# Patient Record
Sex: Male | Born: 1935 | Race: Black or African American | Hispanic: No | Smoking: Former smoker
Health system: Southern US, Community
[De-identification: ages and names within clinical notes are randomized; demographics above are authoritative.]

## PROBLEM LIST (undated history)

## (undated) DIAGNOSIS — E119 Type 2 diabetes mellitus without complications: Secondary | ICD-10-CM

## (undated) DIAGNOSIS — C169 Malignant neoplasm of stomach, unspecified: Secondary | ICD-10-CM

## (undated) DIAGNOSIS — I6529 Occlusion and stenosis of unspecified carotid artery: Secondary | ICD-10-CM

## (undated) DIAGNOSIS — J449 Chronic obstructive pulmonary disease, unspecified: Secondary | ICD-10-CM

## (undated) DIAGNOSIS — I10 Essential (primary) hypertension: Secondary | ICD-10-CM

## (undated) DIAGNOSIS — R0902 Hypoxemia: Secondary | ICD-10-CM

## (undated) DIAGNOSIS — D649 Anemia, unspecified: Secondary | ICD-10-CM

## (undated) DIAGNOSIS — N183 Chronic kidney disease, stage 3 unspecified: Secondary | ICD-10-CM

---

## 2018-05-20 ENCOUNTER — Inpatient Hospital Stay
Admission: EM | Admit: 2018-05-20 | Discharge: 2018-06-29 | Disposition: A | Discharge status: Skilled Nursing Facility | Payer: Medicare Other | Source: Other Acute Inpatient Hospital | Attending: Internal Medicine | Admitting: Internal Medicine

## 2018-05-20 DIAGNOSIS — R0603 Acute respiratory distress: Secondary | ICD-10-CM

## 2018-05-20 DIAGNOSIS — R0689 Other abnormalities of breathing: Secondary | ICD-10-CM

## 2018-05-20 DIAGNOSIS — R079 Chest pain, unspecified: Secondary | ICD-10-CM

## 2018-05-20 DIAGNOSIS — J189 Pneumonia, unspecified organism: Secondary | ICD-10-CM

## 2018-05-20 DIAGNOSIS — R0989 Other specified symptoms and signs involving the circulatory and respiratory systems: Secondary | ICD-10-CM

## 2018-05-20 DIAGNOSIS — K567 Ileus, unspecified: Secondary | ICD-10-CM

## 2018-05-20 DIAGNOSIS — R112 Nausea with vomiting, unspecified: Secondary | ICD-10-CM

## 2018-05-20 DIAGNOSIS — T17908A Unspecified foreign body in respiratory tract, part unspecified causing other injury, initial encounter: Secondary | ICD-10-CM

## 2018-05-20 DIAGNOSIS — R140 Abdominal distension (gaseous): Secondary | ICD-10-CM

## 2018-05-21 LAB — CBC WITH DIFFERENTIAL/PLATELET
Abs Immature Granulocytes: 0.03 10*3/uL (ref 0.00–0.07)
Basophils Absolute: 0 10*3/uL (ref 0.0–0.1)
Basophils Relative: 0 %
Eosinophils Absolute: 0.1 10*3/uL (ref 0.0–0.5)
Eosinophils Relative: 2 %
HCT: 31.5 % — ABNORMAL LOW (ref 39.0–52.0)
Hemoglobin: 9.8 g/dL — ABNORMAL LOW (ref 13.0–17.0)
IMMATURE GRANULOCYTES: 1 %
Lymphocytes Relative: 13 %
Lymphs Abs: 0.9 10*3/uL (ref 0.7–4.0)
MCH: 29.3 pg (ref 26.0–34.0)
MCHC: 31.1 g/dL (ref 30.0–36.0)
MCV: 94.3 fL (ref 80.0–100.0)
Monocytes Absolute: 0.8 10*3/uL (ref 0.1–1.0)
Monocytes Relative: 13 %
NEUTROS PCT: 71 %
Neutro Abs: 4.7 10*3/uL (ref 1.7–7.7)
Platelets: 424 10*3/uL — ABNORMAL HIGH (ref 150–400)
RBC: 3.34 MIL/uL — AB (ref 4.22–5.81)
RDW: 15.3 % (ref 11.5–15.5)
WBC: 6.5 10*3/uL (ref 4.0–10.5)
nRBC: 0 % (ref 0.0–0.2)

## 2018-05-21 LAB — BASIC METABOLIC PANEL
ANION GAP: 7 (ref 5–15)
BUN: 32 mg/dL — ABNORMAL HIGH (ref 8–23)
CO2: 23 mmol/L (ref 22–32)
Calcium: 8.2 mg/dL — ABNORMAL LOW (ref 8.9–10.3)
Chloride: 113 mmol/L — ABNORMAL HIGH (ref 98–111)
Creatinine, Ser: 1.3 mg/dL — ABNORMAL HIGH (ref 0.61–1.24)
GFR calc Af Amer: 59 mL/min — ABNORMAL LOW (ref >60)
GFR calc non Af Amer: 51 mL/min — ABNORMAL LOW (ref >60)
Glucose, Bld: 239 mg/dL — ABNORMAL HIGH (ref 70–99)
Potassium: 4.2 mmol/L (ref 3.5–5.1)
SODIUM: 143 mmol/L (ref 135–145)

## 2018-05-22 LAB — BASIC METABOLIC PANEL
Anion gap: 8 (ref 5–15)
BUN: 29 mg/dL — ABNORMAL HIGH (ref 8–23)
CO2: 22 mmol/L (ref 22–32)
CREATININE: 1.2 mg/dL (ref 0.61–1.24)
Calcium: 8.1 mg/dL — ABNORMAL LOW (ref 8.9–10.3)
Chloride: 111 mmol/L (ref 98–111)
GFR calc Af Amer: >60 mL/min (ref >60)
GFR calc non Af Amer: 56 mL/min — ABNORMAL LOW (ref >60)
Glucose, Bld: 215 mg/dL — ABNORMAL HIGH (ref 70–99)
Potassium: 4.3 mmol/L (ref 3.5–5.1)
Sodium: 141 mmol/L (ref 135–145)

## 2018-05-22 LAB — TRIGLYCERIDES: Triglycerides: 53 mg/dL (ref <150)

## 2018-05-22 LAB — PHOSPHORUS: Phosphorus: 2.7 mg/dL (ref 2.5–4.6)

## 2018-05-22 LAB — MAGNESIUM: Magnesium: 2.2 mg/dL (ref 1.7–2.4)

## 2018-05-23 ENCOUNTER — Other Ambulatory Visit (HOSPITAL_COMMUNITY): Payer: Medicare Other

## 2018-05-25 LAB — BASIC METABOLIC PANEL
Anion gap: 7 (ref 5–15)
BUN: 24 mg/dL — ABNORMAL HIGH (ref 8–23)
CO2: 22 mmol/L (ref 22–32)
Calcium: 8.1 mg/dL — ABNORMAL LOW (ref 8.9–10.3)
Chloride: 109 mmol/L (ref 98–111)
Creatinine, Ser: 0.99 mg/dL (ref 0.61–1.24)
Glucose, Bld: 210 mg/dL — ABNORMAL HIGH (ref 70–99)
Potassium: 4.5 mmol/L (ref 3.5–5.1)
Sodium: 138 mmol/L (ref 135–145)

## 2018-05-25 LAB — CBC
HCT: 31.1 % — ABNORMAL LOW (ref 39.0–52.0)
Hemoglobin: 9.4 g/dL — ABNORMAL LOW (ref 13.0–17.0)
MCH: 28.1 pg (ref 26.0–34.0)
MCHC: 30.2 g/dL (ref 30.0–36.0)
MCV: 93.1 fL (ref 80.0–100.0)
PLATELETS: 272 10*3/uL (ref 150–400)
RBC: 3.34 MIL/uL — ABNORMAL LOW (ref 4.22–5.81)
RDW: 14.3 % (ref 11.5–15.5)
WBC: 4.4 10*3/uL (ref 4.0–10.5)
nRBC: 0 % (ref 0.0–0.2)

## 2018-05-25 LAB — TRIGLYCERIDES: Triglycerides: 72 mg/dL (ref <150)

## 2018-05-25 LAB — PHOSPHORUS: Phosphorus: 2.7 mg/dL (ref 2.5–4.6)

## 2018-05-25 LAB — MAGNESIUM: Magnesium: 1.9 mg/dL (ref 1.7–2.4)

## 2018-05-27 LAB — RENAL FUNCTION PANEL
ANION GAP: 5 (ref 5–15)
Albumin: 1.9 g/dL — ABNORMAL LOW (ref 3.5–5.0)
BUN: 27 mg/dL — ABNORMAL HIGH (ref 8–23)
CO2: 26 mmol/L (ref 22–32)
Calcium: 8.4 mg/dL — ABNORMAL LOW (ref 8.9–10.3)
Chloride: 105 mmol/L (ref 98–111)
Creatinine, Ser: 1.24 mg/dL (ref 0.61–1.24)
GFR calc Af Amer: >60 mL/min (ref >60)
GFR calc non Af Amer: 54 mL/min — ABNORMAL LOW (ref >60)
Glucose, Bld: 182 mg/dL — ABNORMAL HIGH (ref 70–99)
Phosphorus: 3.3 mg/dL (ref 2.5–4.6)
Potassium: 4.9 mmol/L (ref 3.5–5.1)
Sodium: 136 mmol/L (ref 135–145)

## 2018-05-27 LAB — CBC
HCT: 32.2 % — ABNORMAL LOW (ref 39.0–52.0)
Hemoglobin: 10 g/dL — ABNORMAL LOW (ref 13.0–17.0)
MCH: 29.1 pg (ref 26.0–34.0)
MCHC: 31.1 g/dL (ref 30.0–36.0)
MCV: 93.6 fL (ref 80.0–100.0)
Platelets: 219 10*3/uL (ref 150–400)
RBC: 3.44 MIL/uL — ABNORMAL LOW (ref 4.22–5.81)
RDW: 14.3 % (ref 11.5–15.5)
WBC: 5.1 10*3/uL (ref 4.0–10.5)
nRBC: 0 % (ref 0.0–0.2)

## 2018-05-27 LAB — MAGNESIUM: Magnesium: 1.9 mg/dL (ref 1.7–2.4)

## 2018-05-28 LAB — BASIC METABOLIC PANEL
Anion gap: 4 — ABNORMAL LOW (ref 5–15)
BUN: 25 mg/dL — ABNORMAL HIGH (ref 8–23)
CO2: 26 mmol/L (ref 22–32)
Calcium: 8.2 mg/dL — ABNORMAL LOW (ref 8.9–10.3)
Chloride: 105 mmol/L (ref 98–111)
Creatinine, Ser: 1.09 mg/dL (ref 0.61–1.24)
GFR calc Af Amer: >60 mL/min (ref >60)
GFR calc non Af Amer: >60 mL/min (ref >60)
Glucose, Bld: 125 mg/dL — ABNORMAL HIGH (ref 70–99)
Potassium: 4.2 mmol/L (ref 3.5–5.1)
Sodium: 135 mmol/L (ref 135–145)

## 2018-05-28 LAB — TRIGLYCERIDES: Triglycerides: 49 mg/dL (ref <150)

## 2018-05-28 LAB — PHOSPHORUS: Phosphorus: 3 mg/dL (ref 2.5–4.6)

## 2018-05-28 LAB — MAGNESIUM: Magnesium: 1.8 mg/dL (ref 1.7–2.4)

## 2018-05-30 ENCOUNTER — Other Ambulatory Visit (HOSPITAL_COMMUNITY): Payer: Medicare Other

## 2018-05-31 LAB — TRIGLYCERIDES: Triglycerides: 52 mg/dL (ref <150)

## 2018-05-31 LAB — BASIC METABOLIC PANEL
Anion gap: 6 (ref 5–15)
BUN: 24 mg/dL — ABNORMAL HIGH (ref 8–23)
CO2: 26 mmol/L (ref 22–32)
Calcium: 8.3 mg/dL — ABNORMAL LOW (ref 8.9–10.3)
Chloride: 103 mmol/L (ref 98–111)
Creatinine, Ser: 1.14 mg/dL (ref 0.61–1.24)
GFR calc Af Amer: >60 mL/min (ref >60)
GFR calc non Af Amer: 60 mL/min — ABNORMAL LOW (ref >60)
Glucose, Bld: 128 mg/dL — ABNORMAL HIGH (ref 70–99)
Potassium: 4.4 mmol/L (ref 3.5–5.1)
Sodium: 135 mmol/L (ref 135–145)

## 2018-05-31 LAB — MAGNESIUM: Magnesium: 1.8 mg/dL (ref 1.7–2.4)

## 2018-05-31 LAB — PHOSPHORUS: Phosphorus: 3.3 mg/dL (ref 2.5–4.6)

## 2018-06-01 ENCOUNTER — Other Ambulatory Visit (HOSPITAL_COMMUNITY): Payer: Medicare Other

## 2018-06-01 LAB — URINALYSIS, ROUTINE W REFLEX MICROSCOPIC
Bilirubin Urine: NEGATIVE
Glucose, UA: NEGATIVE mg/dL
Hgb urine dipstick: NEGATIVE
Ketones, ur: NEGATIVE mg/dL
Leukocytes,Ua: NEGATIVE
Nitrite: NEGATIVE
Protein, ur: NEGATIVE mg/dL
Specific Gravity, Urine: 1.012 (ref 1.005–1.030)
pH: 5 (ref 5.0–8.0)

## 2018-06-02 LAB — COMPREHENSIVE METABOLIC PANEL
ALT: 26 U/L (ref 0–44)
AST: 24 U/L (ref 15–41)
Albumin: 1.8 g/dL — ABNORMAL LOW (ref 3.5–5.0)
Alkaline Phosphatase: 103 U/L (ref 38–126)
Anion gap: 8 (ref 5–15)
BUN: 23 mg/dL (ref 8–23)
CO2: 25 mmol/L (ref 22–32)
Calcium: 8.2 mg/dL — ABNORMAL LOW (ref 8.9–10.3)
Chloride: 103 mmol/L (ref 98–111)
Creatinine, Ser: 1.23 mg/dL (ref 0.61–1.24)
GFR calc Af Amer: >60 mL/min (ref >60)
GFR calc non Af Amer: 55 mL/min — ABNORMAL LOW (ref >60)
Glucose, Bld: 202 mg/dL — ABNORMAL HIGH (ref 70–99)
Potassium: 4.5 mmol/L (ref 3.5–5.1)
Sodium: 136 mmol/L (ref 135–145)
Total Bilirubin: 0.1 mg/dL — ABNORMAL LOW (ref 0.3–1.2)
Total Protein: 6 g/dL — ABNORMAL LOW (ref 6.5–8.1)

## 2018-06-02 LAB — CBC
HCT: 31.9 % — ABNORMAL LOW (ref 39.0–52.0)
Hemoglobin: 9.7 g/dL — ABNORMAL LOW (ref 13.0–17.0)
MCH: 28 pg (ref 26.0–34.0)
MCHC: 30.4 g/dL (ref 30.0–36.0)
MCV: 92.2 fL (ref 80.0–100.0)
Platelets: 196 10*3/uL (ref 150–400)
RBC: 3.46 MIL/uL — ABNORMAL LOW (ref 4.22–5.81)
RDW: 13.9 % (ref 11.5–15.5)
WBC: 5.5 10*3/uL (ref 4.0–10.5)
nRBC: 0 % (ref 0.0–0.2)

## 2018-06-03 LAB — BASIC METABOLIC PANEL
Anion gap: 7 (ref 5–15)
BUN: 26 mg/dL — ABNORMAL HIGH (ref 8–23)
CO2: 25 mmol/L (ref 22–32)
Calcium: 8.2 mg/dL — ABNORMAL LOW (ref 8.9–10.3)
Chloride: 103 mmol/L (ref 98–111)
Creatinine, Ser: 1.12 mg/dL (ref 0.61–1.24)
GFR calc Af Amer: >60 mL/min (ref >60)
GFR calc non Af Amer: >60 mL/min (ref >60)
Glucose, Bld: 153 mg/dL — ABNORMAL HIGH (ref 70–99)
Potassium: 4.1 mmol/L (ref 3.5–5.1)
Sodium: 135 mmol/L (ref 135–145)

## 2018-06-03 LAB — URINE CULTURE: Culture: >=100000 — AB

## 2018-06-03 LAB — MAGNESIUM: Magnesium: 1.8 mg/dL (ref 1.7–2.4)

## 2018-06-03 LAB — PHOSPHORUS: Phosphorus: 2.6 mg/dL (ref 2.5–4.6)

## 2018-06-03 LAB — TRIGLYCERIDES: Triglycerides: 65 mg/dL (ref <150)

## 2018-06-05 LAB — CBC
HCT: 32.9 % — ABNORMAL LOW (ref 39.0–52.0)
Hemoglobin: 10 g/dL — ABNORMAL LOW (ref 13.0–17.0)
MCH: 28.1 pg (ref 26.0–34.0)
MCHC: 30.4 g/dL (ref 30.0–36.0)
MCV: 92.4 fL (ref 80.0–100.0)
Platelets: 305 10*3/uL (ref 150–400)
RBC: 3.56 MIL/uL — ABNORMAL LOW (ref 4.22–5.81)
RDW: 13.5 % (ref 11.5–15.5)
WBC: 8.2 10*3/uL (ref 4.0–10.5)
nRBC: 0 % (ref 0.0–0.2)

## 2018-06-06 ENCOUNTER — Other Ambulatory Visit (HOSPITAL_COMMUNITY): Payer: Medicare Other

## 2018-06-06 LAB — MAGNESIUM: Magnesium: 2 mg/dL (ref 1.7–2.4)

## 2018-06-06 LAB — BASIC METABOLIC PANEL
Anion gap: 8 (ref 5–15)
BUN: 22 mg/dL (ref 8–23)
CO2: 26 mmol/L (ref 22–32)
Calcium: 8.5 mg/dL — ABNORMAL LOW (ref 8.9–10.3)
Chloride: 101 mmol/L (ref 98–111)
Creatinine, Ser: 1.09 mg/dL (ref 0.61–1.24)
GFR calc Af Amer: >60 mL/min (ref >60)
GFR calc non Af Amer: >60 mL/min (ref >60)
Glucose, Bld: 282 mg/dL — ABNORMAL HIGH (ref 70–99)
Potassium: 4.6 mmol/L (ref 3.5–5.1)
Sodium: 135 mmol/L (ref 135–145)

## 2018-06-06 LAB — TRIGLYCERIDES: Triglycerides: 62 mg/dL (ref <150)

## 2018-06-06 LAB — PHOSPHORUS: Phosphorus: 2.6 mg/dL (ref 2.5–4.6)

## 2018-06-07 LAB — CBC
HCT: 30 % — ABNORMAL LOW (ref 39.0–52.0)
Hemoglobin: 9.1 g/dL — ABNORMAL LOW (ref 13.0–17.0)
MCH: 27.7 pg (ref 26.0–34.0)
MCHC: 30.3 g/dL (ref 30.0–36.0)
MCV: 91.2 fL (ref 80.0–100.0)
Platelets: 346 10*3/uL (ref 150–400)
RBC: 3.29 MIL/uL — ABNORMAL LOW (ref 4.22–5.81)
RDW: 13.9 % (ref 11.5–15.5)
WBC: 11.5 10*3/uL — ABNORMAL HIGH (ref 4.0–10.5)
nRBC: 0 % (ref 0.0–0.2)

## 2018-06-07 LAB — COMPREHENSIVE METABOLIC PANEL
ALT: 26 U/L (ref 0–44)
AST: 48 U/L — ABNORMAL HIGH (ref 15–41)
Albumin: 1.8 g/dL — ABNORMAL LOW (ref 3.5–5.0)
Alkaline Phosphatase: 108 U/L (ref 38–126)
Anion gap: 8 (ref 5–15)
BUN: 28 mg/dL — ABNORMAL HIGH (ref 8–23)
CO2: 25 mmol/L (ref 22–32)
Calcium: 8.3 mg/dL — ABNORMAL LOW (ref 8.9–10.3)
Chloride: 104 mmol/L (ref 98–111)
Creatinine, Ser: 1.14 mg/dL (ref 0.61–1.24)
GFR calc Af Amer: >60 mL/min (ref >60)
GFR calc non Af Amer: 60 mL/min — ABNORMAL LOW (ref >60)
Glucose, Bld: 206 mg/dL — ABNORMAL HIGH (ref 70–99)
Potassium: 4.8 mmol/L (ref 3.5–5.1)
Sodium: 137 mmol/L (ref 135–145)
Total Bilirubin: 0.9 mg/dL (ref 0.3–1.2)
Total Protein: 6.1 g/dL — ABNORMAL LOW (ref 6.5–8.1)

## 2018-06-07 LAB — MAGNESIUM: Magnesium: 1.9 mg/dL (ref 1.7–2.4)

## 2018-06-08 ENCOUNTER — Other Ambulatory Visit (HOSPITAL_COMMUNITY): Payer: Medicare Other

## 2018-06-08 LAB — CULTURE, RESPIRATORY W GRAM STAIN

## 2018-06-09 LAB — TRIGLYCERIDES: Triglycerides: 140 mg/dL (ref <150)

## 2018-06-10 LAB — BASIC METABOLIC PANEL
Anion gap: 6 (ref 5–15)
BUN: 33 mg/dL — ABNORMAL HIGH (ref 8–23)
CO2: 25 mmol/L (ref 22–32)
Calcium: 8.2 mg/dL — ABNORMAL LOW (ref 8.9–10.3)
Chloride: 110 mmol/L (ref 98–111)
Creatinine, Ser: 1.06 mg/dL (ref 0.61–1.24)
GFR calc Af Amer: >60 mL/min (ref >60)
GFR calc non Af Amer: >60 mL/min (ref >60)
Glucose, Bld: 236 mg/dL — ABNORMAL HIGH (ref 70–99)
Potassium: 4.4 mmol/L (ref 3.5–5.1)
Sodium: 141 mmol/L (ref 135–145)

## 2018-06-10 LAB — CBC
HCT: 23.7 % — ABNORMAL LOW (ref 39.0–52.0)
Hemoglobin: 7.3 g/dL — ABNORMAL LOW (ref 13.0–17.0)
MCH: 27.9 pg (ref 26.0–34.0)
MCHC: 30.8 g/dL (ref 30.0–36.0)
MCV: 90.5 fL (ref 80.0–100.0)
Platelets: 360 10*3/uL (ref 150–400)
RBC: 2.62 MIL/uL — ABNORMAL LOW (ref 4.22–5.81)
RDW: 14.5 % (ref 11.5–15.5)
WBC: 11.5 10*3/uL — ABNORMAL HIGH (ref 4.0–10.5)
nRBC: 0 % (ref 0.0–0.2)

## 2018-06-10 LAB — MAGNESIUM: Magnesium: 2 mg/dL (ref 1.7–2.4)

## 2018-06-10 LAB — PHOSPHORUS: Phosphorus: 2.5 mg/dL (ref 2.5–4.6)

## 2018-06-11 LAB — CBC
HCT: 23.2 % — ABNORMAL LOW (ref 39.0–52.0)
Hemoglobin: 7.4 g/dL — ABNORMAL LOW (ref 13.0–17.0)
MCH: 28.9 pg (ref 26.0–34.0)
MCHC: 31.9 g/dL (ref 30.0–36.0)
MCV: 90.6 fL (ref 80.0–100.0)
Platelets: 362 10*3/uL (ref 150–400)
RBC: 2.56 MIL/uL — ABNORMAL LOW (ref 4.22–5.81)
RDW: 14.8 % (ref 11.5–15.5)
WBC: 11.2 10*3/uL — ABNORMAL HIGH (ref 4.0–10.5)
nRBC: 0.4 % — ABNORMAL HIGH (ref 0.0–0.2)

## 2018-06-12 LAB — COMPREHENSIVE METABOLIC PANEL
ALT: 85 U/L — ABNORMAL HIGH (ref 0–44)
AST: 49 U/L — ABNORMAL HIGH (ref 15–41)
Albumin: 1.5 g/dL — ABNORMAL LOW (ref 3.5–5.0)
Alkaline Phosphatase: 233 U/L — ABNORMAL HIGH (ref 38–126)
Anion gap: 10 (ref 5–15)
BUN: 33 mg/dL — ABNORMAL HIGH (ref 8–23)
CO2: 22 mmol/L (ref 22–32)
Calcium: 8.6 mg/dL — ABNORMAL LOW (ref 8.9–10.3)
Chloride: 106 mmol/L (ref 98–111)
Creatinine, Ser: 1.19 mg/dL (ref 0.61–1.24)
GFR calc Af Amer: >60 mL/min (ref >60)
GFR calc non Af Amer: 57 mL/min — ABNORMAL LOW (ref >60)
Glucose, Bld: 354 mg/dL — ABNORMAL HIGH (ref 70–99)
Potassium: 4 mmol/L (ref 3.5–5.1)
Sodium: 138 mmol/L (ref 135–145)
Total Bilirubin: 0.4 mg/dL (ref 0.3–1.2)
Total Protein: 6.3 g/dL — ABNORMAL LOW (ref 6.5–8.1)

## 2018-06-12 LAB — CBC
HCT: 24.2 % — ABNORMAL LOW (ref 39.0–52.0)
Hemoglobin: 7.4 g/dL — ABNORMAL LOW (ref 13.0–17.0)
MCH: 27.5 pg (ref 26.0–34.0)
MCHC: 30.6 g/dL (ref 30.0–36.0)
MCV: 90 fL (ref 80.0–100.0)
Platelets: 392 10*3/uL (ref 150–400)
RBC: 2.69 MIL/uL — ABNORMAL LOW (ref 4.22–5.81)
RDW: 15 % (ref 11.5–15.5)
WBC: 10.6 10*3/uL — ABNORMAL HIGH (ref 4.0–10.5)
nRBC: 0.4 % — ABNORMAL HIGH (ref 0.0–0.2)

## 2018-06-12 LAB — MAGNESIUM: Magnesium: 2 mg/dL (ref 1.7–2.4)

## 2018-06-12 LAB — PHOSPHORUS: Phosphorus: 2.6 mg/dL (ref 2.5–4.6)

## 2018-06-14 ENCOUNTER — Other Ambulatory Visit (HOSPITAL_COMMUNITY): Payer: Medicare Other

## 2018-06-14 LAB — BLOOD GAS, ARTERIAL
Acid-Base Excess: 1.7 mmol/L (ref 0.0–2.0)
Bicarbonate: 26.1 mmol/L (ref 20.0–28.0)
O2 Content: 6 L/min
O2 Saturation: 96.3 %
Patient temperature: 96.1
pCO2 arterial: 40.6 mmHg (ref 32.0–48.0)
pH, Arterial: 7.417 (ref 7.350–7.450)
pO2, Arterial: 79.2 mmHg — ABNORMAL LOW (ref 83.0–108.0)

## 2018-06-16 LAB — BASIC METABOLIC PANEL
Anion gap: 9 (ref 5–15)
BUN: 39 mg/dL — ABNORMAL HIGH (ref 8–23)
CO2: 27 mmol/L (ref 22–32)
Calcium: 8.9 mg/dL (ref 8.9–10.3)
Chloride: 105 mmol/L (ref 98–111)
Creatinine, Ser: 1.19 mg/dL (ref 0.61–1.24)
GFR calc Af Amer: >60 mL/min (ref >60)
GFR calc non Af Amer: 57 mL/min — ABNORMAL LOW (ref >60)
Glucose, Bld: 244 mg/dL — ABNORMAL HIGH (ref 70–99)
Potassium: 4.6 mmol/L (ref 3.5–5.1)
Sodium: 141 mmol/L (ref 135–145)

## 2018-06-16 LAB — CBC
HCT: 24 % — ABNORMAL LOW (ref 39.0–52.0)
Hemoglobin: 7.2 g/dL — ABNORMAL LOW (ref 13.0–17.0)
MCH: 27.4 pg (ref 26.0–34.0)
MCHC: 30 g/dL (ref 30.0–36.0)
MCV: 91.3 fL (ref 80.0–100.0)
Platelets: 330 10*3/uL (ref 150–400)
RBC: 2.63 MIL/uL — ABNORMAL LOW (ref 4.22–5.81)
RDW: 15.8 % — ABNORMAL HIGH (ref 11.5–15.5)
WBC: 8.1 10*3/uL (ref 4.0–10.5)
nRBC: 1 % — ABNORMAL HIGH (ref 0.0–0.2)

## 2018-06-16 LAB — MAGNESIUM: Magnesium: 1.8 mg/dL (ref 1.7–2.4)

## 2018-06-18 ENCOUNTER — Other Ambulatory Visit (HOSPITAL_COMMUNITY): Payer: Medicare Other

## 2018-06-18 LAB — BASIC METABOLIC PANEL
Anion gap: 9 (ref 5–15)
BUN: 45 mg/dL — ABNORMAL HIGH (ref 8–23)
CO2: 29 mmol/L (ref 22–32)
Calcium: 8.9 mg/dL (ref 8.9–10.3)
Chloride: 102 mmol/L (ref 98–111)
Creatinine, Ser: 1.29 mg/dL — ABNORMAL HIGH (ref 0.61–1.24)
GFR calc Af Amer: 60 mL/min — ABNORMAL LOW (ref >60)
GFR calc non Af Amer: 52 mL/min — ABNORMAL LOW (ref >60)
Glucose, Bld: 387 mg/dL — ABNORMAL HIGH (ref 70–99)
Potassium: 4.8 mmol/L (ref 3.5–5.1)
Sodium: 140 mmol/L (ref 135–145)

## 2018-06-18 LAB — CBC
HCT: 22.4 % — ABNORMAL LOW (ref 39.0–52.0)
Hemoglobin: 6.9 g/dL — CL (ref 13.0–17.0)
MCH: 28 pg (ref 26.0–34.0)
MCHC: 30.8 g/dL (ref 30.0–36.0)
MCV: 91.1 fL (ref 80.0–100.0)
Platelets: 303 10*3/uL (ref 150–400)
RBC: 2.46 MIL/uL — ABNORMAL LOW (ref 4.22–5.81)
RDW: 16.1 % — ABNORMAL HIGH (ref 11.5–15.5)
WBC: 8.1 10*3/uL (ref 4.0–10.5)
nRBC: 0.9 % — ABNORMAL HIGH (ref 0.0–0.2)

## 2018-06-18 LAB — PREPARE RBC (CROSSMATCH)

## 2018-06-18 LAB — ABO/RH: ABO/RH(D): O POS

## 2018-06-19 LAB — CBC
HCT: 27.5 % — ABNORMAL LOW (ref 39.0–52.0)
Hemoglobin: 8.6 g/dL — ABNORMAL LOW (ref 13.0–17.0)
MCH: 28 pg (ref 26.0–34.0)
MCHC: 31.3 g/dL (ref 30.0–36.0)
MCV: 89.6 fL (ref 80.0–100.0)
Platelets: 297 10*3/uL (ref 150–400)
RBC: 3.07 MIL/uL — ABNORMAL LOW (ref 4.22–5.81)
RDW: 15.7 % — ABNORMAL HIGH (ref 11.5–15.5)
WBC: 9.3 10*3/uL (ref 4.0–10.5)
nRBC: 0.6 % — ABNORMAL HIGH (ref 0.0–0.2)

## 2018-06-19 LAB — BASIC METABOLIC PANEL
Anion gap: 7 (ref 5–15)
BUN: 43 mg/dL — ABNORMAL HIGH (ref 8–23)
CO2: 31 mmol/L (ref 22–32)
Calcium: 9.1 mg/dL (ref 8.9–10.3)
Chloride: 107 mmol/L (ref 98–111)
Creatinine, Ser: 1.21 mg/dL (ref 0.61–1.24)
GFR calc Af Amer: >60 mL/min (ref >60)
GFR calc non Af Amer: 56 mL/min — ABNORMAL LOW (ref >60)
Glucose, Bld: 116 mg/dL — ABNORMAL HIGH (ref 70–99)
Potassium: 4.7 mmol/L (ref 3.5–5.1)
Sodium: 145 mmol/L (ref 135–145)

## 2018-06-19 LAB — TYPE AND SCREEN
ABO/RH(D): O POS
Antibody Screen: NEGATIVE
Unit division: 0

## 2018-06-19 LAB — BPAM RBC
Blood Product Expiration Date: 202004302359
ISSUE DATE / TIME: 202004231100
Unit Type and Rh: 5100

## 2018-06-20 LAB — OCCULT BLOOD X 1 CARD TO LAB, STOOL: Fecal Occult Bld: NEGATIVE

## 2018-06-22 LAB — BASIC METABOLIC PANEL
Anion gap: 7 (ref 5–15)
BUN: 32 mg/dL — ABNORMAL HIGH (ref 8–23)
CO2: 30 mmol/L (ref 22–32)
Calcium: 8.6 mg/dL — ABNORMAL LOW (ref 8.9–10.3)
Chloride: 102 mmol/L (ref 98–111)
Creatinine, Ser: 1.1 mg/dL (ref 0.61–1.24)
GFR calc Af Amer: >60 mL/min (ref >60)
GFR calc non Af Amer: >60 mL/min (ref >60)
Glucose, Bld: 149 mg/dL — ABNORMAL HIGH (ref 70–99)
Potassium: 4.7 mmol/L (ref 3.5–5.1)
Sodium: 139 mmol/L (ref 135–145)

## 2018-06-22 LAB — CBC
HCT: 28.2 % — ABNORMAL LOW (ref 39.0–52.0)
Hemoglobin: 8.6 g/dL — ABNORMAL LOW (ref 13.0–17.0)
MCH: 27.6 pg (ref 26.0–34.0)
MCHC: 30.5 g/dL (ref 30.0–36.0)
MCV: 90.4 fL (ref 80.0–100.0)
Platelets: 273 10*3/uL (ref 150–400)
RBC: 3.12 MIL/uL — ABNORMAL LOW (ref 4.22–5.81)
RDW: 14.6 % (ref 11.5–15.5)
WBC: 8.6 10*3/uL (ref 4.0–10.5)
nRBC: 0 % (ref 0.0–0.2)

## 2018-06-22 LAB — MAGNESIUM: Magnesium: 1.8 mg/dL (ref 1.7–2.4)

## 2018-06-23 LAB — COMPREHENSIVE METABOLIC PANEL
ALT: 63 U/L — ABNORMAL HIGH (ref 0–44)
AST: 34 U/L (ref 15–41)
Albumin: 1.8 g/dL — ABNORMAL LOW (ref 3.5–5.0)
Alkaline Phosphatase: 217 U/L — ABNORMAL HIGH (ref 38–126)
Anion gap: 8 (ref 5–15)
BUN: 27 mg/dL — ABNORMAL HIGH (ref 8–23)
CO2: 30 mmol/L (ref 22–32)
Calcium: 9 mg/dL (ref 8.9–10.3)
Chloride: 99 mmol/L (ref 98–111)
Creatinine, Ser: 1.01 mg/dL (ref 0.61–1.24)
GFR calc Af Amer: >60 mL/min (ref >60)
GFR calc non Af Amer: >60 mL/min (ref >60)
Glucose, Bld: 169 mg/dL — ABNORMAL HIGH (ref 70–99)
Potassium: 4.8 mmol/L (ref 3.5–5.1)
Sodium: 137 mmol/L (ref 135–145)
Total Bilirubin: 0.5 mg/dL (ref 0.3–1.2)
Total Protein: 7.3 g/dL (ref 6.5–8.1)

## 2018-06-24 LAB — CBC
HCT: 30.4 % — ABNORMAL LOW (ref 39.0–52.0)
Hemoglobin: 9.4 g/dL — ABNORMAL LOW (ref 13.0–17.0)
MCH: 27.9 pg (ref 26.0–34.0)
MCHC: 30.9 g/dL (ref 30.0–36.0)
MCV: 90.2 fL (ref 80.0–100.0)
Platelets: 318 10*3/uL (ref 150–400)
RBC: 3.37 MIL/uL — ABNORMAL LOW (ref 4.22–5.81)
RDW: 14.4 % (ref 11.5–15.5)
WBC: 9.3 10*3/uL (ref 4.0–10.5)
nRBC: 0 % (ref 0.0–0.2)

## 2018-06-24 LAB — LIPID PANEL
Cholesterol: 160 mg/dL (ref 0–200)
HDL: 19 mg/dL — ABNORMAL LOW (ref >40)
LDL Cholesterol: 125 mg/dL — ABNORMAL HIGH (ref 0–99)
Total CHOL/HDL Ratio: 8.4 RATIO
Triglycerides: 80 mg/dL (ref <150)
VLDL: 16 mg/dL (ref 0–40)

## 2018-06-24 LAB — MAGNESIUM: Magnesium: 1.8 mg/dL (ref 1.7–2.4)

## 2018-06-24 LAB — PHOSPHORUS: Phosphorus: 3.6 mg/dL (ref 2.5–4.6)

## 2018-06-26 ENCOUNTER — Other Ambulatory Visit (HOSPITAL_COMMUNITY): Payer: Medicare Other

## 2018-06-26 LAB — TROPONIN I: Troponin I: <0.03 ng/mL (ref <0.03)

## 2018-06-26 LAB — BASIC METABOLIC PANEL
Anion gap: 8 (ref 5–15)
BUN: 31 mg/dL — ABNORMAL HIGH (ref 8–23)
CO2: 28 mmol/L (ref 22–32)
Calcium: 9 mg/dL (ref 8.9–10.3)
Chloride: 100 mmol/L (ref 98–111)
Creatinine, Ser: 0.96 mg/dL (ref 0.61–1.24)
GFR calc Af Amer: >60 mL/min (ref >60)
GFR calc non Af Amer: >60 mL/min (ref >60)
Glucose, Bld: 276 mg/dL — ABNORMAL HIGH (ref 70–99)
Potassium: 5 mmol/L (ref 3.5–5.1)
Sodium: 136 mmol/L (ref 135–145)

## 2018-06-26 LAB — CK TOTAL AND CKMB (NOT AT ARMC)
CK, MB: 2.3 ng/mL (ref 0.5–5.0)
Relative Index: INVALID (ref 0.0–2.5)
Total CK: 31 U/L — ABNORMAL LOW (ref 49–397)

## 2018-06-28 LAB — MAGNESIUM: Magnesium: 1.6 mg/dL — ABNORMAL LOW (ref 1.7–2.4)

## 2018-06-29 LAB — CBC
HCT: 27.7 % — ABNORMAL LOW (ref 39.0–52.0)
Hemoglobin: 8.5 g/dL — ABNORMAL LOW (ref 13.0–17.0)
MCH: 27.3 pg (ref 26.0–34.0)
MCHC: 30.7 g/dL (ref 30.0–36.0)
MCV: 89.1 fL (ref 80.0–100.0)
Platelets: 396 10*3/uL (ref 150–400)
RBC: 3.11 MIL/uL — ABNORMAL LOW (ref 4.22–5.81)
RDW: 14.3 % (ref 11.5–15.5)
WBC: 6.8 10*3/uL (ref 4.0–10.5)
nRBC: 0 % (ref 0.0–0.2)

## 2018-06-29 LAB — MAGNESIUM: Magnesium: 2.1 mg/dL (ref 1.7–2.4)

## 2018-06-29 LAB — BASIC METABOLIC PANEL
Anion gap: 6 (ref 5–15)
BUN: 30 mg/dL — ABNORMAL HIGH (ref 8–23)
CO2: 29 mmol/L (ref 22–32)
Calcium: 8.7 mg/dL — ABNORMAL LOW (ref 8.9–10.3)
Chloride: 101 mmol/L (ref 98–111)
Creatinine, Ser: 0.96 mg/dL (ref 0.61–1.24)
GFR calc Af Amer: >60 mL/min (ref >60)
GFR calc non Af Amer: >60 mL/min (ref >60)
Glucose, Bld: 118 mg/dL — ABNORMAL HIGH (ref 70–99)
Potassium: 4.4 mmol/L (ref 3.5–5.1)
Sodium: 136 mmol/L (ref 135–145)

## 2018-06-29 LAB — PHOSPHORUS: Phosphorus: 3.8 mg/dL (ref 2.5–4.6)

## 2018-07-16 ENCOUNTER — Emergency Department (HOSPITAL_COMMUNITY): Payer: Medicare Other

## 2018-07-16 ENCOUNTER — Inpatient Hospital Stay (HOSPITAL_COMMUNITY)
Admission: EM | Admit: 2018-07-16 | Discharge: 2018-07-21 | DRG: 871 | Disposition: A | Discharge status: Skilled Nursing Facility | Payer: Medicare Other | Attending: Internal Medicine | Admitting: Internal Medicine

## 2018-07-16 ENCOUNTER — Encounter (HOSPITAL_COMMUNITY): Payer: Self-pay | Admitting: Emergency Medicine

## 2018-07-16 ENCOUNTER — Other Ambulatory Visit: Payer: Self-pay

## 2018-07-16 DIAGNOSIS — R739 Hyperglycemia, unspecified: Secondary | ICD-10-CM

## 2018-07-16 DIAGNOSIS — I129 Hypertensive chronic kidney disease with stage 1 through stage 4 chronic kidney disease, or unspecified chronic kidney disease: Secondary | ICD-10-CM | POA: Diagnosis present

## 2018-07-16 DIAGNOSIS — J440 Chronic obstructive pulmonary disease with acute lower respiratory infection: Secondary | ICD-10-CM | POA: Diagnosis present

## 2018-07-16 DIAGNOSIS — I10 Essential (primary) hypertension: Secondary | ICD-10-CM | POA: Diagnosis present

## 2018-07-16 DIAGNOSIS — G9341 Metabolic encephalopathy: Secondary | ICD-10-CM | POA: Diagnosis present

## 2018-07-16 DIAGNOSIS — Z7189 Other specified counseling: Secondary | ICD-10-CM | POA: Diagnosis not present

## 2018-07-16 DIAGNOSIS — I472 Ventricular tachycardia: Secondary | ICD-10-CM | POA: Diagnosis present

## 2018-07-16 DIAGNOSIS — Z85028 Personal history of other malignant neoplasm of stomach: Secondary | ICD-10-CM | POA: Diagnosis not present

## 2018-07-16 DIAGNOSIS — R4182 Altered mental status, unspecified: Secondary | ICD-10-CM

## 2018-07-16 DIAGNOSIS — Z20828 Contact with and (suspected) exposure to other viral communicable diseases: Secondary | ICD-10-CM | POA: Diagnosis present

## 2018-07-16 DIAGNOSIS — Z888 Allergy status to other drugs, medicaments and biological substances status: Secondary | ICD-10-CM | POA: Diagnosis not present

## 2018-07-16 DIAGNOSIS — J9621 Acute and chronic respiratory failure with hypoxia: Secondary | ICD-10-CM | POA: Diagnosis present

## 2018-07-16 DIAGNOSIS — E1122 Type 2 diabetes mellitus with diabetic chronic kidney disease: Secondary | ICD-10-CM | POA: Diagnosis present

## 2018-07-16 DIAGNOSIS — Z903 Acquired absence of stomach [part of]: Secondary | ICD-10-CM | POA: Diagnosis not present

## 2018-07-16 DIAGNOSIS — R0600 Dyspnea, unspecified: Secondary | ICD-10-CM

## 2018-07-16 DIAGNOSIS — J441 Chronic obstructive pulmonary disease with (acute) exacerbation: Secondary | ICD-10-CM | POA: Diagnosis present

## 2018-07-16 DIAGNOSIS — N183 Chronic kidney disease, stage 3 unspecified: Secondary | ICD-10-CM | POA: Diagnosis present

## 2018-07-16 DIAGNOSIS — E1165 Type 2 diabetes mellitus with hyperglycemia: Secondary | ICD-10-CM | POA: Diagnosis present

## 2018-07-16 DIAGNOSIS — J189 Pneumonia, unspecified organism: Secondary | ICD-10-CM | POA: Diagnosis present

## 2018-07-16 DIAGNOSIS — A419 Sepsis, unspecified organism: Principal | ICD-10-CM

## 2018-07-16 DIAGNOSIS — I4729 Other ventricular tachycardia: Secondary | ICD-10-CM

## 2018-07-16 DIAGNOSIS — R0602 Shortness of breath: Secondary | ICD-10-CM | POA: Diagnosis present

## 2018-07-16 DIAGNOSIS — R7881 Bacteremia: Secondary | ICD-10-CM | POA: Diagnosis not present

## 2018-07-16 DIAGNOSIS — Z794 Long term (current) use of insulin: Secondary | ICD-10-CM

## 2018-07-16 DIAGNOSIS — Z91041 Radiographic dye allergy status: Secondary | ICD-10-CM

## 2018-07-16 DIAGNOSIS — J439 Emphysema, unspecified: Secondary | ICD-10-CM | POA: Insufficient documentation

## 2018-07-16 DIAGNOSIS — Z931 Gastrostomy status: Secondary | ICD-10-CM | POA: Diagnosis not present

## 2018-07-16 DIAGNOSIS — Z9981 Dependence on supplemental oxygen: Secondary | ICD-10-CM

## 2018-07-16 DIAGNOSIS — R0603 Acute respiratory distress: Secondary | ICD-10-CM

## 2018-07-16 DIAGNOSIS — Z515 Encounter for palliative care: Secondary | ICD-10-CM | POA: Diagnosis not present

## 2018-07-16 HISTORY — DX: Hypoxemia: R09.02

## 2018-07-16 HISTORY — DX: Type 2 diabetes mellitus without complications: E11.9

## 2018-07-16 HISTORY — DX: Anemia, unspecified: D64.9

## 2018-07-16 HISTORY — DX: Occlusion and stenosis of unspecified carotid artery: I65.29

## 2018-07-16 HISTORY — DX: Essential (primary) hypertension: I10

## 2018-07-16 HISTORY — DX: Malignant neoplasm of stomach, unspecified: C16.9

## 2018-07-16 HISTORY — DX: Chronic obstructive pulmonary disease, unspecified: J44.9

## 2018-07-16 HISTORY — DX: Chronic kidney disease, stage 3 unspecified: N18.30

## 2018-07-16 LAB — POCT I-STAT 7, (LYTES, BLD GAS, ICA,H+H)
Acid-Base Excess: 1 mmol/L (ref 0.0–2.0)
Bicarbonate: 27.5 mmol/L (ref 20.0–28.0)
Calcium, Ion: 1.29 mmol/L (ref 1.15–1.40)
HCT: 29 % — ABNORMAL LOW (ref 39.0–52.0)
Hemoglobin: 9.9 g/dL — ABNORMAL LOW (ref 13.0–17.0)
O2 Saturation: 96 %
Patient temperature: 100.1
Potassium: 4.7 mmol/L (ref 3.5–5.1)
Sodium: 137 mmol/L (ref 135–145)
TCO2: 29 mmol/L (ref 22–32)
pCO2 arterial: 56.3 mmHg — ABNORMAL HIGH (ref 32.0–48.0)
pH, Arterial: 7.301 — ABNORMAL LOW (ref 7.350–7.450)
pO2, Arterial: 99 mmHg (ref 83.0–108.0)

## 2018-07-16 LAB — CBC WITH DIFFERENTIAL/PLATELET
Abs Immature Granulocytes: 0.1 10*3/uL — ABNORMAL HIGH (ref 0.00–0.07)
Basophils Absolute: 0 10*3/uL (ref 0.0–0.1)
Basophils Relative: 0 %
Eosinophils Absolute: 0.1 10*3/uL (ref 0.0–0.5)
Eosinophils Relative: 1 %
HCT: 32.3 % — ABNORMAL LOW (ref 39.0–52.0)
Hemoglobin: 9.5 g/dL — ABNORMAL LOW (ref 13.0–17.0)
Immature Granulocytes: 1 %
Lymphocytes Relative: 8 %
Lymphs Abs: 1 10*3/uL (ref 0.7–4.0)
MCH: 26.3 pg (ref 26.0–34.0)
MCHC: 29.4 g/dL — ABNORMAL LOW (ref 30.0–36.0)
MCV: 89.5 fL (ref 80.0–100.0)
Monocytes Absolute: 1.3 10*3/uL — ABNORMAL HIGH (ref 0.1–1.0)
Monocytes Relative: 10 %
Neutro Abs: 10.5 10*3/uL — ABNORMAL HIGH (ref 1.7–7.7)
Neutrophils Relative %: 80 %
Platelets: 372 10*3/uL (ref 150–400)
RBC: 3.61 MIL/uL — ABNORMAL LOW (ref 4.22–5.81)
RDW: 14.6 % (ref 11.5–15.5)
WBC: 13.1 10*3/uL — ABNORMAL HIGH (ref 4.0–10.5)
nRBC: 0 % (ref 0.0–0.2)

## 2018-07-16 LAB — PROTIME-INR
INR: 1.2 (ref 0.8–1.2)
Prothrombin Time: 14.7 seconds (ref 11.4–15.2)

## 2018-07-16 LAB — URINALYSIS, ROUTINE W REFLEX MICROSCOPIC
Bacteria, UA: NONE SEEN
Bilirubin Urine: NEGATIVE
Glucose, UA: 150 mg/dL — AB
Hgb urine dipstick: NEGATIVE
Ketones, ur: NEGATIVE mg/dL
Leukocytes,Ua: NEGATIVE
Nitrite: NEGATIVE
Protein, ur: 30 mg/dL — AB
Specific Gravity, Urine: 1.018 (ref 1.005–1.030)
pH: 5 (ref 5.0–8.0)

## 2018-07-16 LAB — BRAIN NATRIURETIC PEPTIDE: B Natriuretic Peptide: 142.9 pg/mL — ABNORMAL HIGH (ref 0.0–100.0)

## 2018-07-16 LAB — COMPREHENSIVE METABOLIC PANEL
ALT: 41 U/L (ref 0–44)
AST: 20 U/L (ref 15–41)
Albumin: 2 g/dL — ABNORMAL LOW (ref 3.5–5.0)
Alkaline Phosphatase: 148 U/L — ABNORMAL HIGH (ref 38–126)
Anion gap: 8 (ref 5–15)
BUN: 25 mg/dL — ABNORMAL HIGH (ref 8–23)
CO2: 27 mmol/L (ref 22–32)
Calcium: 9 mg/dL (ref 8.9–10.3)
Chloride: 103 mmol/L (ref 98–111)
Creatinine, Ser: 1.12 mg/dL (ref 0.61–1.24)
GFR calc Af Amer: >60 mL/min (ref >60)
GFR calc non Af Amer: >60 mL/min (ref >60)
Glucose, Bld: 323 mg/dL — ABNORMAL HIGH (ref 70–99)
Potassium: 5.6 mmol/L — ABNORMAL HIGH (ref 3.5–5.1)
Sodium: 138 mmol/L (ref 135–145)
Total Bilirubin: 0.1 mg/dL — ABNORMAL LOW (ref 0.3–1.2)
Total Protein: 7.9 g/dL (ref 6.5–8.1)

## 2018-07-16 LAB — SARS CORONAVIRUS 2 BY RT PCR (HOSPITAL ORDER, PERFORMED IN ~~LOC~~ HOSPITAL LAB): SARS Coronavirus 2: NEGATIVE

## 2018-07-16 LAB — LIPASE, BLOOD: Lipase: 51 U/L (ref 11–51)

## 2018-07-16 LAB — TROPONIN I: Troponin I: <0.03 ng/mL (ref <0.03)

## 2018-07-16 LAB — GLUCOSE, CAPILLARY: Glucose-Capillary: 201 mg/dL — ABNORMAL HIGH (ref 70–99)

## 2018-07-16 LAB — LACTIC ACID, PLASMA: Lactic Acid, Venous: 1.4 mmol/L (ref 0.5–1.9)

## 2018-07-16 MED ORDER — PROPOFOL 1000 MG/100ML IV EMUL: 5.0000 ug/kg/min | INTRAVENOUS | Status: DC

## 2018-07-16 MED ORDER — ROCURONIUM BROMIDE 50 MG/5ML IV SOLN
80.0000 mg | Freq: Once | INTRAVENOUS | Status: DC
  Filled 2018-07-16: qty 8

## 2018-07-16 MED ORDER — SODIUM CHLORIDE 0.9% FLUSH
3.0000 mL | Freq: Once | INTRAVENOUS | Status: AC
  Administered 2018-07-16: 3 mL via INTRAVENOUS

## 2018-07-16 MED ORDER — METHYLPREDNISOLONE SODIUM SUCC 125 MG IJ SOLR
125.0000 mg | Freq: Once | INTRAMUSCULAR | Status: AC
  Administered 2018-07-16: 125 mg via INTRAVENOUS
  Filled 2018-07-16: qty 2

## 2018-07-16 MED ORDER — ENOXAPARIN SODIUM 80 MG/0.8ML ~~LOC~~ SOLN
80.0000 mg | Freq: Two times a day (BID) | SUBCUTANEOUS | Status: DC
  Administered 2018-07-16 – 2018-07-17 (×2): 80 mg via SUBCUTANEOUS
  Filled 2018-07-16 (×2): qty 0.8

## 2018-07-16 MED ORDER — UMECLIDINIUM-VILANTEROL 62.5-25 MCG/INH IN AEPB
1.0000 | INHALATION_SPRAY | Freq: Every day | RESPIRATORY_TRACT | Status: DC
  Administered 2018-07-17 – 2018-07-21 (×5): 1 via RESPIRATORY_TRACT
  Filled 2018-07-16: qty 14

## 2018-07-16 MED ORDER — ALBUTEROL SULFATE 1.25 MG/3ML IN NEBU: 1.0000 | INHALATION_SOLUTION | Freq: Four times a day (QID) | RESPIRATORY_TRACT | Status: DC | PRN

## 2018-07-16 MED ORDER — ETOMIDATE 2 MG/ML IV SOLN: 20.0000 mg | Freq: Once | INTRAVENOUS | Status: DC

## 2018-07-16 MED ORDER — METOCLOPRAMIDE HCL 10 MG PO TABS
5.0000 mg | ORAL_TABLET | Freq: Four times a day (QID) | ORAL | Status: DC
  Administered 2018-07-17 – 2018-07-21 (×18): 5 mg via ORAL
  Filled 2018-07-16 (×18): qty 1

## 2018-07-16 MED ORDER — GUAIFENESIN 100 MG/5ML PO SOLN
5.0000 mL | Freq: Four times a day (QID) | ORAL | Status: DC
  Administered 2018-07-17 – 2018-07-21 (×18): 100 mg via ORAL
  Filled 2018-07-16 (×18): qty 5

## 2018-07-16 MED ORDER — ALBUTEROL SULFATE (2.5 MG/3ML) 0.083% IN NEBU: 2.5000 mg | INHALATION_SOLUTION | Freq: Four times a day (QID) | RESPIRATORY_TRACT | Status: DC | PRN

## 2018-07-16 MED ORDER — MAGIC MOUTHWASH
5.0000 mL | Freq: Four times a day (QID) | ORAL | Status: DC
  Administered 2018-07-17 – 2018-07-21 (×19): 5 mL via ORAL
  Filled 2018-07-16 (×22): qty 5

## 2018-07-16 MED ORDER — PANTOPRAZOLE SODIUM 20 MG PO TBEC
20.0000 mg | DELAYED_RELEASE_TABLET | Freq: Two times a day (BID) | ORAL | Status: DC
  Administered 2018-07-17 – 2018-07-21 (×9): 20 mg via ORAL
  Filled 2018-07-16 (×9): qty 1

## 2018-07-16 MED ORDER — ETOMIDATE 2 MG/ML IV SOLN: 0.3000 mg/kg | Freq: Once | INTRAVENOUS | Status: DC

## 2018-07-16 MED ORDER — STERILE WATER FOR INJECTION IV SOLN: 70.8000 mL | INTRAVENOUS | Status: DC

## 2018-07-16 MED ORDER — SODIUM CHLORIDE 0.9 % IV SOLN
INTRAVENOUS | Status: DC
  Administered 2018-07-16 – 2018-07-18 (×4): via INTRAVENOUS

## 2018-07-16 MED ORDER — PIPERACILLIN-TAZOBACTAM 3.375 G IVPB 30 MIN
3.3750 g | Freq: Once | INTRAVENOUS | Status: AC
  Administered 2018-07-16: 3.375 g via INTRAVENOUS
  Filled 2018-07-16: qty 50

## 2018-07-16 MED ORDER — ONDANSETRON HCL 4 MG PO TABS: 4.0000 mg | ORAL_TABLET | Freq: Four times a day (QID) | ORAL | Status: DC | PRN

## 2018-07-16 MED ORDER — METOPROLOL TARTRATE 12.5 MG HALF TABLET
12.5000 mg | ORAL_TABLET | Freq: Two times a day (BID) | ORAL | Status: DC
  Administered 2018-07-17 – 2018-07-21 (×9): 12.5 mg via ORAL
  Filled 2018-07-16 (×9): qty 1

## 2018-07-16 MED ORDER — METHYLPREDNISOLONE SODIUM SUCC 125 MG IJ SOLR
125.0000 mg | Freq: Four times a day (QID) | INTRAMUSCULAR | Status: DC
  Administered 2018-07-16 – 2018-07-17 (×3): 125 mg via INTRAVENOUS
  Filled 2018-07-16 (×3): qty 2

## 2018-07-16 MED ORDER — VANCOMYCIN HCL 10 G IV SOLR
1500.0000 mg | Freq: Once | INTRAVENOUS | Status: AC
  Administered 2018-07-16: 1500 mg via INTRAVENOUS
  Filled 2018-07-16: qty 1500

## 2018-07-16 MED ORDER — SODIUM CHLORIDE 0.9 % IV SOLN: 1.0000 g | INTRAVENOUS | Status: DC

## 2018-07-16 MED ORDER — INSULIN ASPART 100 UNIT/ML ~~LOC~~ SOLN
0.0000 [IU] | Freq: Every day | SUBCUTANEOUS | Status: DC
  Administered 2018-07-16: 2 [IU] via SUBCUTANEOUS
  Administered 2018-07-20: 4 [IU] via SUBCUTANEOUS

## 2018-07-16 MED ORDER — LATANOPROST 0.005 % OP SOLN
1.0000 [drp] | Freq: Every day | OPHTHALMIC | Status: DC
  Administered 2018-07-16 – 2018-07-20 (×5): 1 [drp] via OPHTHALMIC
  Filled 2018-07-16: qty 2.5

## 2018-07-16 MED ORDER — IOHEXOL 350 MG/ML SOLN
100.0000 mL | Freq: Once | INTRAVENOUS | Status: AC | PRN
  Administered 2018-07-16: 100 mL via INTRAVENOUS

## 2018-07-16 MED ORDER — ALBUTEROL SULFATE HFA 108 (90 BASE) MCG/ACT IN AERS
4.0000 | INHALATION_SPRAY | RESPIRATORY_TRACT | Status: AC
  Administered 2018-07-16 (×2): 4 via RESPIRATORY_TRACT
  Filled 2018-07-16: qty 6.7

## 2018-07-16 MED ORDER — INDACATEROL-GLYCOPYRROLATE 27.5-15.6 MCG IN CAPS: 1.0000 | ORAL_CAPSULE | Freq: Two times a day (BID) | RESPIRATORY_TRACT | Status: DC

## 2018-07-16 MED ORDER — LIDOCAINE 5 % EX PTCH
1.0000 | MEDICATED_PATCH | Freq: Every day | CUTANEOUS | Status: DC
  Administered 2018-07-18 – 2018-07-21 (×4): 1 via TRANSDERMAL
  Filled 2018-07-16 (×5): qty 1

## 2018-07-16 MED ORDER — IPRATROPIUM-ALBUTEROL 0.5-2.5 (3) MG/3ML IN SOLN
3.0000 mL | Freq: Three times a day (TID) | RESPIRATORY_TRACT | Status: DC
  Administered 2018-07-16 – 2018-07-17 (×3): 3 mL via RESPIRATORY_TRACT
  Filled 2018-07-16 (×3): qty 3

## 2018-07-16 MED ORDER — ACETAMINOPHEN 325 MG PO TABS: 650.0000 mg | ORAL_TABLET | Freq: Four times a day (QID) | ORAL | Status: DC | PRN

## 2018-07-16 MED ORDER — INSULIN ASPART 100 UNIT/ML ~~LOC~~ SOLN
0.0000 [IU] | Freq: Three times a day (TID) | SUBCUTANEOUS | Status: DC
  Administered 2018-07-17 (×2): 3 [IU] via SUBCUTANEOUS
  Administered 2018-07-17: 2 [IU] via SUBCUTANEOUS
  Administered 2018-07-18: 3 [IU] via SUBCUTANEOUS
  Administered 2018-07-18: 5 [IU] via SUBCUTANEOUS
  Administered 2018-07-20: 3 [IU] via SUBCUTANEOUS
  Administered 2018-07-20: 2 [IU] via SUBCUTANEOUS
  Administered 2018-07-21: 3 [IU] via SUBCUTANEOUS
  Administered 2018-07-21: 11 [IU] via SUBCUTANEOUS
  Administered 2018-07-21: 15 [IU] via SUBCUTANEOUS

## 2018-07-16 MED ORDER — SODIUM CHLORIDE 0.9 % IV SOLN
500.0000 mg | INTRAVENOUS | Status: DC
  Administered 2018-07-16: 500 mg via INTRAVENOUS
  Filled 2018-07-16 (×2): qty 500

## 2018-07-16 MED ORDER — PIPERACILLIN-TAZOBACTAM 3.375 G IVPB
3.3750 g | Freq: Three times a day (TID) | INTRAVENOUS | Status: DC
  Administered 2018-07-17 – 2018-07-18 (×5): 3.375 g via INTRAVENOUS
  Filled 2018-07-16 (×3): qty 50

## 2018-07-16 MED ORDER — INSULIN GLARGINE 100 UNIT/ML ~~LOC~~ SOLN
33.0000 [IU] | Freq: Two times a day (BID) | SUBCUTANEOUS | Status: DC
  Administered 2018-07-16 – 2018-07-17 (×2): 33 [IU] via SUBCUTANEOUS
  Filled 2018-07-16 (×3): qty 0.33

## 2018-07-16 MED ORDER — ONDANSETRON HCL 4 MG/2ML IJ SOLN: 4.0000 mg | Freq: Four times a day (QID) | INTRAMUSCULAR | Status: DC | PRN

## 2018-07-16 MED ORDER — HYDRALAZINE HCL 10 MG PO TABS
10.0000 mg | ORAL_TABLET | Freq: Four times a day (QID) | ORAL | Status: DC
  Administered 2018-07-17 – 2018-07-21 (×18): 10 mg via ORAL
  Filled 2018-07-16 (×18): qty 1

## 2018-07-16 MED ORDER — IPRATROPIUM-ALBUTEROL 0.5-2.5 (3) MG/3ML IN SOLN: 3.0000 mL | Freq: Four times a day (QID) | RESPIRATORY_TRACT | Status: DC

## 2018-07-16 MED ORDER — TIMOLOL MALEATE 0.5 % OP SOLN
1.0000 [drp] | Freq: Every day | OPHTHALMIC | Status: DC
  Administered 2018-07-17 – 2018-07-21 (×5): 1 [drp] via OPHTHALMIC
  Filled 2018-07-16: qty 5

## 2018-07-16 MED ORDER — HYDROCODONE-ACETAMINOPHEN 5-325 MG PO TABS
1.0000 | ORAL_TABLET | Freq: Four times a day (QID) | ORAL | Status: DC | PRN
  Administered 2018-07-20: 1 via ORAL
  Filled 2018-07-16: qty 1

## 2018-07-16 MED ORDER — DOCUSATE SODIUM 100 MG PO CAPS
100.0000 mg | ORAL_CAPSULE | Freq: Two times a day (BID) | ORAL | Status: DC
  Administered 2018-07-17 – 2018-07-21 (×7): 100 mg via ORAL
  Filled 2018-07-16 (×8): qty 1

## 2018-07-16 MED ORDER — VANCOMYCIN HCL 10 G IV SOLR
1250.0000 mg | INTRAVENOUS | Status: DC
  Administered 2018-07-17 – 2018-07-19 (×3): 1250 mg via INTRAVENOUS
  Filled 2018-07-16 (×5): qty 1250

## 2018-07-16 MED ORDER — LACTATED RINGERS IV BOLUS
1000.0000 mL | Freq: Once | INTRAVENOUS | Status: AC
  Administered 2018-07-16: 1000 mL via INTRAVENOUS

## 2018-07-16 NOTE — ED Provider Notes (Signed)
  Physical Exam  BP (!) 136/94   Pulse (!) 123   Temp (!) 100.7 F (38.2 C) (Rectal)   Resp (!) 46   Ht 5\' 7"  (1.702 m)   Wt 76.6 kg   SpO2 100%   BMI 26.45 kg/m   Physical Exam  ED Course/Procedures     Procedures  MDM  Patient is 84yo male on 2L [place] baseline for his COPD. Febrile, tachycardic, tachypneic.  CODE sepsis initiated. Patient currently on 3L [place]. Initially was satting 84% on 2L when EMS arrived. Chronic upper extremities DVT on anticoagulation.   Labs: Lactic normal. WBC 13.1. Hgb baseline.   Pending PE study.   PE study negative. Patient remains tachycardic. Tachypnea has improved since arrival.  Patient admitted to the hospitalist.        Doneta Public, MD 07/16/18 1926    Tegeler, Gwenyth Allegra, MD 07/17/18 [id number]

## 2018-07-16 NOTE — Progress Notes (Signed)
Pharmacy Antibiotic Note  Timothy Garcia is a 84 y.o. male admitted on 07/16/2018 with pneumonia.  Pharmacy has been consulted for vancomycin/Zosyn dosing. Tmax 100.7. WBC 13.1, LA 1.4. Scr 1.12.  Vancomycin 1250 mg IV Q 24 hrs. Goal AUC 400-550. Expected AUC: 509 SCr used: 1.12  Plan: Vancomycin 1500 mg IV x1, then 1250 mg IV q24 hr  Zosyn 3.375g IV q8h (4 hour infusion).  Monitor clinical status, renal function, cultures, and length of therapy Monitor vancomycin levels as needed  Height: 5\' 7"  (170.2 cm) Weight: 168 lb 14 oz (76.6 kg) IBW/kg (Calculated) : 66.1  Temp (24hrs), Avg:100.7 F (38.2 C), Min:100.7 F (38.2 C), Max:100.7 F (38.2 C)  Recent Labs  Lab 07/16/18 1416  WBC 13.1*  CREATININE 1.12  LATICACIDVEN 1.4    Estimated Creatinine Clearance: 48.4 mL/min (by C-G formula based on SCr of 1.12 mg/dL).    Allergies  Allergen Reactions  . Ace Inhibitors Other (See Comments)    Unable to recall Unable to recall   . Iothalamate Other (See Comments)    Patient does not know if allergic to this; able to receive current IV contrast (Omnipaque 350) without adverse effects     Antimicrobials this admission: Vancomycin 5/21 >> Zosyn 5/21 >>  Dose adjustments this admission:   Microbiology results: 5/21 Bcx:  Thank you for allowing pharmacy to be a part of this patient's care.  Claiborne Billings, PharmD PGY2 Cardiology Pharmacy Resident Please check AMION for all Pharmacist numbers by unit 07/16/2018 3:13 PM

## 2018-07-16 NOTE — H&P (Signed)
History and Physical   [name] HGD:[id number] DOB: March 06, 1934 DOA: 07/16/2018  Referring MD/NP/PA: Dr. Sherry Ruffing  PCP: Rogers Blocker, MD   Outpatient Specialists: None  Patient coming from: Home  Chief Complaint: Fever and shortness of breath  HPI: Timothy Garcia is a 84 y.o. male with medical history significant of COPD, chronic DVTs, diabetes, chronic kidney disease, hypertension, history of stomach cancer with distal gastrectomy, chronic kidney disease stage III, who presents to the ER, who is on 2 L at a skilled nursing facility presenting to the ER with shortness of breath and altered mental status.  Patient was found to be hypoxic requiring up to 4 L.  He is currently not communicating adequately.  He is lying down in bed on nonrebreather back.  Patient initially suspected of COVID-19 which is negative.  Further work-up done including chest radiograph showed no obvious PE or pneumonia.  He appears to have exacerbation of his COPD and is being admitted to the hospital for treatment.  Patient has tube feedings from previous surgery..  ED Course: Temperature 100.7, blood pressure 184/111, pulse 127, respiratory 2 1, oxygen sat 86% on 2 L, white count 13.1, hemoglobin 9.9 and platelets 372.  Potassium is 5.6 and glucose 323. PT/INR 14.7 and 1.2.  Chest x-ray showed left retrocardiac infiltrate which appears to have improved.  CT chest showed small bilateral pleural effusions with adjacent atelectasis but no PE and no obvious infiltrates.  Patient initiated on antibiotics steroids and nebulizer and being treated.  Review of Systems: As per HPI otherwise 10 point review of systems negative.    Past Medical History:  Diagnosis Date  . Anemia   . CKD (chronic kidney disease) stage 3, GFR 30-59 ml/min (HCC)   . COPD (chronic obstructive pulmonary disease) ([place])   . Diabetes mellitus without complication ([place])    type 2  . Hypertension   . Hypoxia   . Malignant neoplasm of stomach ([place])   .  Stenosis of carotid artery     History reviewed. No pertinent surgical history.   reports previous alcohol use. He reports that he does not use drugs. No history on file for tobacco.  Allergies  Allergen Reactions  . Ace Inhibitors Other (See Comments)    Unable to recall Unable to recall   . Iothalamate Other (See Comments)    Patient does not know if allergic to this; able to receive current IV contrast (Omnipaque 350) without adverse effects     History reviewed. No pertinent family history.   Prior to Admission medications   Medication Sig Start Date End Date Taking? Authorizing Provider  acetaminophen (TYLENOL) 325 MG tablet Take 650 mg by mouth every 6 (six) hours as needed for fever.   Yes [provider]  albuterol (ACCUNEB) 1.25 MG/3ML nebulizer solution Take 1 ampule by nebulization every 6 (six) hours as needed for wheezing.   Yes [provider]  amoxicillin-clavulanate (AUGMENTIN) 875-125 MG tablet Take 1 tablet by mouth 2 (two) times daily.   Yes [provider]  cefTRIAXone (ROCEPHIN) IVPB Inject 1 g into the vein once.   Yes [provider]  docusate sodium (COLACE) 100 MG capsule Take 100 mg by mouth 2 (two) times daily.   Yes [provider]  enoxaparin (LOVENOX) 80 MG/0.8ML injection Inject 80 mg into the skin 2 (two) times a day.   Yes [provider]  fluconazole (DIFLUCAN) 100 MG tablet Take 100 mg by mouth daily. 04/01/18  Yes [provider]  Glucagon HCl (GLUCAGON EMERGENCY) 1 MG SOLR Inject 1 mg into the muscle every 2 (two) hours as needed (Hyproglycenmia).   Yes [provider]  guaiFENesin (ROBITUSSIN) 100 MG/5ML SOLN Take 5 mLs by mouth every 6 (six) hours.   Yes [provider]  hydrALAZINE (APRESOLINE) 10 MG tablet Take 10 mg by mouth every 6 (six) hours.   Yes [provider]  HYDROcodone-acetaminophen (NORCO/VICODIN) 5-325 MG tablet Take 1 tablet by mouth every 6  (six) hours as needed for moderate pain.   Yes [provider]  Indacaterol-Glycopyrrolate (UTIBRON NEOHALER) 27.5-15.6 MCG CAPS Place 1 puff into inhaler and inhale 2 (two) times a day.   Yes [provider]  insulin aspart (NOVOLOG) 100 UNIT/ML injection Inject 1-9 Units into the skin 3 (three) times daily before meals. 101-150=1U 151-200=2U 201-250=3U 251-300=5U 301-350=7U >350 =9U CALL MD for BS>400 OR<60   Yes [provider]  insulin glargine (LANTUS) 100 UNIT/ML injection Inject 33 Units into the skin 2 (two) times daily.   Yes [provider]  ipratropium-albuterol (DUONEB) 0.5-2.5 (3) MG/3ML SOLN Take 3 mLs by nebulization 3 (three) times daily.   Yes [provider]  latanoprost (XALATAN) 0.005 % ophthalmic solution Place 1 drop into both eyes at bedtime.   Yes [provider]  lidocaine (LIDODERM) 5 % Place 1 patch onto the skin daily. Remove & Discard patch within 12 hours or as directed by MD   Yes [provider]  magic mouthwash SOLN Take 5 mLs by mouth every 6 (six) hours. Switch and Swallow   Yes [provider]  metoCLOPramide (REGLAN) 5 MG tablet Take 5 mg by mouth every 6 (six) hours.   Yes [provider]  metoprolol tartrate (LOPRESSOR) 25 MG tablet Take 12.5 mg by mouth 2 (two) times daily.   Yes [provider]  pantoprazole (PROTONIX) 20 MG tablet Take 20 mg by mouth 2 (two) times daily.   Yes [provider]  timolol (TIMOPTIC) 0.5 % ophthalmic solution Place 1 drop into both eyes daily. 12/12/13  Yes [provider]  TPN ADULT Inject 70.8 mLs into the vein every Monday, Wednesday, and Friday. 3-IN-1 With lipids  Over 24 hours in the evening   Yes [provider]  TPN ADULT Inject 70.8 mLs into the vein See admin instructions. Tue , thurs, sat and Sunday (Without  Lipids) over 24 hours  2-IN-1   Yes [provider]    Physical Exam: Vitals:    07/16/18 1830 07/16/18 1845 07/16/18 1900 07/16/18 1915  BP: (!) 150/71 (!) 184/111 (!) 147/76 (!) 175/78  Pulse: (!) 109 (!) 110 (!) 103 (!) 109  Resp: (!) 34 (!) 41 (!) 31 (!) 25  Temp:      TempSrc:      SpO2: 99% 97% 99% 99%  Weight:      Height:          Constitutional: Chronically ill looking, no acute distress Vitals:   07/16/18 1830 07/16/18 1845 07/16/18 1900 07/16/18 1915  BP: (!) 150/71 (!) 184/111 (!) 147/76 (!) 175/78  Pulse: (!) 109 (!) 110 (!) 103 (!) 109  Resp: (!) 34 (!) 41 (!) 31 (!) 25  Temp:      TempSrc:      SpO2: 99% 97% 99% 99%  Weight:      Height:       Eyes: PERRL, lids and conjunctivae normal ENMT: Mucous membranes are moist. Posterior pharynx clear of  any exudate or lesions.Normal dentition.  Neck: normal, supple, no masses, no thyromegaly Respiratory: Decreased air entry bilaterally with mild expiratory wheezing, no crackles. Normal respiratory effort. No accessory muscle use.  Cardiovascular: Sinus tachycardia, no murmurs / rubs / gallops. No extremity edema. 2+ pedal pulses. No carotid bruits.  Abdomen: no tenderness, no masses palpated. No hepatosplenomegaly. Bowel sounds positive.  G-tube in place Musculoskeletal: no clubbing / cyanosis. No joint deformity upper and lower extremities. Good ROM, no contractures. Normal muscle tone.  Skin: no rashes, lesions, ulcers. No induration Neurologic: CN 2-12 grossly intact. Sensation intact, DTR normal. Strength 5/5 in all 4.  Psychiatric: Confused.  Normal mood.     Labs on Admission: I have personally reviewed following labs and imaging studies  CBC: Recent Labs  Lab 07/16/18 1416 07/16/18 1435  WBC 13.1*  --   NEUTROABS 10.5*  --   HGB 9.5* 9.9*  HCT 32.3* 29.0*  MCV 89.5  --   PLT 372  --    Basic Metabolic Panel: Recent Labs  Lab 07/16/18 1416 07/16/18 1435  NA 138 137  K 5.6* 4.7  CL 103  --   CO2 27  --   GLUCOSE 323*  --   BUN 25*  --   CREATININE 1.12  --   CALCIUM 9.0   --    GFR: Estimated Creatinine Clearance: 48.4 mL/min (by C-G formula based on SCr of 1.12 mg/dL). Liver Function Tests: Recent Labs  Lab 07/16/18 1416  AST 20  ALT 41  ALKPHOS 148*  BILITOT 0.1*  PROT 7.9  ALBUMIN 2.0*   Recent Labs  Lab 07/16/18 1416  LIPASE 51   No results for input(s): AMMONIA in the last 168 hours. Coagulation Profile: Recent Labs  Lab 07/16/18 1416  INR 1.2   Cardiac Enzymes: Recent Labs  Lab 07/16/18 1416  TROPONINI <0.03   BNP (last 3 results) No results for input(s): PROBNP in the last 8760 hours. HbA1C: No results for input(s): HGBA1C in the last 72 hours. CBG: No results for input(s): GLUCAP in the last 168 hours. Lipid Profile: No results for input(s): CHOL, HDL, LDLCALC, TRIG, CHOLHDL, LDLDIRECT in the last 72 hours. Thyroid Function Tests: No results for input(s): TSH, T4TOTAL, FREET4, T3FREE, THYROIDAB in the last 72 hours. Anemia Panel: No results for input(s): VITAMINB12, FOLATE, FERRITIN, TIBC, IRON, RETICCTPCT in the last 72 hours. Urine analysis:    Component Value Date/Time   COLORURINE YELLOW 07/16/2018 1416   APPEARANCEUR HAZY (A) 07/16/2018 1416   LABSPEC 1.018 07/16/2018 1416   PHURINE 5.0 07/16/2018 1416   GLUCOSEU 150 (A) 07/16/2018 1416   HGBUR NEGATIVE 07/16/2018 1416   [place] 07/16/2018 1416   [place] 07/16/2018 1416   PROTEINUR 30 (A) 07/16/2018 1416   NITRITE NEGATIVE 07/16/2018 1416   LEUKOCYTESUR NEGATIVE 07/16/2018 1416   Sepsis Labs: @LABRCNTIP (procalcitonin:4,lacticidven:4) ) Recent Results (from the past 240 hour(s))  SARS Coronavirus 2 (CEPHEID- Performed in [place] hospital lab), Hosp Order     Status: None   Collection Time: 07/16/18  2:16 PM  Result Value Ref Range Status   SARS Coronavirus 2 NEGATIVE NEGATIVE Final    Comment: (NOTE) If result is NEGATIVE SARS-CoV-2 target nucleic acids are NOT DETECTED. The SARS-CoV-2 RNA is generally detectable in upper  and lower  respiratory specimens during the acute phase of infection. The lowest  concentration of SARS-CoV-2 viral copies this assay can detect is 250  copies / mL. A negative result does not preclude  SARS-CoV-2 infection  and should not be used as the sole basis for treatment or other  patient management decisions.  A negative result may occur with  improper specimen collection / handling, submission of specimen other  than nasopharyngeal swab, presence of viral mutation(s) within the  areas targeted by this assay, and inadequate number of viral copies  (<250 copies / mL). A negative result must be combined with clinical  observations, patient history, and epidemiological information. If result is POSITIVE SARS-CoV-2 target nucleic acids are DETECTED. The SARS-CoV-2 RNA is generally detectable in upper and lower  respiratory specimens dur ing the acute phase of infection.  Positive  results are indicative of active infection with SARS-CoV-2.  Clinical  correlation with patient history and other diagnostic information is  necessary to determine patient infection status.  Positive results do  not rule out bacterial infection or co-infection with other viruses. If result is PRESUMPTIVE POSTIVE SARS-CoV-2 nucleic acids MAY BE PRESENT.   A presumptive positive result was obtained on the submitted specimen  and confirmed on repeat testing.  While 2019 novel coronavirus  (SARS-CoV-2) nucleic acids may be present in the submitted sample  additional confirmatory testing may be necessary for epidemiological  and / or clinical management purposes  to differentiate between  SARS-CoV-2 and other Sarbecovirus currently known to infect humans.  If clinically indicated additional testing with an alternate test  methodology [phone]) is advised. The SARS-CoV-2 RNA is generally  detectable in upper and lower respiratory sp ecimens during the acute  phase of infection. The expected result is  Negative. Fact Sheet for Patients:  StrictlyIdeas.no Fact Sheet for Healthcare Providers: BankingDealers.co.za This test is not yet approved or cleared by the [place] FDA and has been authorized for detection and/or diagnosis of SARS-CoV-2 by FDA under an Emergency Use Authorization (EUA).  This EUA will remain in effect (meaning this test can be used) for the duration of the COVID-19 declaration under Section 564(b)(1) of the Act, 21 U.S.C. section 360bbb-3(b)(1), unless the authorization is terminated or revoked sooner. Performed at [facility], [place] [address], [place], [place] [address]      Radiological Exams on Admission: Ct Angio Chest Pe W And/or Wo Contrast  Result Date: 07/16/2018 CLINICAL DATA:  Pneumonia.  Shortness of breath. EXAM: CT ANGIOGRAPHY CHEST WITH CONTRAST TECHNIQUE: Multidetector CT imaging of the chest was performed using the standard protocol during bolus administration of intravenous contrast. Multiplanar CT image reconstructions and MIPs were obtained to evaluate the vascular anatomy. CONTRAST:  160mL OMNIPAQUE IOHEXOL 350 MG/ML SOLN COMPARISON:  CT chest dated 02/09/2015. FINDINGS: Cardiovascular: Evaluation is somewhat limited by motion artifact and streak artifact from the patient's arms. Given this limitation, no definite PE identified on today's exam. Coronary artery calcifications are noted. Atherosclerotic changes are noted of the thoracic aorta. The heart size is not significantly enlarged. Mediastinum/Nodes: No enlarged mediastinal, hilar, or axillary lymph nodes. Thyroid gland, trachea, and esophagus demonstrate no significant findings. Lungs/Pleura: Evaluation is limited by motion artifact. Again identified are extensive emphysematous changes bilaterally. There is chronic scarring at the right lung apex. There is atelectasis involving the bilateral lower lobes with near complete collapse of the left  lower lobe. The trachea is unremarkable. There are small bilateral pleural effusions. Upper Abdomen: The patient appears to be status post prior gastric bypass. The remaining portions of the partially visualized upper abdomen are grossly unremarkable. Musculoskeletal: No chest wall abnormality. No acute or significant osseous findings. Bilateral gynecomastia is noted.  Review of the MIP images confirms the above findings. IMPRESSION: 1. Examination is limited by motion artifact and streak artifact from the patient's arms. 2. Given the limitations described above, no PE identified. Detection of pulmonary emboli at the segmental and subsegmental levels is severely limited. 3. Severe emphysematous changes. Stable scarring in the right upper lobe. 4. Small bilateral pleural effusions with adjacent atelectasis. Aortic Atherosclerosis (ICD10-I70.0) and Emphysema (ICD10-J43.9). Electronically Signed   By: Constance Holster M.D.   On: 07/16/2018 17:15   Dg Chest Port 1 View  Result Date: 07/16/2018 CLINICAL DATA:  Pt here from [facility], facility reports pneumonia x 1 week, having SOB today EXAM: PORTABLE CHEST - 1 VIEW COMPARISON:  06/26/2018 FINDINGS: Right arm PICC line to the SVC. Left retrocardiac consolidation/atelectasis perhaps slightly improved. Probable small left pleural effusion as before. Central pulmonary vascular congestion. Heart size upper limits normal for technique. Aortic Atherosclerosis (ICD10-170.0). No pneumothorax. Visualized bones unremarkable. IMPRESSION: 1. Left retrocardiac consolidation/atelectasis and small effusion, slightly improved. 2. Pulmonary vascular congestion Electronically Signed   By: Lucrezia Europe M.D.   On: 07/16/2018 15:20    EKG: Independently reviewed.  It shows sinus tachycardia rate is 131, diffuse ST elevation in the lateral leads but no old EKG to compare  Assessment/Plan Principal Problem:   Sepsis ([place]) Active Problems:   Hyperglycemia   Benign essential HTN    AMS (altered mental status)   COPD with acute exacerbation (HCC)   CKD (chronic kidney disease) stage 3, GFR 30-59 ml/min (HCC)     #1 sepsis: Suspected pneumonia although CT did not show any.  Patient is going to be admitted and treated as such.  He does have elevated lactic acid with SIRS.  Follow culture results and treat underlying suspected infections.  #2 COPD with acute exacerbation: Most likely cause of his hypoxemia.  Patient will be admitted and treated with COPD gold protocol including antibiotics, Solu-Medrol and nebulizer.  #3 diabetes: Sliding scale insulin with home regimen.  #4 hypertension: Blood pressure control.  Continue home regimen.  #5 chronic kidney disease stage III: Monitor renal function closely.  #6 generalized debility: PT and OT prior to discharge.  #7 history of gastric cancer with G-tube: Tube feedings to resume.   DVT prophylaxis: Lovenox  Code Status: Full code Family Communication: No family at bedside Disposition Plan: Back to skilled facility Consults called: None Admission status: Inpatient  Severity of Illness: The appropriate patient status for this patient is INPATIENT. Inpatient status is judged to be reasonable and necessary in order to provide the required intensity of service to ensure the patient's safety. The patient's presenting symptoms, physical exam findings, and initial radiographic and laboratory data in the context of their chronic comorbidities is felt to place them at high risk for further clinical deterioration. Furthermore, it is not anticipated that the patient will be medically stable for discharge from the hospital within 2 midnights of admission. The following factors support the patient status of inpatient.   " The patient's presenting symptoms include altered mental status and shortness of breath. " The worrisome physical exam findings include confusion and mild expiratory wheezing. " The initial radiographic and  laboratory data are worrisome because of x-ray showing infiltrates. " The chronic co-morbidities include COPD.   * I certify that at the point of admission it is my clinical judgment that the patient will require inpatient hospital care spanning beyond 2 midnights from the point of admission due to high intensity of service, high risk for  further deterioration and high frequency of surveillance required.Barbette Merino MD [facility] Pager [phone]  If 7PM-7AM, please contact night-coverage [email or web address] Password [facility]  07/16/2018, 7:24 PM

## 2018-07-16 NOTE — ED Triage Notes (Signed)
Pt here from [facility], facility reports pneumonia x 1 week and has been getting abx for it, today sats were 84% on 2L (baseline o2 requirement) with wheezing. EMS placed on non-rebreather 15 L for sats of  88%. Has a PICC.

## 2018-07-16 NOTE — ED Notes (Signed)
ED TO INPATIENT HANDOFF REPORT  ED Nurse Name and Phone #: Eugene Garnet [phone]  S Name/Age/Gender Timothy Garcia 84 y.o. male Room/Bed: 023C/023C  Code Status   Code Status: Prior  Home/SNF/Other Skilled nursing facility Patient oriented to: self and situation Is this baseline? Yes   Triage Complete: Triage complete  Chief Complaint ro Covid-19  Triage Note Pt here from [facility], facility reports pneumonia x 1 week and has been getting abx for it, today sats were 84% on 2L (baseline o2 requirement) with wheezing. EMS placed on non-rebreather 15 L for sats of  88%. Has a PICC.   Allergies Allergies  Allergen Reactions  . Ace Inhibitors Other (See Comments)    Unable to recall Unable to recall   . Iothalamate Other (See Comments)    Patient does not know if allergic to this; able to receive current IV contrast (Omnipaque 350) without adverse effects     Level of Care/Admitting Diagnosis ED Disposition    ED Disposition Condition [place]: [facility] [[id number]]  Level of Care: Progressive [102]  Covid Evaluation: N/A  Diagnosis: Sepsis [facility]) [[id number]]  Admitting Physician: Elwyn Reach [[id number]]  Attending Physician: Elwyn Reach [[id number]]  Estimated length of stay: past midnight tomorrow  Certification:: I certify this patient will need inpatient services for at least 2 midnights  PT Class (Do Not Modify): Inpatient [101]  PT Acc Code (Do Not Modify): Private [1]       B Medical/Surgery History Past Medical History:  Diagnosis Date  . Anemia   . CKD (chronic kidney disease) stage 3, GFR 30-59 ml/min (HCC)   . COPD (chronic obstructive pulmonary disease) ([place])   . Diabetes mellitus without complication ([place])    type 2  . Hypertension   . Hypoxia   . Malignant neoplasm of stomach ([place])   . Stenosis of carotid artery    History reviewed. No pertinent surgical history.   A IV Location/Drains/Wounds Patient  Lines/Drains/Airways Status   Active Line/Drains/Airways    Name:   Placement date:   Placement time:   Site:   Days:   Peripheral IV 07/16/18 Left Antecubital   07/16/18    1415    Antecubital   less than 1   Peripheral IV 07/16/18 Right Antecubital   07/16/18    1436    Antecubital   less than 1          Intake/Output Last 24 hours  Intake/Output Summary (Last 24 hours) at 07/16/2018 2022 Last data filed at 07/16/2018 1813 Gross per 24 hour  Intake 500 ml  Output -  Net 500 ml    Labs/Imaging Results for orders placed or performed during the hospital encounter of 07/16/18 (from the past 48 hour(s))  Comprehensive metabolic panel     Status: Abnormal   Collection Time: 07/16/18  2:16 PM  Result Value Ref Range   Sodium 138 135 - 145 mmol/L   Potassium 5.6 (H) 3.5 - 5.1 mmol/L   Chloride 103 98 - 111 mmol/L   CO2 27 22 - 32 mmol/L   Glucose, Bld 323 (H) 70 - 99 mg/dL   BUN 25 (H) 8 - 23 mg/dL   Creatinine, Ser 1.12 0.61 - 1.24 mg/dL   Calcium 9.0 8.9 - 10.3 mg/dL   Total Protein 7.9 6.5 - 8.1 g/dL   Albumin 2.0 (L) 3.5 - 5.0 g/dL   AST 20 15 - 41 U/L   ALT 41 0 -  44 U/L   Alkaline Phosphatase 148 (H) 38 - 126 U/L   Total Bilirubin 0.1 (L) 0.3 - 1.2 mg/dL   GFR calc non Af Amer >60 >60 mL/min   GFR calc Af Amer >60 >60 mL/min   Anion gap 8 5 - 15    Comment: Performed at [facility] [address], [place], [place] [address]  Lactic acid, plasma     Status: None   Collection Time: 07/16/18  2:16 PM  Result Value Ref Range   Lactic Acid, Venous 1.4 0.5 - 1.9 mmol/L    Comment: Performed at [facility] [address] [place], [place] [address]  CBC with Differential     Status: Abnormal   Collection Time: 07/16/18  2:16 PM  Result Value Ref Range   WBC 13.1 (H) 4.0 - 10.5 K/uL   RBC 3.61 (L) 4.22 - 5.81 MIL/uL   Hemoglobin 9.5 (L) 13.0 - 17.0 g/dL   HCT 32.3 (L) 39.0 - 52.0 %   MCV 89.5 80.0 - 100.0 fL   MCH 26.3 26.0 - 34.0 pg   MCHC 29.4 (L) 30.0  - 36.0 g/dL   RDW 14.6 11.5 - 15.5 %   Platelets 372 150 - 400 K/uL   nRBC 0.0 0.0 - 0.2 %   Neutrophils Relative % 80 %   Neutro Abs 10.5 (H) 1.7 - 7.7 K/uL   Lymphocytes Relative 8 %   Lymphs Abs 1.0 0.7 - 4.0 K/uL   Monocytes Relative 10 %   Monocytes Absolute 1.3 (H) 0.1 - 1.0 K/uL   Eosinophils Relative 1 %   Eosinophils Absolute 0.1 0.0 - 0.5 K/uL   Basophils Relative 0 %   Basophils Absolute 0.0 0.0 - 0.1 K/uL   Immature Granulocytes 1 %   Abs Immature Granulocytes 0.10 (H) 0.00 - 0.07 K/uL    Comment: Performed at [facility], [address], [place], [place] [address]  Protime-INR     Status: None   Collection Time: 07/16/18  2:16 PM  Result Value Ref Range   Prothrombin Time 14.7 11.4 - 15.2 seconds   INR 1.2 0.8 - 1.2    Comment: (NOTE) INR goal varies based on device and disease states. Performed at [facility], [place] [address] [place], [place] [address]   Urinalysis, Routine w reflex microscopic     Status: Abnormal   Collection Time: 07/16/18  2:16 PM  Result Value Ref Range   Color, Urine YELLOW YELLOW   APPearance HAZY (A) CLEAR   Specific Gravity, Urine 1.018 1.005 - 1.030   pH 5.0 5.0 - 8.0   Glucose, UA 150 (A) NEGATIVE mg/dL   Hgb urine dipstick NEGATIVE NEGATIVE   Bilirubin Urine NEGATIVE NEGATIVE   Ketones, ur NEGATIVE NEGATIVE mg/dL   Protein, ur 30 (A) NEGATIVE mg/dL   Nitrite NEGATIVE NEGATIVE   Leukocytes,Ua NEGATIVE NEGATIVE   RBC / HPF 0-5 0 - 5 RBC/hpf   WBC, UA 0-5 0 - 5 WBC/hpf   Bacteria, UA NONE SEEN NONE SEEN   Squamous Epithelial / LPF 0-5 0 - 5   Mucus PRESENT     Comment: Performed at [facility], [place] [address] [place],  [address]  SARS Coronavirus 2 (CEPHEID- Performed in [facility] hospital lab), Hosp Order     Status: None   Collection Time: 07/16/18  2:16 PM  Result Value Ref Range   SARS Coronavirus 2 NEGATIVE NEGATIVE  Comment: (NOTE) If result is NEGATIVE SARS-CoV-2 target nucleic acids  are NOT DETECTED. The SARS-CoV-2 RNA is generally detectable in upper and lower  respiratory specimens during the acute phase of infection. The lowest  concentration of SARS-CoV-2 viral copies this assay can detect is 250  copies / mL. A negative result does not preclude SARS-CoV-2 infection  and should not be used as the sole basis for treatment or other  patient management decisions.  A negative result may occur with  improper specimen collection / handling, submission of specimen other  than nasopharyngeal swab, presence of viral mutation(s) within the  areas targeted by this assay, and inadequate number of viral copies  (<250 copies / mL). A negative result must be combined with clinical  observations, patient history, and epidemiological information. If result is POSITIVE SARS-CoV-2 target nucleic acids are DETECTED. The SARS-CoV-2 RNA is generally detectable in upper and lower  respiratory specimens dur ing the acute phase of infection.  Positive  results are indicative of active infection with SARS-CoV-2.  Clinical  correlation with patient history and other diagnostic information is  necessary to determine patient infection status.  Positive results do  not rule out bacterial infection or co-infection with other viruses. If result is PRESUMPTIVE POSTIVE SARS-CoV-2 nucleic acids MAY BE PRESENT.   A presumptive positive result was obtained on the submitted specimen  and confirmed on repeat testing.  While 2019 novel coronavirus  (SARS-CoV-2) nucleic acids may be present in the submitted sample  additional confirmatory testing may be necessary for epidemiological  and / or clinical management purposes  to differentiate between  SARS-CoV-2 and other Sarbecovirus currently known to infect humans.  If clinically indicated additional testing with an alternate test  methodology [phone]) is advised. The SARS-CoV-2 RNA is generally  detectable in upper and lower respiratory sp ecimens  during the acute  phase of infection. The expected result is Negative. Fact Sheet for Patients:  StrictlyIdeas.no Fact Sheet for Healthcare Providers: BankingDealers.co.za This test is not yet approved or cleared by the [place] FDA and has been authorized for detection and/or diagnosis of SARS-CoV-2 by FDA under an Emergency Use Authorization (EUA).  This EUA will remain in effect (meaning this test can be used) for the duration of the COVID-19 declaration under Section 564(b)(1) of the Act, 21 U.S.C. section 360bbb-3(b)(1), unless the authorization is terminated or revoked sooner. Performed at [facility], [place] [address] [place], [place] [address]   Lipase, blood     Status: None   Collection Time: 07/16/18  2:16 PM  Result Value Ref Range   Lipase 51 11 - 51 U/L    Comment: Performed at [facility] [address] [place], [place] [address]  Brain natriuretic peptide     Status: Abnormal   Collection Time: 07/16/18  2:16 PM  Result Value Ref Range   B Natriuretic Peptide 142.9 (H) 0.0 - 100.0 pg/mL    Comment: Performed at [facility] [address], [place], [place] [address]  Troponin I - ONCE - STAT     Status: None   Collection Time: 07/16/18  2:16 PM  Result Value Ref Range   Troponin I <0.03 <0.03 ng/mL    Comment: Performed at [facility], [place] [address] [place], [place] [address]  I-STAT 7, (LYTES, BLD GAS, ICA, H+H)     Status: Abnormal   Collection Time: 07/16/18  2:35 PM  Result Value Ref Range  pH, Arterial 7.301 (L) 7.350 - 7.450   pCO2 arterial 56.3 (H) 32.0 - 48.0 mmHg   pO2, Arterial 99.0 83.0 - 108.0 mmHg   Bicarbonate 27.5 20.0 - 28.0 mmol/L   TCO2 29 22 - 32 mmol/L   O2 Saturation 96.0 %   Acid-Base Excess 1.0 0.0 - 2.0 mmol/L   Sodium 137 135 - 145 mmol/L   Potassium 4.7 3.5 - 5.1 mmol/L   Calcium, Ion 1.29 1.15 - 1.40 mmol/L   HCT 29.0 (L) 39.0 - 52.0 %   Hemoglobin  9.9 (L) 13.0 - 17.0 g/dL   Patient temperature 100.1 F    Collection site RADIAL, ALLEN'S TEST ACCEPTABLE    Sample type ARTERIAL    Ct Angio Chest Pe W And/or Wo Contrast  Result Date: 07/16/2018 CLINICAL DATA:  Pneumonia.  Shortness of breath. EXAM: CT ANGIOGRAPHY CHEST WITH CONTRAST TECHNIQUE: Multidetector CT imaging of the chest was performed using the standard protocol during bolus administration of intravenous contrast. Multiplanar CT image reconstructions and MIPs were obtained to evaluate the vascular anatomy. CONTRAST:  117mL OMNIPAQUE IOHEXOL 350 MG/ML SOLN COMPARISON:  CT chest dated 02/09/2015. FINDINGS: Cardiovascular: Evaluation is somewhat limited by motion artifact and streak artifact from the patient's arms. Given this limitation, no definite PE identified on today's exam. Coronary artery calcifications are noted. Atherosclerotic changes are noted of the thoracic aorta. The heart size is not significantly enlarged. Mediastinum/Nodes: No enlarged mediastinal, hilar, or axillary lymph nodes. Thyroid gland, trachea, and esophagus demonstrate no significant findings. Lungs/Pleura: Evaluation is limited by motion artifact. Again identified are extensive emphysematous changes bilaterally. There is chronic scarring at the right lung apex. There is atelectasis involving the bilateral lower lobes with near complete collapse of the left lower lobe. The trachea is unremarkable. There are small bilateral pleural effusions. Upper Abdomen: The patient appears to be status post prior gastric bypass. The remaining portions of the partially visualized upper abdomen are grossly unremarkable. Musculoskeletal: No chest wall abnormality. No acute or significant osseous findings. Bilateral gynecomastia is noted. Review of the MIP images confirms the above findings. IMPRESSION: 1. Examination is limited by motion artifact and streak artifact from the patient's arms. 2. Given the limitations described above, no PE  identified. Detection of pulmonary emboli at the segmental and subsegmental levels is severely limited. 3. Severe emphysematous changes. Stable scarring in the right upper lobe. 4. Small bilateral pleural effusions with adjacent atelectasis. Aortic Atherosclerosis (ICD10-I70.0) and Emphysema (ICD10-J43.9). Electronically Signed   By: Constance Holster M.D.   On: 07/16/2018 17:15   Dg Chest Port 1 View  Result Date: 07/16/2018 CLINICAL DATA:  Pt here from [facility], facility reports pneumonia x 1 week, having SOB today EXAM: PORTABLE CHEST - 1 VIEW COMPARISON:  06/26/2018 FINDINGS: Right arm PICC line to the SVC. Left retrocardiac consolidation/atelectasis perhaps slightly improved. Probable small left pleural effusion as before. Central pulmonary vascular congestion. Heart size upper limits normal for technique. Aortic Atherosclerosis (ICD10-170.0). No pneumothorax. Visualized bones unremarkable. IMPRESSION: 1. Left retrocardiac consolidation/atelectasis and small effusion, slightly improved. 2. Pulmonary vascular congestion Electronically Signed   By: Lucrezia Europe M.D.   On: 07/16/2018 15:20    Pending Labs Unresulted Labs (From admission, onward)    Start     Ordered   07/16/18 1502  Urine culture  ONCE - STAT,   STAT     07/16/18 1501   38/25/05 3976  Ehrlichia antibody panel  Once,   R     07/16/18 1449  07/16/18 1411  Blood gas, arterial ([facility], [facility], [facility])  ONCE - STAT,   STAT     07/16/18 1411   07/16/18 1407  Lactic acid, plasma  Now then every 2 hours,   STAT     07/16/18 1406   07/16/18 1407  Culture, blood (Routine x 2)  BLOOD CULTURE X 2,   STAT     07/16/18 1406   Signed and Held  Comprehensive metabolic panel  Tomorrow morning,   R     Signed and Held   Signed and Held  CBC  Tomorrow morning,   R     Signed and Held          Vitals/Pain Today's Vitals   07/16/18 1900 07/16/18 1915 07/16/18 2000 07/16/18 2015  BP: (!) 147/76 (!) 175/78 (!) 124/102 (!) 141/72  Pulse: (!) 103  (!) 109 (!) 106 (!) 101  Resp: (!) 31 (!) 25  (!) 37  Temp:      TempSrc:      SpO2: 99% 99% 100% 99%  Weight:      Height:      PainSc:        Isolation Precautions Droplet and Contact precautions  Medications Medications  albuterol (VENTOLIN HFA) 108 (90 Base) MCG/ACT inhaler 4 puff (4 puffs Inhalation Given 07/16/18 1439)  vancomycin (VANCOCIN) 1,250 mg in sodium chloride 0.9 % 250 mL IVPB (has no administration in time range)  piperacillin-tazobactam (ZOSYN) IVPB 3.375 g (has no administration in time range)  sodium chloride flush (NS) 0.9 % injection 3 mL (3 mLs Intravenous Given 07/16/18 1440)  lactated ringers bolus 1,000 mL (1,000 mLs Intravenous New Bag/Given 07/16/18 1429)  methylPREDNISolone sodium succinate (SOLU-MEDROL) 125 mg/2 mL injection 125 mg (125 mg Intravenous Given 07/16/18 1429)  piperacillin-tazobactam (ZOSYN) IVPB 3.375 g (0 g Intravenous Stopped 07/16/18 1535)  vancomycin (VANCOCIN) 1,500 mg in sodium chloride 0.9 % 500 mL IVPB (0 mg Intravenous Stopped 07/16/18 1813)  iohexol (OMNIPAQUE) 350 MG/ML injection 100 mL (100 mLs Intravenous Contrast Given 07/16/18 1645)    Mobility non-ambulatory High fall risk   Focused Assessments Pulmonary Assessment Handoff:  Lung sounds: L Breath Sounds: Inspiratory wheezes, Expiratory wheezes R Breath Sounds: Inspiratory wheezes, Expiratory wheezes O2 Device: NRB O2 Flow Rate (L/min): 5 L/min      R Recommendations: See Admitting Provider Note  Report given to: [place], RN  Additional Notes:

## 2018-07-16 NOTE — ED Provider Notes (Signed)
[facility] EMERGENCY DEPARTMENT Provider Note   CSN: [id number] Arrival date & time:       History   Chief Complaint Chief Complaint  Patient presents with  . Altered Mental Status  . Shortness of Breath    HPI Timothy Garcia is a 84 y.o. male.   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  History is limited from patient.  EMS reports that he is recently been on antibiotics for pneumonia.  Patient arrives tachycardic and tachypneic.  He SPO2 was 84% on EMS arrival to the nursing facility on 2 L.  That was increased to 4 L and then he was placed on a nonrebreather in route.  Patient endorses cough, shortness of breath.  Denies chest pain.  No past medical history on file.  There are no active problems to display for this patient.   Home Medications    Prior to Admission medications   Not on File    Family History No family history on file.  Social History Social History   Tobacco Use  . Smoking status: Not on file  Substance Use Topics  . Alcohol use: Not on file  . Drug use: Not on file     Allergies   Patient has no allergy information on record.   Review of Systems Review of Systems  Constitutional: Negative for chills and fever.  HENT: Negative for ear pain and sore throat.   Eyes: Negative for pain and visual disturbance.  Respiratory: Positive for cough and shortness of breath.   Cardiovascular: Negative for chest pain and palpitations.  Gastrointestinal: Negative for abdominal pain and vomiting.  Genitourinary: Negative for dysuria and hematuria.  Musculoskeletal: Negative for arthralgias and back pain.  Skin: Negative for color change and rash.  Neurological: Negative for seizures and syncope.  All other  systems reviewed and are negative.    Physical Exam Updated Vital Signs BP 140/80 (BP Location: Left Arm)   Pulse (!) 127   Temp (!) 100.7 F (38.2 C) (Rectal)   Resp (!) 50   SpO2 100%   Physical Exam Vitals signs and nursing note reviewed.  Constitutional:      Appearance: He is ill-appearing and toxic-appearing.  HENT:     Head: Normocephalic and atraumatic.  Eyes:     Conjunctiva/sclera: Conjunctivae normal.  Neck:     Musculoskeletal: Neck supple.  Cardiovascular:     Rate and Rhythm: Regular rhythm. Tachycardia present.     Heart sounds: No murmur.  Pulmonary:     Effort: Tachypnea present. No respiratory distress.     Breath sounds: Wheezing present.  Abdominal:     Palpations: Abdomen is soft.     Tenderness: There is no abdominal tenderness.  Skin:    General: Skin is warm and dry.  Neurological:     General: No focal deficit present.     Mental Status: He is alert.      ED Treatments / Results  Labs (all labs ordered are listed, but only abnormal results are displayed) Labs Reviewed - No data to display  EKG None  Radiology No results found.  Procedures Procedures (including critical care time)  Medications Ordered in ED Medications - No data to display   Initial Impression / Assessment and Plan / ED  Course  I have reviewed the triage vital signs and the nursing notes.  Pertinent labs & imaging results that were available during my care of the patient were reviewed by me and considered in my medical decision making (see chart for details).  84 year old male with a history of COPD on 2 L nasal cannula at baseline, T2 DM, stage III CKD, gastric cancer status post subtotal distal gastrectomy with Roux-en-Y reconstruction presents from rehab facility with altered mental status and shortness of breath.  On arrival, patient is febrile, tachycardic, and tachypneic.  Patient is able to tell me that he would want to be intubated if needed.    Code  sepsis initiated.  Blood cultures obtained.  Vancomycin and Zosyn given.  I called the nursing facility he reports that he has been on Augmentin for pneumonia.  This morning he had increased work of breathing, retractions, and required increase in his nasal cannula oxygen.  They confirmed that the patient is full code.  ABG: pH 7.3/PCO2 56.3/PO2 99/bicarb 27.5.  Leukocytosis 13.1, hemoglobin 9.5.   Patient signed out at 3 PM.  Plan is to follow-up labs and chest x-ray.  Plan on admission.    Final Clinical Impressions(s) / ED Diagnoses   Final diagnoses:  None    ED Discharge Orders    None       Trinidad Curet, MD 07/16/18 1502    Elnora Morrison, MD 07/16/18 [phone]

## 2018-07-17 ENCOUNTER — Inpatient Hospital Stay (HOSPITAL_COMMUNITY): Payer: Medicare Other

## 2018-07-17 DIAGNOSIS — Z515 Encounter for palliative care: Secondary | ICD-10-CM

## 2018-07-17 DIAGNOSIS — G9341 Metabolic encephalopathy: Secondary | ICD-10-CM

## 2018-07-17 DIAGNOSIS — I472 Ventricular tachycardia: Secondary | ICD-10-CM

## 2018-07-17 DIAGNOSIS — Z7189 Other specified counseling: Secondary | ICD-10-CM

## 2018-07-17 LAB — URINE CULTURE: Culture: NO GROWTH

## 2018-07-17 LAB — GLUCOSE, CAPILLARY
Glucose-Capillary: 184 mg/dL — ABNORMAL HIGH (ref 70–99)
Glucose-Capillary: 183 mg/dL — ABNORMAL HIGH (ref 70–99)
Glucose-Capillary: 193 mg/dL — ABNORMAL HIGH (ref 70–99)
Glucose-Capillary: 142 mg/dL — ABNORMAL HIGH (ref 70–99)
Glucose-Capillary: 126 mg/dL — ABNORMAL HIGH (ref 70–99)

## 2018-07-17 LAB — BLOOD CULTURE ID PANEL (REFLEXED)

## 2018-07-17 LAB — COMPREHENSIVE METABOLIC PANEL
ALT: 34 U/L (ref 0–44)
AST: 19 U/L (ref 15–41)
Albumin: 1.6 g/dL — ABNORMAL LOW (ref 3.5–5.0)
Alkaline Phosphatase: 129 U/L — ABNORMAL HIGH (ref 38–126)
Anion gap: 8 (ref 5–15)
BUN: 21 mg/dL (ref 8–23)
CO2: 25 mmol/L (ref 22–32)
Calcium: 8.2 mg/dL — ABNORMAL LOW (ref 8.9–10.3)
Chloride: 105 mmol/L (ref 98–111)
Creatinine, Ser: 1.03 mg/dL (ref 0.61–1.24)
GFR calc Af Amer: >60 mL/min (ref >60)
GFR calc non Af Amer: >60 mL/min (ref >60)
Glucose, Bld: 199 mg/dL — ABNORMAL HIGH (ref 70–99)
Potassium: 4.6 mmol/L (ref 3.5–5.1)
Sodium: 138 mmol/L (ref 135–145)
Total Bilirubin: 0.3 mg/dL (ref 0.3–1.2)
Total Protein: 6.6 g/dL (ref 6.5–8.1)

## 2018-07-17 LAB — BLOOD GAS, ARTERIAL
Acid-Base Excess: 0.2 mmol/L (ref 0.0–2.0)
Bicarbonate: 24.9 mmol/L (ref 20.0–28.0)
Drawn by: 330991
FIO2: 100
O2 Saturation: 99.3 %
Patient temperature: 98.6
pCO2 arterial: 45.3 mmHg (ref 32.0–48.0)
pH, Arterial: 7.36 (ref 7.350–7.450)
pO2, Arterial: 212 mmHg — ABNORMAL HIGH (ref 83.0–108.0)

## 2018-07-17 LAB — CBC
HCT: 27 % — ABNORMAL LOW (ref 39.0–52.0)
Hemoglobin: 8.1 g/dL — ABNORMAL LOW (ref 13.0–17.0)
MCH: 26 pg (ref 26.0–34.0)
MCHC: 30 g/dL (ref 30.0–36.0)
MCV: 86.5 fL (ref 80.0–100.0)
Platelets: 266 10*3/uL (ref 150–400)
RBC: 3.12 MIL/uL — ABNORMAL LOW (ref 4.22–5.81)
RDW: 14.5 % (ref 11.5–15.5)
WBC: 10 10*3/uL (ref 4.0–10.5)
nRBC: 0 % (ref 0.0–0.2)

## 2018-07-17 LAB — EHRLICHIA ANTIBODY PANEL
E chaffeensis (HGE) Ab, IgG: NEGATIVE
E chaffeensis (HGE) Ab, IgM: NEGATIVE
E. Chaffeensis (HME) IgM Titer: NEGATIVE
E.Chaffeensis (HME) IgG: NEGATIVE

## 2018-07-17 LAB — MRSA PCR SCREENING: MRSA by PCR: NEGATIVE

## 2018-07-17 MED ORDER — ADULT MULTIVITAMIN W/MINERALS CH
1.0000 | ORAL_TABLET | Freq: Every day | ORAL | Status: DC
  Administered 2018-07-17 – 2018-07-21 (×5): 1 via ORAL
  Filled 2018-07-17 (×4): qty 1

## 2018-07-17 MED ORDER — METHYLPREDNISOLONE SODIUM SUCC 125 MG IJ SOLR
60.0000 mg | Freq: Three times a day (TID) | INTRAMUSCULAR | Status: DC
  Administered 2018-07-17 – 2018-07-18 (×2): 60 mg via INTRAVENOUS
  Filled 2018-07-17 (×2): qty 2

## 2018-07-17 MED ORDER — ENOXAPARIN SODIUM 80 MG/0.8ML ~~LOC~~ SOLN
75.0000 mg | Freq: Two times a day (BID) | SUBCUTANEOUS | Status: DC
  Administered 2018-07-17: 75 mg via SUBCUTANEOUS
  Filled 2018-07-17: qty 0.8

## 2018-07-17 MED ORDER — INSULIN GLARGINE 100 UNIT/ML ~~LOC~~ SOLN
15.0000 [IU] | Freq: Two times a day (BID) | SUBCUTANEOUS | Status: DC
  Administered 2018-07-17: 15 [IU] via SUBCUTANEOUS
  Filled 2018-07-17 (×3): qty 0.15

## 2018-07-17 MED ORDER — IPRATROPIUM-ALBUTEROL 0.5-2.5 (3) MG/3ML IN SOLN
3.0000 mL | Freq: Three times a day (TID) | RESPIRATORY_TRACT | Status: DC
  Administered 2018-07-17 – 2018-07-20 (×8): 3 mL via RESPIRATORY_TRACT
  Filled 2018-07-17 (×8): qty 3

## 2018-07-17 MED ORDER — SODIUM CHLORIDE 0.9% FLUSH: 10.0000 mL | INTRAVENOUS | Status: DC | PRN

## 2018-07-17 MED ORDER — ENSURE ENLIVE PO LIQD
237.0000 mL | Freq: Three times a day (TID) | ORAL | Status: DC
  Administered 2018-07-17 – 2018-07-20 (×5): 237 mL via ORAL

## 2018-07-17 NOTE — Progress Notes (Signed)
Patient titrated to a [place] 3 L, sat 96%.  Patient tolerating well at this time.

## 2018-07-17 NOTE — Progress Notes (Signed)
Patient currently on [facility] with sats of 96%. Patient tolerating well. BIPAP is not needed at this time. Will continue to monitor.

## 2018-07-17 NOTE — Progress Notes (Signed)
Patient ID: Timothy Garcia, male   DOB: [birth year], 84 y.o.   MRN: [id number]  PROGRESS NOTE    Timothy Garcia  KCM:[id number] DOB: 05-13-1934 DOA: 07/16/2018 PCP: Rogers Blocker, MD   Brief Narrative:  84 year old male with history of COPD, chronic DVTs, diabetes, chronic renal disease, hypertension, stomach cancer with distal gastrectomy, chronic kidney disease stage III presented on 07/16/2018 from skilled nursing facility with shortness of breath and altered mental status.  He was initially hypoxic and needed nonrebreather.  COVID-19 testing was negative.  CT chest showed small bilateral pleural effusions with advanced left lysis but no PE and no obvious infiltrates.  Patient was started on broad-spectrum antibiotics.  Assessment & Plan:   Principal Problem:   Sepsis ([place]) Active Problems:   Hyperglycemia   Benign essential HTN   AMS (altered mental status)   COPD with acute exacerbation (HCC)   CKD (chronic kidney disease) stage 3, GFR 30-59 ml/min (HCC)   NSVT (nonsustained ventricular tachycardia) (HCC)  Sepsis: Present on admission -Questionable cause.  CT of the chest did not show obvious infiltrate. -Presented with fever and leukocytosis with altered mental status. -Follow cultures.  Continue broad-spectrum antibiotics for another 24 hours. -Decrease IV fluids to 50 cc an hour  Acute hypoxic respiratory failure -May be secondary to COPD exacerbation -Patient was on nonrebreather on presentation.  Respiratory status improving. -Wean off as able  COPD with exacerbation -Pressor status improving.  Decrease Solu-Medrol to 60 mg IV every 8 hours.  Continue nebs and Anoro Ellipta -Outpatient follow-up with pulmonary  Acute metabolic encephalopathy -Probably from above. -Patient was extremely confused on presentation.  Mental status slightly improving.  Monitor mental status. -Fall precautions. -PT/OT/SLP evaluation -Vitamin B12, folate, TSH levels in a.m. -If mental status does not  improve, will consider MRI of the brain and EEG  Diabetes mellitus type 2 -Monitor CBGs with SSI.  Chronic kidney disease stage III -Creatinine stable.  Outpatient follow-up  History of gastric cancer with G-tube -Resume tube feedings.  Outpatient follow-up  Generalized debility -PT/OT. -Palliative care consult for goals of care.   DVT prophylaxis: Lovenox, will switch to prophylactic dose Code Status: Full Family Communication: Spoke to Sister [name] on phone on 07/17/2018 Disposition Plan: Back to SNF once clinically improved in the next 2 to 3 days  Consultants: Palliative care  Procedures: None  Antimicrobials: Vancomycin and Zosyn from 07/16/2018 onwards   Subjective: Patient seen and examined at bedside.  He is sleepy, wakes up slightly, answers only very minimal questions.  Poor historian.  Slightly confused.  Objective: Vitals:   07/17/18 0801 07/17/18 0822 07/17/18 0839 07/17/18 0905  BP: 100/81 100/81 (!) 127/59 121/65  Pulse: 92 99 99 98  Resp: (!) 27 (!) 32 (!) 27   Temp:   97.9 F (36.6 C)   TempSrc:   Oral   SpO2: 100% 96% 97%   Weight:      Height:        Intake/Output Summary (Last 24 hours) at 07/17/2018 1028 Last data filed at 07/17/2018 0955 Gross per 24 hour  Intake 1455.54 ml  Output 750 ml  Net 705.54 ml   Filed Weights   07/16/18 1400 07/16/18 2115 07/17/18 0503  Weight: 76.6 kg 73.3 kg 73.5 kg    Examination:  General exam: Elderly male.  Lying in bed.  Slightly confused. Respiratory system: Bilateral decreased breath sounds at bases with scattered crackles.  Tachypneic Cardiovascular system: S1 & S2 heard, Rate controlled Gastrointestinal system: Abdomen  is nondistended, soft and nontender. Normal bowel sounds heard. Extremities: No cyanosis, clubbing, edema  Central nervous system: Slightly confused. No focal neurological deficits. Moving extremities Skin: No rashes, lesions or ulcers Psychiatry: Could not be evaluated  because of mental status.    Data Reviewed: I have personally reviewed following labs and imaging studies  CBC: Recent Labs  Lab 07/16/18 1416 07/16/18 1435 07/17/18 0408  WBC 13.1*  --  10.0  NEUTROABS 10.5*  --   --   HGB 9.5* 9.9* 8.1*  HCT 32.3* 29.0* 27.0*  MCV 89.5  --  86.5  PLT 372  --  431   Basic Metabolic Panel: Recent Labs  Lab 07/16/18 1416 07/16/18 1435 07/17/18 0408  NA 138 137 138  K 5.6* 4.7 4.6  CL 103  --  105  CO2 27  --  25  GLUCOSE 323*  --  199*  BUN 25*  --  21  CREATININE 1.12  --  1.03  CALCIUM 9.0  --  8.2*   GFR: Estimated Creatinine Clearance: 52.6 mL/min (by C-G formula based on SCr of 1.03 mg/dL). Liver Function Tests: Recent Labs  Lab 07/16/18 1416 07/17/18 0408  AST 20 19  ALT 41 34  ALKPHOS 148* 129*  BILITOT 0.1* 0.3  PROT 7.9 6.6  ALBUMIN 2.0* 1.6*   Recent Labs  Lab 07/16/18 1416  LIPASE 51   No results for input(s): AMMONIA in the last 168 hours. Coagulation Profile: Recent Labs  Lab 07/16/18 1416  INR 1.2   Cardiac Enzymes: Recent Labs  Lab 07/16/18 1416  TROPONINI <0.03   BNP (last 3 results) No results for input(s): PROBNP in the last 8760 hours. HbA1C: No results for input(s): HGBA1C in the last 72 hours. CBG: Recent Labs  Lab 07/16/18 2123 07/17/18 0236 07/17/18 0825  GLUCAP 201* 184* 183*   Lipid Profile: No results for input(s): CHOL, HDL, LDLCALC, TRIG, CHOLHDL, LDLDIRECT in the last 72 hours. Thyroid Function Tests: No results for input(s): TSH, T4TOTAL, FREET4, T3FREE, THYROIDAB in the last 72 hours. Anemia Panel: No results for input(s): VITAMINB12, FOLATE, FERRITIN, TIBC, IRON, RETICCTPCT in the last 72 hours. Sepsis Labs: Recent Labs  Lab 07/16/18 1416  LATICACIDVEN 1.4    Recent Results (from the past 240 hour(s))  SARS Coronavirus 2 (CEPHEID- Performed in [facility] hospital lab), Hosp Order     Status: None   Collection Time: 07/16/18  2:16 PM  Result Value Ref Range  Status   SARS Coronavirus 2 NEGATIVE NEGATIVE Final    Comment: (NOTE) If result is NEGATIVE SARS-CoV-2 target nucleic acids are NOT DETECTED. The SARS-CoV-2 RNA is generally detectable in upper and lower  respiratory specimens during the acute phase of infection. The lowest  concentration of SARS-CoV-2 viral copies this assay can detect is 250  copies / mL. A negative result does not preclude SARS-CoV-2 infection  and should not be used as the sole basis for treatment or other  patient management decisions.  A negative result may occur with  improper specimen collection / handling, submission of specimen other  than nasopharyngeal swab, presence of viral mutation(s) within the  areas targeted by this assay, and inadequate number of viral copies  (<250 copies / mL). A negative result must be combined with clinical  observations, patient history, and epidemiological information. If result is POSITIVE SARS-CoV-2 target nucleic acids are DETECTED. The SARS-CoV-2 RNA is generally detectable in upper and lower  respiratory specimens dur ing the acute phase of  infection.  Positive  results are indicative of active infection with SARS-CoV-2.  Clinical  correlation with patient history and other diagnostic information is  necessary to determine patient infection status.  Positive results do  not rule out bacterial infection or co-infection with other viruses. If result is PRESUMPTIVE POSTIVE SARS-CoV-2 nucleic acids MAY BE PRESENT.   A presumptive positive result was obtained on the submitted specimen  and confirmed on repeat testing.  While 2019 novel coronavirus  (SARS-CoV-2) nucleic acids may be present in the submitted sample  additional confirmatory testing may be necessary for epidemiological  and / or clinical management purposes  to differentiate between  SARS-CoV-2 and other Sarbecovirus currently known to infect humans.  If clinically indicated additional testing with an alternate  test  methodology [phone]) is advised. The SARS-CoV-2 RNA is generally  detectable in upper and lower respiratory sp ecimens during the acute  phase of infection. The expected result is Negative. Fact Sheet for Patients:  StrictlyIdeas.no Fact Sheet for Healthcare Providers: BankingDealers.co.za This test is not yet approved or cleared by the [place] FDA and has been authorized for detection and/or diagnosis of SARS-CoV-2 by FDA under an Emergency Use Authorization (EUA).  This EUA will remain in effect (meaning this test can be used) for the duration of the COVID-19 declaration under Section 564(b)(1) of the Act, 21 U.S.C. section 360bbb-3(b)(1), unless the authorization is terminated or revoked sooner. Performed at [facility], [place] [address], [place], [place] [address]   MRSA PCR Screening     Status: None   Collection Time: 07/16/18 11:44 PM  Result Value Ref Range Status   MRSA by PCR NEGATIVE NEGATIVE Final    Comment:        The GeneXpert MRSA Assay (FDA approved for NASAL specimens only), is one component of a comprehensive MRSA colonization surveillance program. It is not intended to diagnose MRSA infection nor to guide or monitor treatment for MRSA infections. Performed at [facility], [place] [address] [place], [place] [address]          Radiology Studies: Ct Angio Chest Pe W And/or Wo Contrast  Result Date: 07/16/2018 CLINICAL DATA:  Pneumonia.  Shortness of breath. EXAM: CT ANGIOGRAPHY CHEST WITH CONTRAST TECHNIQUE: Multidetector CT imaging of the chest was performed using the standard protocol during bolus administration of intravenous contrast. Multiplanar CT image reconstructions and MIPs were obtained to evaluate the vascular anatomy. CONTRAST:  185mL OMNIPAQUE IOHEXOL 350 MG/ML SOLN COMPARISON:  CT chest dated 02/09/2015. FINDINGS: Cardiovascular: Evaluation is somewhat limited by motion  artifact and streak artifact from the patient's arms. Given this limitation, no definite PE identified on today's exam. Coronary artery calcifications are noted. Atherosclerotic changes are noted of the thoracic aorta. The heart size is not significantly enlarged. Mediastinum/Nodes: No enlarged mediastinal, hilar, or axillary lymph nodes. Thyroid gland, trachea, and esophagus demonstrate no significant findings. Lungs/Pleura: Evaluation is limited by motion artifact. Again identified are extensive emphysematous changes bilaterally. There is chronic scarring at the right lung apex. There is atelectasis involving the bilateral lower lobes with near complete collapse of the left lower lobe. The trachea is unremarkable. There are small bilateral pleural effusions. Upper Abdomen: The patient appears to be status post prior gastric bypass. The remaining portions of the partially visualized upper abdomen are grossly unremarkable. Musculoskeletal: No chest wall abnormality. No acute or significant osseous findings. Bilateral gynecomastia is noted. Review of the MIP images confirms the above findings. IMPRESSION: 1. Examination is  limited by motion artifact and streak artifact from the patient's arms. 2. Given the limitations described above, no PE identified. Detection of pulmonary emboli at the segmental and subsegmental levels is severely limited. 3. Severe emphysematous changes. Stable scarring in the right upper lobe. 4. Small bilateral pleural effusions with adjacent atelectasis. Aortic Atherosclerosis (ICD10-I70.0) and Emphysema (ICD10-J43.9). Electronically Signed   By: Constance Holster M.D.   On: 07/16/2018 17:15   Dg Chest Port 1 View  Result Date: 07/17/2018 CLINICAL DATA:  84 year old male acute respiratory distress. EXAM: PORTABLE CHEST 1 VIEW COMPARISON:  CTA chest yesterday, and earlier. FINDINGS: Portable AP semi upright view at 0229 hours. Stable right PICC line. Stable cardiac size and mediastinal  contours. Stable lung volumes. Bilateral layering pleural effusions were better demonstrated by CTA. Evidence of upper lobe predominant emphysema again noted. Confluent retrocardiac opacity persists, but most resembles atelectasis on CTA. No areas of worsening ventilation. Visualized tracheal air column is within normal limits. Negative visible bowel gas pattern. No acute osseous abnormality identified. IMPRESSION: Stable ventilation since yesterday. Pleural effusions and lung base atelectasis superimposed on emphysema as seen by CTA. Electronically Signed   By: Genevie Ann M.D.   On: 07/17/2018 03:11   Dg Chest Port 1 View  Result Date: 07/16/2018 CLINICAL DATA:  Pt here from [facility], facility reports pneumonia x 1 week, having SOB today EXAM: PORTABLE CHEST - 1 VIEW COMPARISON:  06/26/2018 FINDINGS: Right arm PICC line to the SVC. Left retrocardiac consolidation/atelectasis perhaps slightly improved. Probable small left pleural effusion as before. Central pulmonary vascular congestion. Heart size upper limits normal for technique. Aortic Atherosclerosis (ICD10-170.0). No pneumothorax. Visualized bones unremarkable. IMPRESSION: 1. Left retrocardiac consolidation/atelectasis and small effusion, slightly improved. 2. Pulmonary vascular congestion Electronically Signed   By: Lucrezia Europe M.D.   On: 07/16/2018 15:20        Scheduled Meds: . docusate sodium  100 mg Oral BID  . enoxaparin  80 mg Subcutaneous BID  . guaiFENesin  5 mL Oral Q6H  . hydrALAZINE  10 mg Oral Q6H  . insulin aspart  0-15 Units Subcutaneous TID WC  . insulin aspart  0-5 Units Subcutaneous QHS  . insulin glargine  33 Units Subcutaneous BID  . ipratropium-albuterol  3 mL Nebulization TID  . latanoprost  1 drop Both Eyes QHS  . lidocaine  1 patch Transdermal Daily  . magic mouthwash  5 mL Oral Q6H  . methylPREDNISolone (SOLU-MEDROL) injection  125 mg Intravenous Q6H  . metoCLOPramide  5 mg Oral Q6H  . metoprolol tartrate  12.5  mg Oral BID  . pantoprazole  20 mg Oral BID  . timolol  1 drop Both Eyes Daily  . umeclidinium-vilanterol  1 puff Inhalation Daily   Continuous Infusions: . sodium chloride 75 mL/hr at 07/16/18 2258  . azithromycin 500 mg (07/16/18 2321)  . piperacillin-tazobactam (ZOSYN)  IV 3.375 g (07/17/18 0521)  . vancomycin       LOS: 1 day        Aline August, MD [facility] 07/17/2018, 10:28 AM

## 2018-07-17 NOTE — Progress Notes (Addendum)
Initial Nutrition Assessment  RD working remotely.  DOCUMENTATION CODES:   Not applicable  INTERVENTION:   -Follow-up on ability to initiate TPN (delay start today due to possible line holiday) -Downgrade diet to full liquids -Ensure Enlive po TID, each supplement provides 350 kcal and 20 grams of protein -MVI with minerals daily  NUTRITION DIAGNOSIS:   Inadequate oral intake related to altered GI function as evidenced by (TPN dependent, s/p partial gastrectomy with roux en y due to gastric cancer).  GOAL:   Patient will meet greater than or equal to 90% of their needs  MONITOR:   PO intake, Labs, Weight trends, Skin, I & O's  REASON FOR ASSESSMENT:   Consult Enteral/tube feeding initiation and management  ASSESSMENT:   Timothy Garcia is a 84 y.o. male with medical history significant of COPD, chronic DVTs, diabetes, chronic kidney disease, hypertension, history of stomach cancer with distal gastrectomy, chronic kidney disease stage III, who presents to the ER, who is on 2 L at a skilled nursing facility presenting to the ER with shortness of breath and altered mental status.  Patient was found to be hypoxic requiring up to 4 L.  He is currently not communicating adequately.  He is lying down in bed on nonrebreather back.  Patient initially suspected of COVID-19 which is negative.  Further work-up done including chest radiograph showed no obvious PE or pneumonia.  He appears to have exacerbation of his COPD and is being admitted to the hospital for treatment.  Patient has tube feedings from previous surgery..  Pt admitted with sepsis suspected due to pneumonia.   Reviewed I/O's: +586 ml x 24 hours  UOP: 750 ml x 24 hours  Attempted to speak with pt via phone, however, unable to reach. Phone message reports "call cannot be completed as dialed".   Reviewed records from [facility]; pt underwent partial gastrectomy with roux en y on 04/30/18 for gastric cancer. J-tube  placement was attempted, however, unable to place due to narrow caliber of jejunum.   Called and spoke with Sheree ([facility]). She confirmed that pt was on a full liquid diet PTA. She confirmed that pt does not receive tube feedings (and has no feeding access). Pt is on chronic TPN through [facility], however, unsure of formulation. Per IV team notes, pt with intact PICC.   Reviewed wt records; pt with 4% wt loss over the past 2 months, which is not significant for time frame.   Case discussed with Dr. Starla Link via secure chat and discussed findings. Received verbal order for full liquid diet and TPN consult. Due to cut off, TPN will likely not be started until tomorrow, 07/18/18. Left message on secure chart to [facility] (TPN pharmacist) making her aware of consult.   ADDENDUM: Spoke with Barnetta Chapel (pharmacist) via secure chat. Plan to hold TPN due to GPCs in blood possible line holiday. Spoke with SLP Jonelle Sidle) who confirmed no plans for TPN and diet will remain on full liquids. She requested this RD optimize nutritional intake on full liquid diet.  Labs reviewed: CBGS: 183 (inpatient orders for glycemic control are 0-5 units insulin aspart q HS, 0-15 units insulin aspart TID with mrals, 15 units insulin glargine BID).   NUTRITION - FOCUSED PHYSICAL EXAM:    Most Recent Value  Orbital Region  Unable to assess  Upper Arm Region  Unable to assess  Thoracic and Lumbar Region  Unable to assess  Buccal Region  Unable to assess  [place] Region  Unable to  assess  Clavicle Bone Region  Unable to assess  Clavicle and Acromion Bone Region  Unable to assess  Scapular Bone Region  Unable to assess  Dorsal Hand  Unable to assess  Patellar Region  Unable to assess  Anterior Thigh Region  Unable to assess  Posterior Calf Region  Unable to assess  Edema (RD Assessment)  Unable to assess  Hair  Unable to assess  Eyes  Unable to assess  Mouth  Unable to assess  Skin  Unable to assess  Nails   Unable to assess       Diet Order:   Diet Order            Diet full liquid Room service appropriate? Yes; Fluid consistency: Thin  Diet effective now              EDUCATION NEEDS:   No education needs have been identified at this time  Skin:  Skin Assessment: Skin Integrity Issues: Skin Integrity Issues:: DTI DTI: lt leg  Last BM:  Unknown  Height:   Ht Readings from Last 1 Encounters:  07/16/18 5\' 7"  (1.702 m)    Weight:   Wt Readings from Last 1 Encounters:  07/17/18 73.5 kg    Ideal Body Weight:  67.3 kg  BMI:  Body mass index is 25.38 kg/m.  Estimated Nutritional Needs:   Kcal:  1800-2000  Protein:  90-105 grams  Fluid:  1.8-2.0 L    Cleatis Fandrich A. Jimmye Norman, RD, LDN, [place] Registered Dietitian II Certified Diabetes Care and Education Specialist Pager: [phone] After hours Pager: [phone]

## 2018-07-17 NOTE — TOC Initial Note (Signed)
Transition of Care [facility]) - Initial/Assessment Note    Patient Details  Name: Timothy Garcia MRN: [id number] Date of Birth: 1934/03/19  [facility] CM/SW Contact:    Benard Halsted, LCSW Phone Number: 07/17/2018, 1:30 PM  Clinical Narrative:                 84 year old male with history of COPD, chronic DVTs, diabetes, chronic renal disease, hypertension, stomach cancer with distal gastrectomy, chronic kidney disease stage III presented on 07/16/2018 from skilled nursing facility with shortness of breath and altered mental status.   CSW spoke with patient's sister. She reported patient has been at [facility] receiving TPN with [facility] and will return at discharge. [facility] aware.   Expected Discharge Plan: Skilled Nursing Facility Barriers to Discharge: Continued Medical Work up   Patient Goals and CMS Choice Patient states their goals for this hospitalization and ongoing recovery are:: Return to snf CMS Medicare.gov Compare Post Acute Care list provided to:: (NA-from SNF) Choice offered to / list presented to : Sibling  Expected Discharge Plan and Services Expected Discharge Plan: [place] In-house Referral: Clinical Social Work   Post Acute Care Choice: [place] Living arrangements for the past 2 months: [place]                 DME Arranged: N/A DME Agency: NA       HH Arranged: NA [place] Agency: NA        Prior Living Arrangements/Services Living arrangements for the past 2 months: [place] Lives with:: Self Patient language and need for interpreter reviewed:: Yes Do you feel safe going back to the place where you live?: Yes      Need for Family Participation in Patient Care: Yes (Comment) Care giver support system in place?: Yes (comment)   Criminal Activity/Legal Involvement Pertinent to Current Situation/Hospitalization: No - Comment as needed  Activities of Daily Living      Permission Sought/Granted Permission sought to share information with : Facility Sport and exercise psychologist, Family Supports Permission granted to share information with : No  Share Information with NAME: [name]  Permission granted to share info w AGENCY: [facility]  Permission granted to share info w Relationship: Sister  Permission granted to share info w Contact Information:    [phone]   Emotional Assessment Appearance:: Appears stated age Attitude/Demeanor/Rapport: Unable to Assess Affect (typically observed): Unable to Assess Orientation: : Oriented to Self Alcohol / Substance Use: Not Applicable Psych Involvement: No (comment)  Admission diagnosis:  ro Covid-19 Patient Active Problem List   Diagnosis Date Noted  . Sepsis ([place]) 07/16/2018  . Hyperglycemia 07/16/2018  . Benign essential HTN 07/16/2018  . AMS (altered mental status) 07/16/2018  . COPD with acute exacerbation ([place]) 07/16/2018  . CKD (chronic kidney disease) stage 3, GFR 30-59 ml/min (HCC) 07/16/2018  . Emphysema of lung ([place]) 07/16/2018  . NSVT (nonsustained ventricular tachycardia) ([place]) 07/16/2018   PCP:  Rogers Blocker, MD Pharmacy:  No Pharmacies Listed    Social Determinants of Health (SDOH) Interventions    Readmission Risk Interventions No flowsheet data found.

## 2018-07-17 NOTE — Progress Notes (Signed)
RT instructed patient on the use of flutter valve.  Patient able to demonstrate back good technique but he is weak.  Patient was able to have a strong congested cough following the flutter valve use.

## 2018-07-17 NOTE — Consult Note (Signed)
Consultation Note Date: 07/17/2018   Patient Name: Timothy Garcia  DOB: [birth year]  MRN: [id number]  Age / Sex: 84 y.o., male   PCP: Rogers Blocker, MD Referring Physician: Aline August, MD   REASON FOR CONSULTATION:Establishing goals of care  Palliative Care consult requested for this 84 y.o. 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 He was admitted from [facility] facility with complaints of hypoxia (84% 2L/[place] and wheezing. On arrival he was placed on non-rebreather. He has been receiving TPN via PICC at facility. CT of chest showed small bilateral pleural effusions with advanced left lysis. He was started on broad-spectrum antibiotics. He has been able to wean off NRB.    Clinical Assessment and Goals of Care: I have reviewed medical records including lab results, imaging, Epic notes, and MAR, received report from the bedside RN, and assessed the patient. I met at the bedside with patient to discuss diagnosis prognosis, GOC, EOL wishes, disposition and options. I also spoke with his sister, Timothy Garcia via phone. Patient was awake, alert, and oriented x3. He was able to engage appropriately in discussions.   I introduced Palliative Medicine as specialized medical care for people living with serious illness. It focuses on providing relief from the symptoms and stress of a serious illness. The goal is to improve quality of life for both the patient and the family.  We discussed a brief life review of the patient, along with overall functional and nutritional status. He reports he has 5 children, 4 live in [place], [place] and his daughter lives in [place]. He reports before he was sick he lived by himself with support of his sister. He states he was 1 of 13 siblings. He is a  retired Recruitment consultant. He is a Jehovah Witness.   He reports prior to his gastric surgery he was able to ambulate and provide most of his care independently. He states since surgery he has been at [facility] facility for rehab and recovery. He states he requires assistance with ADLs and uses a walker or wheelchair when transferring. He reports he has only had liquids by mouth since his surgery and tolerates well with occasional nausea. Sister verbalizes patient was living alone and able to provide care for himself prior to his illness. She states she escorted him to most of his appointments for support.    We discussed His current illness and what it means in the larger context of His on-going co-morbidities. With specific discussions regarding his COPD, respiratory failure, sepsis, and debility. Natural disease trajectory and expectations at EOL were discussed.  I attempted to elicit values and goals of care important to the patient.    The difference between aggressive medical intervention and comfort care was considered in light of the patient's goals of care. Patient expressed he would only want full aggressive medical interventions. He reports his medical team advised him of his good prognosis related to his cancer and surgical intervention. He also expresses based on religous belief the only care he would refuse would be blood products. He states "I want all care provided to me that is available and if I do not make it after the medical team does everything then Jehovah will decide from there!" Sister verbalized agreement with patient's wishes and also expresses she remains hopeful that he will soon improve  and return home at some point, whether it be to live with her or by himself.   I discussed with patient and sister his current full code status with consideration of his current illness and co-morbidities. He verbalized understanding and expressed wishes to remain a full code with full aggressive  measures. Both he and his sister remains hopeful for some improvement. Sister also states she is in agreement with full code status.   Patient reports he has an advance directive. He reports his sister Timothy Garcia is his medical decision maker if needed. She also confirms.   Hospice and Palliative Care services outpatient were explained and offered. Given patient and sister's expressed wishes for full aggressive medical interventions recommendations were made for palliative support outpatient.  Sister verbalized their understanding and awareness of both palliative and hospice's goals and philosophy of care. They have agreed to outpatient palliative support once he is discharged.   Questions and concerns were addressed. The family was encouraged to call with questions or concerns.  PMT will continue to support holistically.   SOCIAL HISTORY:     reports previous alcohol use. He reports that he does not use drugs.  CODE STATUS: Full code as confirmed by patient/Sister   ADVANCE DIRECTIVES: Primary Decision Maker: Patient/Timothy Garcia (sister)  HCPOA: Yes    SYMPTOM MANAGEMENT: per attending   Palliative Prophylaxis:   Bowel Regimen, Delirium Protocol, Frequent Pain Assessment, Oral Care and Turn Reposition  PSYCHO-SOCIAL/SPIRITUAL:  Support System: Family   Desire for further Chaplaincy support:NO   Additional Recommendations (Limitations, Scope, Preferences):  Full Scope Treatment   PAST MEDICAL HISTORY: Past Medical History:  Diagnosis Date  . Anemia   . CKD (chronic kidney disease) stage 3, GFR 30-59 ml/min (HCC)   . COPD (chronic obstructive pulmonary disease) ([place])   . Diabetes mellitus without complication ([place])    type 2  . Hypertension   . Hypoxia   . Malignant neoplasm of stomach ([place])   . Stenosis of carotid artery     PAST SURGICAL HISTORY: History reviewed. No pertinent surgical history.  ALLERGIES:  is allergic to ace inhibitors and iothalamate.    MEDICATIONS:  Current Facility-Administered Medications  Medication Dose Route Frequency Provider Last Rate Last Dose  . 0.9 %  sodium chloride infusion   Intravenous Continuous Aline August, MD 50 mL/hr at 07/17/18 1056    . acetaminophen (TYLENOL) tablet 650 mg  650 mg Oral Q6H PRN Garba, Mohammad L, MD      . albuterol (PROVENTIL) (2.5 MG/3ML) 0.083% nebulizer solution 2.5 mg  2.5 mg Nebulization Q6H PRN Gala Romney L, MD      . docusate sodium (COLACE) capsule 100 mg  100 mg Oral BID Gala Romney L, MD   100 mg at 07/17/18 0905  . enoxaparin (LOVENOX) injection 75 mg  75 mg Subcutaneous BID Rolla Flatten, [facility]      . guaiFENesin (ROBITUSSIN) 100 MG/5ML solution 100 mg  5 mL Oral Q6H Garba, Mohammad L, MD   100 mg at 07/17/18 0857  . hydrALAZINE (APRESOLINE) tablet 10 mg  10 mg Oral Q6H Elwyn Reach, MD   10 mg at 07/17/18 0905  . HYDROcodone-acetaminophen (NORCO/VICODIN) 5-325 MG per tablet 1 tablet  1 tablet Oral Q6H PRN Jonelle Sidle, Mohammad L, MD      . insulin aspart (novoLOG) injection 0-15 Units  0-15 Units Subcutaneous TID WC Elwyn Reach, MD   3 Units at 07/17/18 1228  . insulin aspart (novoLOG) injection 0-5  Units  0-5 Units Subcutaneous QHS Elwyn Reach, MD   2 Units at 07/16/18 2303  . insulin glargine (LANTUS) injection 15 Units  15 Units Subcutaneous BID Alekh, Kshitiz, MD      . ipratropium-albuterol (DUONEB) 0.5-2.5 (3) MG/3ML nebulizer solution 3 mL  3 mL Nebulization TID Elwyn Reach, MD   3 mL at 07/17/18 0801  . latanoprost (XALATAN) 0.005 % ophthalmic solution 1 drop  1 drop Both Eyes QHS Elwyn Reach, MD   1 drop at 07/16/18 2336  . lidocaine (LIDODERM) 5 % 1 patch  1 patch Transdermal Daily Garba, Mohammad L, MD      . magic mouthwash  5 mL Oral Q6H Garba, Mohammad L, MD   5 mL at 07/17/18 1057  . methylPREDNISolone sodium succinate (SOLU-MEDROL) 125 mg/2 mL injection 60 mg  60 mg Intravenous Q8H Alekh, Kshitiz, MD      . metoCLOPramide  (REGLAN) tablet 5 mg  5 mg Oral Q6H Gala Romney L, MD   5 mg at 07/17/18 0906  . metoprolol tartrate (LOPRESSOR) tablet 12.5 mg  12.5 mg Oral BID Gala Romney L, MD   12.5 mg at 07/17/18 0905  . ondansetron (ZOFRAN) tablet 4 mg  4 mg Oral Q6H PRN Elwyn Reach, MD       Or  . ondansetron (ZOFRAN) injection 4 mg  4 mg Intravenous Q6H PRN Gala Romney L, MD      . pantoprazole (PROTONIX) EC tablet 20 mg  20 mg Oral BID Elwyn Reach, MD   20 mg at 07/17/18 0905  . piperacillin-tazobactam (ZOSYN) IVPB 3.375 g  3.375 g Intravenous Q8H Elnora Morrison, MD 12.5 mL/hr at 07/17/18 0521 3.375 g at 07/17/18 0521  . sodium chloride flush (NS) 0.9 % injection 10-40 mL  10-40 mL Intracatheter PRN Garba, Mohammad L, MD      . timolol (TIMOPTIC) 0.5 % ophthalmic solution 1 drop  1 drop Both Eyes Daily Elwyn Reach, MD   1 drop at 07/17/18 1059  . umeclidinium-vilanterol (ANORO ELLIPTA) 62.5-25 MCG/INH 1 puff  1 puff Inhalation Daily Elwyn Reach, MD   1 puff at 07/17/18 0806  . vancomycin (VANCOCIN) 1,250 mg in sodium chloride 0.9 % 250 mL IVPB  1,250 mg Intravenous Q24H Elnora Morrison, MD        VITAL SIGNS: BP 117/63 (BP Location: Left Arm)   Pulse 86   Temp 97.9 F (36.6 C) (Oral)   Resp (!) 32   Ht '5\' 7"'  (1.702 m)   Wt 73.5 kg   SpO2 98%   BMI 25.38 kg/m  Filed Weights   07/16/18 1400 07/16/18 2115 07/17/18 0503  Weight: 76.6 kg 73.3 kg 73.5 kg    Estimated body mass index is 25.38 kg/m as calculated from the following:   Height as of this encounter: '5\' 7"'  (1.702 m).   Weight as of this encounter: 73.5 kg.  LABS: CBC:    Component Value Date/Time   WBC 10.0 07/17/2018 0408   HGB 8.1 (L) 07/17/2018 0408   HCT 27.0 (L) 07/17/2018 0408   PLT 266 07/17/2018 0408   Comprehensive Metabolic Panel:    Component Value Date/Time   NA 138 07/17/2018 0408   K 4.6 07/17/2018 0408   CO2 25 07/17/2018 0408   BUN 21 07/17/2018 0408   CREATININE 1.03 07/17/2018 0408    ALBUMIN 1.6 (L) 07/17/2018 0408     Review of Systems  Neurological: Positive for weakness.  Unless otherwise noted, a complete review of systems is negative.  Physical Exam General: NAD, chronically-ill appearing, thin elderly male Cardiovascular: regular rate and rhythm Pulmonary: diminished bases, crackles  Abdomen: soft, nontender, + bowel sounds GU: no suprapubic tenderness Extremities: no edema, no joint deformities Skin: no rashes, thin, dry Neurological: Weakness but otherwise nonfocal, awake & alert   Prognosis: Guarded in the setting of COPD exacerbation, sepsis, acute hypoxic respiratory failure, type 2 diabetes, CKD stage 3, NSVT, gastric cancer s/p gastrectomy and J-tube, generalized weakness, deconditioning, altered nutrition requiring TPN.   Discharge Planning:  [place] for rehab with Palliative care service follow-up  Recommendations:  Full Code-as requested by patient and sister (POA)   Continue to treat with full aggressive medical interventions.   Patient is a [place]   Patient and sister in agreement to outpatient palliative for support at discharge.   PMT will continue to support and follow as needed.    Palliative Performance Scale: PPS 30%              Patient and sister, Timothy Garcia expressed understanding and was in agreement with this plan.   The above conversation was completed via telephone due to the visitor restrictions during the COVID-19 pandemic. Thorough chart review and discussion with necessary members of the care team was completed as part of assessment.   Thank you for allowing the Palliative Medicine Team to assist in the care of this patient.  Time In: 1130 Time Out: 1235 Time Total: 65 min.   Visit consisted of counseling and education dealing with the complex and emotionally intense issues of symptom management and palliative care in the setting of serious and potentially life-threatening illness.Greater  than 50%  of this time was spent counseling and coordinating care related to the above assessment and plan.  Signed by:  Alda Lea, AGPCNP-BC Palliative Medicine Team  Phone: [phone] Pager: [phone] Amion: Bjorn Pippin

## 2018-07-17 NOTE — Progress Notes (Signed)
Patient desaturation at 60 with venturi mask at 8 lit.,and tachypnea at 36,BP was 116/77 and HR was 100, temp was 97.3 rectal. Patient was alert and oriented self during admission. Called respiratory and rapid on bed side and did EKG,12 lead, ABG, CBG and chest x-ray. Patient may have BIPAP as needed. Respiratory started o2 by venturi mask at 8 lit. spo2 is 100 %. Notified on call NP [place]. Patient was holding urine 680 ml .In and out cath done as per order output was 750 ml. Will continue monitor the patient.

## 2018-07-17 NOTE — NC FL2 (Addendum)
[place] LEVEL OF CARE SCREENING TOOL     IDENTIFICATION  Patient Name: Timothy Garcia Birthdate: [birth year] Sex: male Admission Date (Current Location): 07/16/2018  [facility] and [place] Number:  Herbalist and Address:  [facility] [place] [address] [place], [place] [address]      Provider Number: [id number]  Attending Physician Name and Address:  Aline August, MD  Relative Name and Phone Number:  Letitia Libra, sister, [phone]    Current Level of Care: Hospital Recommended Level of Care: [place] Prior Approval Number:    Date Approved/Denied:   PASRR Number: [id number]  Discharge Plan: SNF    Current Diagnoses: Patient Active Problem List   Diagnosis Date Noted  . Sepsis ([place]) 07/16/2018  . Hyperglycemia 07/16/2018  . Benign essential HTN 07/16/2018  . AMS (altered mental status) 07/16/2018  . COPD with acute exacerbation ([place]) 07/16/2018  . CKD (chronic kidney disease) stage 3, GFR 30-59 ml/min (HCC) 07/16/2018  . Emphysema of lung ([place]) 07/16/2018  . NSVT (nonsustained ventricular tachycardia) ([place]) 07/16/2018    Orientation RESPIRATION BLADDER Height & Weight     Self  O2(Nasal cannula 3L) Incontinent, External catheter Weight: 162 lb 0.6 oz (73.5 kg) Height:  5\' 7"  (170.2 cm)  BEHAVIORAL SYMPTOMS/MOOD NEUROLOGICAL BOWEL NUTRITION STATUS      Continent Diet(Please see DC Summary)  AMBULATORY STATUS COMMUNICATION OF NEEDS Skin   Limited Assist Verbally Other (Comment)(Deep tissue injury on leg)                       Personal Care Assistance Level of Assistance  Bathing, Feeding, Dressing Bathing Assistance: Maximum assistance Feeding assistance: Independent Dressing Assistance: Maximum assistance     Functional Limitations Info  Sight, Hearing, Speech Sight Info: Adequate Hearing Info: Adequate Speech Info: Adequate    SPECIAL CARE FACTORS FREQUENCY  PT (By licensed PT), OT (By  licensed OT)     PT Frequency: 5x/week OT Frequency: 3x/week            Contractures Contractures Info: Not present    Additional Factors Info  Insulin Sliding Scale Code Status Info: Full Allergies Info: Ace Inhibitors, Iothalamate   Insulin Sliding Scale Info: See DC Summary for dose       Current Medications (07/17/2018):  This is the current hospital active medication list Current Facility-Administered Medications  Medication Dose Route Frequency Provider Last Rate Last Dose  . 0.9 %  sodium chloride infusion   Intravenous Continuous Aline August, MD 50 mL/hr at 07/17/18 1056    . acetaminophen (TYLENOL) tablet 650 mg  650 mg Oral Q6H PRN Garba, Mohammad L, MD      . albuterol (PROVENTIL) (2.5 MG/3ML) 0.083% nebulizer solution 2.5 mg  2.5 mg Nebulization Q6H PRN Gala Romney L, MD      . docusate sodium (COLACE) capsule 100 mg  100 mg Oral BID Gala Romney L, MD   100 mg at 07/17/18 0905  . enoxaparin (LOVENOX) injection 75 mg  75 mg Subcutaneous BID Rolla Flatten, [facility]      . guaiFENesin (ROBITUSSIN) 100 MG/5ML solution 100 mg  5 mL Oral Q6H Garba, Mohammad L, MD   100 mg at 07/17/18 0857  . hydrALAZINE (APRESOLINE) tablet 10 mg  10 mg Oral Q6H Elwyn Reach, MD   10 mg at 07/17/18 0905  . HYDROcodone-acetaminophen (NORCO/VICODIN) 5-325 MG per tablet 1 tablet  1 tablet Oral Q6H PRN Gala Romney  L, MD      . insulin aspart (novoLOG) injection 0-15 Units  0-15 Units Subcutaneous TID WC Elwyn Reach, MD   3 Units at 07/17/18 1228  . insulin aspart (novoLOG) injection 0-5 Units  0-5 Units Subcutaneous QHS Elwyn Reach, MD   2 Units at 07/16/18 2303  . insulin glargine (LANTUS) injection 15 Units  15 Units Subcutaneous BID Alekh, Kshitiz, MD      . ipratropium-albuterol (DUONEB) 0.5-2.5 (3) MG/3ML nebulizer solution 3 mL  3 mL Nebulization TID Elwyn Reach, MD   3 mL at 07/17/18 0801  . latanoprost (XALATAN) 0.005 % ophthalmic solution 1 drop  1  drop Both Eyes QHS Elwyn Reach, MD   1 drop at 07/16/18 2336  . lidocaine (LIDODERM) 5 % 1 patch  1 patch Transdermal Daily Garba, Mohammad L, MD      . magic mouthwash  5 mL Oral Q6H Garba, Mohammad L, MD   5 mL at 07/17/18 1057  . methylPREDNISolone sodium succinate (SOLU-MEDROL) 125 mg/2 mL injection 60 mg  60 mg Intravenous Q8H Alekh, Kshitiz, MD      . metoCLOPramide (REGLAN) tablet 5 mg  5 mg Oral Q6H Gala Romney L, MD   5 mg at 07/17/18 0906  . metoprolol tartrate (LOPRESSOR) tablet 12.5 mg  12.5 mg Oral BID Gala Romney L, MD   12.5 mg at 07/17/18 0905  . ondansetron (ZOFRAN) tablet 4 mg  4 mg Oral Q6H PRN Elwyn Reach, MD       Or  . ondansetron (ZOFRAN) injection 4 mg  4 mg Intravenous Q6H PRN Gala Romney L, MD      . pantoprazole (PROTONIX) EC tablet 20 mg  20 mg Oral BID Elwyn Reach, MD   20 mg at 07/17/18 0905  . piperacillin-tazobactam (ZOSYN) IVPB 3.375 g  3.375 g Intravenous Q8H Elnora Morrison, MD 12.5 mL/hr at 07/17/18 0521 3.375 g at 07/17/18 0521  . sodium chloride flush (NS) 0.9 % injection 10-40 mL  10-40 mL Intracatheter PRN Garba, Mohammad L, MD      . timolol (TIMOPTIC) 0.5 % ophthalmic solution 1 drop  1 drop Both Eyes Daily Elwyn Reach, MD   1 drop at 07/17/18 1059  . umeclidinium-vilanterol (ANORO ELLIPTA) 62.5-25 MCG/INH 1 puff  1 puff Inhalation Daily Elwyn Reach, MD   1 puff at 07/17/18 0806  . vancomycin (VANCOCIN) 1,250 mg in sodium chloride 0.9 % 250 mL IVPB  1,250 mg Intravenous Q24H Elnora Morrison, MD         Discharge Medications: Please see discharge summary for a list of discharge medications.  Relevant Imaging Results:  Relevant Lab Results:   Additional Information SSN: [id number]   WPVXY negative on 07/16/18   TPN.  Benard Halsted, LCSW

## 2018-07-17 NOTE — Evaluation (Signed)
Clinical/Bedside Swallow Evaluation Patient Details  Name: Timothy Garcia MRN: [id number] Date of Birth: 01-24-1935  Today's Date: 07/17/2018 Time: SLP Start Time (ACUTE ONLY): 6270 SLP Stop Time (ACUTE ONLY): 1430 SLP Time Calculation (min) (ACUTE ONLY): 25 min  Past Medical History:  Past Medical History:  Diagnosis Date  . Anemia   . CKD (chronic kidney disease) stage 3, GFR 30-59 ml/min (HCC)   . COPD (chronic obstructive pulmonary disease) ([place])   . Diabetes mellitus without complication ([place])    type 2  . Hypertension   . Hypoxia   . Malignant neoplasm of stomach ([place])   . Stenosis of carotid artery    Past Surgical History: History reviewed. No pertinent surgical history. HPI:  Pt is an 84 yo male adm to [facility] from [facility] with shortness of breath concern for sepsis.  Pt also treated for pna and oral candidiasis with Augmentin, Protonix, Nystatin, and was on Reglan.  He does have h/o concerns for aspiration pneumonias.  Pt PMH + for distal stomach gastrectomy 04/2018 *pt denies this was cancerous, COPD, DM, CRD, CKD III, HTN.  Pt imaging studies showed severe emphysematous, small bilateral effusions with ATX.  CXR today 5/22 showed likely ATX/retrograde opacity.   He had a fever of 100.7 with WBC increase to 13.1 down to 10 today - RN reports MD is concerned that PICC may be site of infection and plans are to dc it.  Pt has been on a full liquid diet since his gastrectomy per his report.     Assessment / Plan / Recommendation Clinical Impression  Pt presents with negative CN exam regarding swallow musculature. His right eyebrow does not elevate fully - he reports h/o surgery in this area.  Voice and cough are strong.  Pt would only consume a few boluses as he didn't want to "ruin my dinner". Pt's RR was up to 33 per his tele however he denied dyspnea.     No indication of aspiration or dysphagia with po observed.  Pt politely declined to attempt 3 ounce [place] water test as he did  not want to "ruin his dinner".  SLP will follow up Monday with plans for this test due to his issues with possible recurrent pnas, aspirations.    Given pt has been on full liquid diet since his surgery 04/2018, would recommend continue this diet.  Reviewed aspiration precautions with pt using teach back. Also advised his RN and his nurse technician to recommendations.  Defer to pt for needed level of assist given his tremors.  In addition, pt does not use dentures to eat prior to admit.    Advised pt to ask his MD *Dr Delfina Redwood* at his rehab re: possiblity for dietary advancement. SLP recommend surgeon be contacted re: this possibility given his is s/p distal gastrectomy.   SLP Visit Diagnosis: Dysphagia, unspecified (R13.10)    Aspiration Risk  Mild aspiration risk    Diet Recommendation Thin liquid(full liquids)   Liquid Administration via: Cup;Straw Medication Administration: Crushed with puree(per pt) Supervision: Comment(defer to pt for level of assist, he has tremoring) Compensations: Slow rate;Small sips/bites    Other  Recommendations Oral Care Recommendations: Oral care BID   Follow up Recommendations        Frequency and Duration min 1 x/week  1 week       Prognosis Prognosis for Safe Diet Advancement: Fair Barriers to Reach Goals: Time post onset      Swallow Study   General Date of  Onset: 07/17/18 HPI: Pt is an 84 yo male adm to [facility] from [facility] with shortness of breath concern for sepsis.  Pt also treated for pna and oral candidiasis with Augmentin, Protonix, Nystatin, and was on Reglan.  He does have h/o concerns for aspiration pneumonias.  Pt PMH + for distal stomach gastrectomy 04/2018 *pt denies this was cancerous, COPD, DM, CRD, CKD III, HTN.  Pt imaging studies showed severe emphysematous, small bilateral effusions with ATX.  CXR today 5/22 showed likely ATX/retrograde opacity.   He had a fever of 100.7 with WBC increase to 13.1 down to 10 today - RN reports  MD is concerned that PICC may be site of infection and plans are to dc it.  Pt has been on a full liquid diet since his gastrectomy per his report.   Type of Study: Bedside Swallow Evaluation Previous Swallow Assessment: pt reports he had a FEES study at SNF Diet Prior to this Study: Thin liquids(was on regular but changed to fulls by dietician) Temperature Spikes Noted: Yes(100.7) Respiratory Status: Nasal cannula(he reports on 2 liters oxygen at night at home) History of Recent Intubation: No Behavior/Cognition: Alert;Cooperative;Pleasant mood Oral Cavity Assessment: Other (comment)(appears with oral candidasis) Oral Care Completed by SLP: No Oral Cavity - Dentition: Edentulous Vision: Functional for self-feeding Self-Feeding Abilities: Needs assist(today pt is tremorous) Patient Positioning: Upright in bed Baseline Vocal Quality: Normal Volitional Cough: Strong Volitional Swallow: Able to elicit    Oral/Motor/Sensory Function Overall Oral Motor/Sensory Function: Within functional limits   Ice Chips Ice chips: Not tested   Thin Liquid Thin Liquid: Within functional limits Presentation: Straw    Nectar Thick Nectar Thick Liquid: Not tested   Honey Thick Honey Thick Liquid: Not tested   Puree Puree: Within functional limits Presentation: Spoon   Solid     Solid: Not tested      Macario Golds 07/17/2018,3:15 PM

## 2018-07-17 NOTE — Evaluation (Signed)
Physical Therapy Evaluation Patient Details Name: Timothy Garcia MRN: [id number] DOB: 04/04/1934 Today's Date: 07/17/2018   History of Present Illness  Patient is a 84 y/o male who presents with SOB, AMS and fever; found to have sepsis and COPD exacerbation. Chest CT- small bil pleural effusions with atelectasis. PMH includes HTN, DM, COPD, CKD, stomach ca.   Clinical Impression  Patient presents with generalized weakness, decreased activity tolerance, dizziness, impaired balance and impaired mobility s/p above. Pt from [facility] and reports being dependent for ADLs. Pt uses w/c vs RW for mobility but needs help with transfers PTA. Today, pt tolerated bed mobility with Mod-Max A and standing with Max A and use of RW. Difficulty getting fully upright and extending through trunk/hips. Sp02 not reading great but ranged from 80-90% on 3L/min 02. Would benefit from return to SNF to maximize independence and mobility. Will follow acutely.    Follow Up Recommendations SNF;Supervision for mobility/OOB;Supervision/Assistance - 24 hour    Equipment Recommendations  None recommended by PT    Recommendations for Other Services       Precautions / Restrictions Precautions Precautions: Fall Precaution Comments: watch 02 Restrictions Weight Bearing Restrictions: No      Mobility  Bed Mobility Overal bed mobility: Needs Assistance Bed Mobility: Rolling;Sidelying to Sit;Sit to Sidelying Rolling: Min assist;Mod assist Sidelying to sit: HOB elevated;Max assist     Sit to sidelying: Mod assist;HOB elevated General bed mobility comments: Step by step cues to reach for rail; assist with bottom, trunk and BLEs to get to EOB. Assist to bring LEs into sidelying. Incontinent of bowels upon returining to bed. Rolling to right/left for pericare with Mod A.  Transfers Overall transfer level: Needs assistance Equipment used: Rolling walker (2 wheeled) Transfers: Sit to/from Stand Sit to Stand: Max  assist;From elevated surface         General transfer comment: Assist to power to standing with cues for hand placement/technique, difficulty extending fully through trunk and LEs, not able to get fully upright. Attempted x2 with sliding anteriorly.  Ambulation/Gait             General Gait Details: Unable  Stairs            Wheelchair Mobility    Modified Rankin (Stroke Patients Only)       Balance Overall balance assessment: Needs assistance Sitting-balance support: Feet supported;Single extremity supported Sitting balance-Leahy Scale: Poor Sitting balance - Comments: Requires UE support sitting EOB. + dizziness with drop in BP Postural control: Left lateral lean Standing balance support: During functional activity Standing balance-Leahy Scale: Zero Standing balance comment: Unable to get fully upright despite Max A and RW                             Pertinent Vitals/Pain Pain Assessment: No/denies pain    Home Living Family/patient expects to be discharged to:: Skilled nursing facility                 Additional Comments: From [facility].    Prior Function Level of Independence: Needs assistance   Gait / Transfers Assistance Needed: Reports using mainly w/c for mobility, sometimes RW and assist for transfers  ADL's / Homemaking Assistance Needed: Dependent for ADLs.        Hand Dominance        Extremity/Trunk Assessment   Upper Extremity Assessment Upper Extremity Assessment: Defer to OT evaluation    Lower Extremity Assessment Lower  Extremity Assessment: Generalized weakness    Cervical / Trunk Assessment Cervical / Trunk Assessment: Kyphotic  Communication   Communication: No difficulties  Cognition Arousal/Alertness: Awake/alert;Lethargic Behavior During Therapy: Flat affect Overall Cognitive Status: Impaired/Different from baseline Area of Impairment: Orientation;Attention;Following commands;Problem  solving                 Orientation Level: Disoriented to;Time;Situation Current Attention Level: Sustained   Following Commands: Follows one step commands with increased time;Follows multi-step commands inconsistently     Problem Solving: Slow processing;Decreased initiation;Difficulty sequencing;Requires verbal cues;Requires tactile cues General Comments: "June," nods yes to being in the hospital. Initially lethargic but this improved with sitting upright.      General Comments General comments (skin integrity, edema, etc.): Sp02 not reading accurately throughout ranging from 80s-90s on 3L/min 02.    Exercises     Assessment/Plan    PT Assessment Patient needs continued PT services  PT Problem List Decreased strength;Decreased balance;Decreased cognition;Cardiopulmonary status limiting activity;Decreased mobility;Decreased activity tolerance       PT Treatment Interventions Functional mobility training;Balance training;Patient/family education;Wheelchair mobility training;Gait training;Therapeutic activities;Therapeutic exercise;Cognitive remediation;DME instruction    PT Goals (Current goals can be found in the Care Plan section)  Acute Rehab PT Goals Patient Stated Goal: to go back to [facility] PT Goal Formulation: With patient Time For Goal Achievement: 07/31/18 Potential to Achieve Goals: Good    Frequency Min 2X/week   Barriers to discharge        Co-evaluation               AM-PAC PT "6 Clicks" Mobility  Outcome Measure Help needed turning from your back to your side while in a flat bed without using bedrails?: A Lot Help needed moving from lying on your back to sitting on the side of a flat bed without using bedrails?: A Lot Help needed moving to and from a bed to a chair (including a wheelchair)?: A Lot Help needed standing up from a chair using your arms (e.g., wheelchair or bedside chair)?: A Lot Help needed to walk in hospital room?:  Total Help needed climbing 3-5 steps with a railing? : Total 6 Click Score: 10    End of Session Equipment Utilized During Treatment: Oxygen;Gait belt Activity Tolerance: Patient limited by fatigue Patient left: in bed;with call bell/phone within reach;with bed alarm set Nurse Communication: Mobility status;Need for lift equipment PT Visit Diagnosis: Muscle weakness (generalized) (M62.81);Unsteadiness on feet (R26.81)    Time: 3570-1779 PT Time Calculation (min) (ACUTE ONLY): 32 min   Charges:   PT Evaluation $PT Eval Moderate Complexity: 1 Mod PT Treatments $Therapeutic Activity: 8-22 mins        Wray Kearns, PT, DPT Acute Rehabilitation Services Pager [phone] Office [place] 07/17/2018, 11:52 AM

## 2018-07-17 NOTE — Progress Notes (Signed)
PHARMACY - PHYSICIAN COMMUNICATION CRITICAL VALUE ALERT - BLOOD CULTURE IDENTIFICATION (BCID)  Timothy Garcia is an 84 y.o. male who presented to [facility] on 07/16/2018 with a chief complaint of SOB and AMS. Found to be hypoxic with temp 100.7 and elevated LA. CXR without obvious PE or PNA but given presentation, started on antibiotics. Admit BCx now 1/4 growing GPCs, BCID Staph species with mecA.  Name of physician (or Provider) ContactedStarla Link  Current antibiotics: Vancomycin and Zosyn  Changes to prescribed antibiotics recommended:  None - likely contaminant  Results for orders placed or performed during the hospital encounter of 07/16/18  Blood Culture ID Panel (Reflexed) (Collected: 07/16/2018  2:20 PM)  Result Value Ref Range   Enterococcus species NOT DETECTED NOT DETECTED   Listeria monocytogenes NOT DETECTED NOT DETECTED   Staphylococcus species DETECTED (A) NOT DETECTED   Staphylococcus aureus (BCID) NOT DETECTED NOT DETECTED   Methicillin resistance DETECTED (A) NOT DETECTED   Streptococcus species NOT DETECTED NOT DETECTED   Streptococcus agalactiae NOT DETECTED NOT DETECTED   Streptococcus pneumoniae NOT DETECTED NOT DETECTED   Streptococcus pyogenes NOT DETECTED NOT DETECTED   Acinetobacter baumannii NOT DETECTED NOT DETECTED   Enterobacteriaceae species NOT DETECTED NOT DETECTED   Enterobacter cloacae complex NOT DETECTED NOT DETECTED   Escherichia coli NOT DETECTED NOT DETECTED   Klebsiella oxytoca NOT DETECTED NOT DETECTED   Klebsiella pneumoniae NOT DETECTED NOT DETECTED   Proteus species NOT DETECTED NOT DETECTED   Serratia marcescens NOT DETECTED NOT DETECTED   Haemophilus influenzae NOT DETECTED NOT DETECTED   Neisseria meningitidis NOT DETECTED NOT DETECTED   Pseudomonas aeruginosa NOT DETECTED NOT DETECTED   Candida albicans NOT DETECTED NOT DETECTED   Candida glabrata NOT DETECTED NOT DETECTED   Candida krusei NOT DETECTED NOT DETECTED   Candida  parapsilosis NOT DETECTED NOT DETECTED   Candida tropicalis NOT DETECTED NOT DETECTED   Erin N. Gerarda Fraction, PharmD, [place] PGY2 Infectious Diseases Pharmacy Resident Phone: [phone] 07/17/2018  1:31 PM

## 2018-07-17 NOTE — Progress Notes (Signed)
RN placed IV team consult since no biopatch on PICC. Upon removing dressing, no biopatch or stat lock on PICC line. Dressing changed, biopatch and stat lock applied. Also noted patient has PIV in his right arm. Theodis Aguas, RN to remove this IV asap. Per CXR from today, PICC is in stable condition and okay to use.

## 2018-07-17 NOTE — Significant Event (Signed)
Rapid Response Event Note  Overview: Time Called: 0204 Arrival Time: 0207 Event Type: Respiratory(Desaturation to 60s, now on NRB)  Initial Focused Assessment: Upon arrival, [name] was somulent on NRB at 15L. He was easily arousable, followed simple commands and was oriented to self.  CBG 184. He denied SOB and any pain. He was tachypneic and mild respiratory distress with some abdominal accessory muscle use. No retractions. HR 99 SR with PACs, 116/77(84), RR 35 with sats 100% on 15L NRB.  No JVD. BBS CTA anteriorly with diminished bases with fine rales on right. Questionable ST elevation in Lead V on bedside monitor. Will check ABG and use BIPAP if needed.   Addendum: ABG 7.36/45/212/25  Will wean O2 for sats >92%  Interventions: -Stat ABG -12 lead EKG -Stat PCXR  Plan if not transferred: -Notify primary svc of events and further orders -Wean O2 for sats >92% -Notify primary svc and/or RRRN for further assistance  Event Summary: Stayed in room Call ended Timothy Garcia    Timothy Garcia

## 2018-07-17 NOTE — Progress Notes (Signed)
Patient received to the unit via bed. Patient is alert and oriented x2. Vital signs are stable. Iv in place and running fluid. Patient is on bed side monitor. Patient came with right upper arm  PICC line double lumen. Iv team notified. Skin assessment done with another nurse. Patient given given instructions about call bell and phone. Bed in low position,  and call bell in reach. Given schedule iv medicine. Patient didn't take oral medicine.

## 2018-07-18 LAB — COMPREHENSIVE METABOLIC PANEL WITH GFR
ALT: 55 U/L — ABNORMAL HIGH (ref 0–44)
AST: 45 U/L — ABNORMAL HIGH (ref 15–41)
Albumin: 1.6 g/dL — ABNORMAL LOW (ref 3.5–5.0)
Alkaline Phosphatase: 124 U/L (ref 38–126)
Anion gap: 8 (ref 5–15)
BUN: 26 mg/dL — ABNORMAL HIGH (ref 8–23)
CO2: 23 mmol/L (ref 22–32)
Calcium: 7.8 mg/dL — ABNORMAL LOW (ref 8.9–10.3)
Chloride: 109 mmol/L (ref 98–111)
Creatinine, Ser: 1.01 mg/dL (ref 0.61–1.24)
GFR calc Af Amer: >60 mL/min
GFR calc non Af Amer: >60 mL/min
Glucose, Bld: 110 mg/dL — ABNORMAL HIGH (ref 70–99)
Potassium: 4.2 mmol/L (ref 3.5–5.1)
Sodium: 140 mmol/L (ref 135–145)
Total Bilirubin: 0.5 mg/dL (ref 0.3–1.2)
Total Protein: 6.5 g/dL (ref 6.5–8.1)

## 2018-07-18 LAB — CBC WITH DIFFERENTIAL/PLATELET
Abs Immature Granulocytes: 0.06 K/uL (ref 0.00–0.07)
Basophils Absolute: 0 K/uL (ref 0.0–0.1)
Basophils Relative: 0 %
Eosinophils Absolute: 0 K/uL (ref 0.0–0.5)
Eosinophils Relative: 0 %
HCT: 26.6 % — ABNORMAL LOW (ref 39.0–52.0)
Hemoglobin: 8.2 g/dL — ABNORMAL LOW (ref 13.0–17.0)
Immature Granulocytes: 1 %
Lymphocytes Relative: 6 %
Lymphs Abs: 0.7 K/uL (ref 0.7–4.0)
MCH: 26.2 pg (ref 26.0–34.0)
MCHC: 30.8 g/dL (ref 30.0–36.0)
MCV: 85 fL (ref 80.0–100.0)
Monocytes Absolute: 0.4 K/uL (ref 0.1–1.0)
Monocytes Relative: 4 %
Neutro Abs: 10.5 K/uL — ABNORMAL HIGH (ref 1.7–7.7)
Neutrophils Relative %: 89 %
Platelets: 347 K/uL (ref 150–400)
RBC: 3.13 MIL/uL — ABNORMAL LOW (ref 4.22–5.81)
RDW: 14.8 % (ref 11.5–15.5)
WBC: 11.7 K/uL — ABNORMAL HIGH (ref 4.0–10.5)
nRBC: 0.3 % — ABNORMAL HIGH (ref 0.0–0.2)

## 2018-07-18 LAB — GLUCOSE, CAPILLARY
Glucose-Capillary: 105 mg/dL — ABNORMAL HIGH (ref 70–99)
Glucose-Capillary: 200 mg/dL — ABNORMAL HIGH (ref 70–99)
Glucose-Capillary: 230 mg/dL — ABNORMAL HIGH (ref 70–99)
Glucose-Capillary: 113 mg/dL — ABNORMAL HIGH (ref 70–99)

## 2018-07-18 LAB — MAGNESIUM: Magnesium: 1.9 mg/dL (ref 1.7–2.4)

## 2018-07-18 MED ORDER — ENOXAPARIN SODIUM 80 MG/0.8ML ~~LOC~~ SOLN
40.0000 mg | SUBCUTANEOUS | Status: DC
  Administered 2018-07-18 – 2018-07-21 (×4): 40 mg via SUBCUTANEOUS
  Filled 2018-07-18 (×4): qty 0.8

## 2018-07-18 MED ORDER — METHYLPREDNISOLONE SODIUM SUCC 40 MG IJ SOLR
40.0000 mg | Freq: Three times a day (TID) | INTRAMUSCULAR | Status: DC
  Administered 2018-07-18 – 2018-07-19 (×3): 40 mg via INTRAVENOUS
  Filled 2018-07-18 (×3): qty 1

## 2018-07-18 MED ORDER — INSULIN GLARGINE 100 UNIT/ML ~~LOC~~ SOLN
5.0000 [IU] | Freq: Every day | SUBCUTANEOUS | Status: DC
  Filled 2018-07-18: qty 0.05

## 2018-07-18 NOTE — Progress Notes (Signed)
Patient ID: Timothy Garcia, male   DOB: 10/13/1934, 84 y.o.   MRN: [id number]  PROGRESS NOTE    Timothy Garcia  IWL:[id number] DOB: [birth year] DOA: 07/16/2018 PCP: Rogers Blocker, MD   Brief Narrative:  84 year old male with history of COPD, chronic DVTs, diabetes, chronic renal disease, hypertension, stomach cancer with distal gastrectomy, chronic kidney disease stage III presented on 07/16/2018 from skilled nursing facility with shortness of breath and altered mental status.  He was initially hypoxic and needed nonrebreather.  COVID-19 testing was negative.  CT chest showed small bilateral pleural effusions with advanced left lysis but no PE and no obvious infiltrates.  Patient was started on broad-spectrum antibiotics.  Assessment & Plan:   Principal Problem:   Sepsis ([place]) Active Problems:   Hyperglycemia   Benign essential HTN   AMS (altered mental status)   COPD with acute exacerbation (HCC)   CKD (chronic kidney disease) stage 3, GFR 30-59 ml/min (HCC)   NSVT (nonsustained ventricular tachycardia) (HCC)  Sepsis: Present on admission -CT of the chest did not show obvious infiltrate. -Presented with fever and leukocytosis with altered mental status. -Currently on vancomycin and Zosyn.  Blood cultures growing methicillin-resistant coagulase-negative Staphylococcus in both aerobic and anaerobic bottles.  Unclear if this is a contaminant versus a real pathogen as patient presented with an indwelling PICC line.  PICC line was discontinued on 07/17/2018.  Repeat cultures from this morning are pending.  Continue vancomycin for now.  Discontinue Zosyn.  Acute hypoxic respiratory failure -May be secondary to COPD exacerbation -Patient was on nonrebreather on presentation.  Respiratory status improving. -Currently on 3 L oxygen via nasal cannula.  Wean off as able  COPD with exacerbation -Respiratory status improving.  Decrease Solu-Medrol to 40mg  IV every 8 hours.  Continue nebs and Anoro  Ellipta -Outpatient follow-up with pulmonary  Acute metabolic encephalopathy -Probably from above. -Patient was extremely confused on presentation.  Mental status has much improved but is still slightly confused.  Monitor mental status. -Fall precautions. -PT/OT/SLP evaluation -Vitamin B12, folate, TSH levels in a.m. -We will hold off on imaging of the brain for now.  Diabetes mellitus type 2 -Monitor CBGs with SSI.  Chronic kidney disease stage III -Creatinine stable.  Outpatient follow-up  History of gastric cancer status post partial gastrectomy currently on chronic TPN as an outpatient -TPN on hold currently because of bacteremia.  Follow cultures.  Once bacteremia is resolved, will have to insert a new PICC line and restart TPN.   -Continue diet as per SLP recommendations.   -Outpatient follow-up with oncology and general surgery.  Generalized debility -PT/OT. -Palliative care consult for goals of care appreciated.  Currently still full code.   DVT prophylaxis: Lovenox, will switch to prophylactic dose Code Status: Full Family Communication: Spoke to Sister [name] on phone on 07/17/2018 Disposition Plan: Back to SNF once clinically improved in the next 2 to 3 days  Consultants: Palliative care  Procedures: None  Antimicrobials: Vancomycin and Zosyn from 07/16/2018 onwards   Subjective: Patient seen and examined at bedside.  He is more awake this morning.  Answers some questions.  Only slightly confused.  No overnight fever or vomiting department nursing staff.  Objective: Vitals:   07/17/18 2109 07/18/18 0109 07/18/18 0509 07/18/18 0715  BP: 108/61 112/61 118/67   Pulse: 94 84 80   Resp: (!) 29 15 (!) 26   Temp:   (!) 97.4 F (36.3 C)   TempSrc:   Oral   SpO2: 98% 99%  98% 98%  Weight:      Height:        Intake/Output Summary (Last 24 hours) at 07/18/2018 1022 Last data filed at 07/18/2018 0602 Gross per 24 hour  Intake 820 ml  Output 270 ml  Net  550 ml   Filed Weights   07/16/18 1400 07/16/18 2115 07/17/18 0503  Weight: 76.6 kg 73.3 kg 73.5 kg    Examination:  General exam: Elderly male.  Lying in bed.  No acute distress.  Mental status has much improved.  Still slightly confused.   Respiratory system: Bilateral decreased breath sounds at bases with scattered crackles.  No wheezing.  Intermittent tachypnea.   Cardiovascular system: Rate controlled, S1-S2 heard Gastrointestinal system: Abdomen is nondistended, soft and nontender. Normal bowel sounds heard. Extremities: No cyanosis or edema Central nervous system: Slightly confused to time. No focal neurological deficits. Moving extremities Skin: No rashes, lesions or ulcers Psychiatry: Could not be evaluated because of mental status.    Data Reviewed: I have personally reviewed following labs and imaging studies  CBC: Recent Labs  Lab 07/16/18 1416 07/16/18 1435 07/17/18 0408 07/18/18 0826  WBC 13.1*  --  10.0 11.7*  NEUTROABS 10.5*  --   --  10.5*  HGB 9.5* 9.9* 8.1* 8.2*  HCT 32.3* 29.0* 27.0* 26.6*  MCV 89.5  --  86.5 85.0  PLT 372  --  266 818   Basic Metabolic Panel: Recent Labs  Lab 07/16/18 1416 07/16/18 1435 07/17/18 0408 07/18/18 0826  NA 138 137 138 140  K 5.6* 4.7 4.6 4.2  CL 103  --  105 109  CO2 27  --  25 23  GLUCOSE 323*  --  199* 110*  BUN 25*  --  21 26*  CREATININE 1.12  --  1.03 1.01  CALCIUM 9.0  --  8.2* 7.8*  MG  --   --   --  1.9   GFR: Estimated Creatinine Clearance: 53.6 mL/min (by C-G formula based on SCr of 1.01 mg/dL). Liver Function Tests: Recent Labs  Lab 07/16/18 1416 07/17/18 0408 07/18/18 0826  AST 20 19 45*  ALT 41 34 55*  ALKPHOS 148* 129* 124  BILITOT 0.1* 0.3 0.5  PROT 7.9 6.6 6.5  ALBUMIN 2.0* 1.6* 1.6*   Recent Labs  Lab 07/16/18 1416  LIPASE 51   No results for input(s): AMMONIA in the last 168 hours. Coagulation Profile: Recent Labs  Lab 07/16/18 1416  INR 1.2   Cardiac Enzymes: Recent  Labs  Lab 07/16/18 1416  TROPONINI <0.03   BNP (last 3 results) No results for input(s): PROBNP in the last 8760 hours. HbA1C: No results for input(s): HGBA1C in the last 72 hours. CBG: Recent Labs  Lab 07/17/18 0825 07/17/18 1157 07/17/18 1653 07/17/18 2046 07/18/18 0848  GLUCAP 183* 193* 142* 126* 105*   Lipid Profile: No results for input(s): CHOL, HDL, LDLCALC, TRIG, CHOLHDL, LDLDIRECT in the last 72 hours. Thyroid Function Tests: No results for input(s): TSH, T4TOTAL, FREET4, T3FREE, THYROIDAB in the last 72 hours. Anemia Panel: No results for input(s): VITAMINB12, FOLATE, FERRITIN, TIBC, IRON, RETICCTPCT in the last 72 hours. Sepsis Labs: Recent Labs  Lab 07/16/18 1416  LATICACIDVEN 1.4    Recent Results (from the past 240 hour(s))  Culture, blood (Routine x 2)     Status: None (Preliminary result)   Collection Time: 07/16/18  2:15 PM  Result Value Ref Range Status   Specimen Description BLOOD LEFT ANTECUBITAL  Final  Special Requests   Final    BOTTLES DRAWN AEROBIC AND ANAEROBIC Blood Culture results may not be optimal due to an inadequate volume of blood received in culture bottles   Culture  Setup Time   Final    GRAM POSITIVE COCCI IN BOTH AEROBIC AND ANAEROBIC BOTTLES CRITICAL VALUE NOTED.  VALUE IS CONSISTENT WITH PREVIOUSLY REPORTED AND CALLED VALUE. Performed at [facility], [place] [address] [place], [place] [address]    Culture [facility] POSITIVE COCCI  Final   Report Status PENDING  Incomplete  SARS Coronavirus 2 (CEPHEID- Performed in [place] hospital lab), Hosp Order     Status: None   Collection Time: 07/16/18  2:16 PM  Result Value Ref Range Status   SARS Coronavirus 2 NEGATIVE NEGATIVE Final    Comment: (NOTE) If result is NEGATIVE SARS-CoV-2 target nucleic acids are NOT DETECTED. The SARS-CoV-2 RNA is generally detectable in upper and lower  respiratory specimens during the acute phase of infection. The lowest  concentration of  SARS-CoV-2 viral copies this assay can detect is 250  copies / mL. A negative result does not preclude SARS-CoV-2 infection  and should not be used as the sole basis for treatment or other  patient management decisions.  A negative result may occur with  improper specimen collection / handling, submission of specimen other  than nasopharyngeal swab, presence of viral mutation(s) within the  areas targeted by this assay, and inadequate number of viral copies  (<250 copies / mL). A negative result must be combined with clinical  observations, patient history, and epidemiological information. If result is POSITIVE SARS-CoV-2 target nucleic acids are DETECTED. The SARS-CoV-2 RNA is generally detectable in upper and lower  respiratory specimens dur ing the acute phase of infection.  Positive  results are indicative of active infection with SARS-CoV-2.  Clinical  correlation with patient history and other diagnostic information is  necessary to determine patient infection status.  Positive results do  not rule out bacterial infection or co-infection with other viruses. If result is PRESUMPTIVE POSTIVE SARS-CoV-2 nucleic acids MAY BE PRESENT.   A presumptive positive result was obtained on the submitted specimen  and confirmed on repeat testing.  While 2019 novel coronavirus  (SARS-CoV-2) nucleic acids may be present in the submitted sample  additional confirmatory testing may be necessary for epidemiological  and / or clinical management purposes  to differentiate between  SARS-CoV-2 and other Sarbecovirus currently known to infect humans.  If clinically indicated additional testing with an alternate test  methodology [phone]) is advised. The SARS-CoV-2 RNA is generally  detectable in upper and lower respiratory sp ecimens during the acute  phase of infection. The expected result is Negative. Fact Sheet for Patients:  StrictlyIdeas.no Fact Sheet for Healthcare  Providers: BankingDealers.co.za This test is not yet approved or cleared by the [place] FDA and has been authorized for detection and/or diagnosis of SARS-CoV-2 by FDA under an Emergency Use Authorization (EUA).  This EUA will remain in effect (meaning this test can be used) for the duration of the COVID-19 declaration under Section 564(b)(1) of the Act, 21 U.S.C. section 360bbb-3(b)(1), unless the authorization is terminated or revoked sooner. Performed at [facility], [place] [address], [place], [place] [address]   Culture, blood (Routine x 2)     Status: None (Preliminary result)   Collection Time: 07/16/18  2:20 PM  Result Value Ref Range Status   Specimen Description BLOOD LEFT HAND  Final  Special Requests   Final    BOTTLES DRAWN AEROBIC AND ANAEROBIC Blood Culture results may not be optimal due to an inadequate volume of blood received in culture bottles   Culture  Setup Time   Final    GRAM POSITIVE COCCI ANAEROBIC BOTTLE ONLY CRITICAL RESULT CALLED TO, READ BACK BY AND VERIFIED WITH: Weldon Picking [id number] [id number] FCP Performed at [facility], [place] [address], [place], [place] [address]    Culture GRAM POSITIVE COCCI  Final   Report Status PENDING  Incomplete  Blood Culture ID Panel (Reflexed)     Status: Abnormal   Collection Time: 07/16/18  2:20 PM  Result Value Ref Range Status   Enterococcus species NOT DETECTED NOT DETECTED Final   Listeria monocytogenes NOT DETECTED NOT DETECTED Final   Staphylococcus species DETECTED (A) NOT DETECTED Final    Comment: Methicillin (oxacillin) resistant coagulase negative staphylococcus. Possible blood culture contaminant (unless isolated from more than one blood culture draw or clinical case suggests pathogenicity). No antibiotic treatment is indicated for blood  culture contaminants. CRITICAL RESULT CALLED TO, READ BACK BY AND VERIFIED WITH: PHARM D ERIN  D 1328 [id number] FCP    Staphylococcus  aureus (BCID) NOT DETECTED NOT DETECTED Final   Methicillin resistance DETECTED (A) NOT DETECTED Final    Comment: CRITICAL RESULT CALLED TO, READ BACK BY AND VERIFIED WITH: PHARM D ERIN  D 1328 [id number] FCP    Streptococcus species NOT DETECTED NOT DETECTED Final   Streptococcus agalactiae NOT DETECTED NOT DETECTED Final   Streptococcus pneumoniae NOT DETECTED NOT DETECTED Final   Streptococcus pyogenes NOT DETECTED NOT DETECTED Final   Acinetobacter baumannii NOT DETECTED NOT DETECTED Final   Enterobacteriaceae species NOT DETECTED NOT DETECTED Final   Enterobacter cloacae complex NOT DETECTED NOT DETECTED Final   Escherichia coli NOT DETECTED NOT DETECTED Final   Klebsiella oxytoca NOT DETECTED NOT DETECTED Final   Klebsiella pneumoniae NOT DETECTED NOT DETECTED Final   Proteus species NOT DETECTED NOT DETECTED Final   Serratia marcescens NOT DETECTED NOT DETECTED Final   Haemophilus influenzae NOT DETECTED NOT DETECTED Final   Neisseria meningitidis NOT DETECTED NOT DETECTED Final   Pseudomonas aeruginosa NOT DETECTED NOT DETECTED Final   Candida albicans NOT DETECTED NOT DETECTED Final   Candida glabrata NOT DETECTED NOT DETECTED Final   Candida krusei NOT DETECTED NOT DETECTED Final   Candida parapsilosis NOT DETECTED NOT DETECTED Final   Candida tropicalis NOT DETECTED NOT DETECTED Final    Comment: Performed at [facility], [address] [place], [place] [address]  Urine culture     Status: None   Collection Time: 07/16/18  4:17 PM  Result Value Ref Range Status   Specimen Description URINE, RANDOM  Final   Special Requests NONE  Final   Culture   Final    NO GROWTH Performed at [facility], [place] [address], [place], [place] [address]    Report Status 07/17/2018 FINAL  Final  MRSA PCR Screening     Status: None   Collection Time: 07/16/18 11:44 PM  Result Value Ref Range Status   MRSA by PCR NEGATIVE NEGATIVE Final    Comment:        The GeneXpert MRSA  Assay (FDA approved for NASAL specimens only), is one component of a comprehensive MRSA colonization surveillance program. It is not intended to diagnose MRSA infection nor to guide or monitor treatment for MRSA infections. Performed at  [facility], [place] [address] [place], [place] [address]          Radiology Studies: Ct Angio Chest Pe W And/or Wo Contrast  Result Date: 07/16/2018 CLINICAL DATA:  Pneumonia.  Shortness of breath. EXAM: CT ANGIOGRAPHY CHEST WITH CONTRAST TECHNIQUE: Multidetector CT imaging of the chest was performed using the standard protocol during bolus administration of intravenous contrast. Multiplanar CT image reconstructions and MIPs were obtained to evaluate the vascular anatomy. CONTRAST:  164mL OMNIPAQUE IOHEXOL 350 MG/ML SOLN COMPARISON:  CT chest dated 02/09/2015. FINDINGS: Cardiovascular: Evaluation is somewhat limited by motion artifact and streak artifact from the patient's arms. Given this limitation, no definite PE identified on today's exam. Coronary artery calcifications are noted. Atherosclerotic changes are noted of the thoracic aorta. The heart size is not significantly enlarged. Mediastinum/Nodes: No enlarged mediastinal, hilar, or axillary lymph nodes. Thyroid gland, trachea, and esophagus demonstrate no significant findings. Lungs/Pleura: Evaluation is limited by motion artifact. Again identified are extensive emphysematous changes bilaterally. There is chronic scarring at the right lung apex. There is atelectasis involving the bilateral lower lobes with near complete collapse of the left lower lobe. The trachea is unremarkable. There are small bilateral pleural effusions. Upper Abdomen: The patient appears to be status post prior gastric bypass. The remaining portions of the partially visualized upper abdomen are grossly unremarkable. Musculoskeletal: No chest wall abnormality. No acute or significant osseous findings. Bilateral gynecomastia is  noted. Review of the MIP images confirms the above findings. IMPRESSION: 1. Examination is limited by motion artifact and streak artifact from the patient's arms. 2. Given the limitations described above, no PE identified. Detection of pulmonary emboli at the segmental and subsegmental levels is severely limited. 3. Severe emphysematous changes. Stable scarring in the right upper lobe. 4. Small bilateral pleural effusions with adjacent atelectasis. Aortic Atherosclerosis (ICD10-I70.0) and Emphysema (ICD10-J43.9). Electronically Signed   By: Constance Holster M.D.   On: 07/16/2018 17:15   Dg Chest Port 1 View  Result Date: 07/17/2018 CLINICAL DATA:  84 year old male acute respiratory distress. EXAM: PORTABLE CHEST 1 VIEW COMPARISON:  CTA chest yesterday, and earlier. FINDINGS: Portable AP semi upright view at 0229 hours. Stable right PICC line. Stable cardiac size and mediastinal contours. Stable lung volumes. Bilateral layering pleural effusions were better demonstrated by CTA. Evidence of upper lobe predominant emphysema again noted. Confluent retrocardiac opacity persists, but most resembles atelectasis on CTA. No areas of worsening ventilation. Visualized tracheal air column is within normal limits. Negative visible bowel gas pattern. No acute osseous abnormality identified. IMPRESSION: Stable ventilation since yesterday. Pleural effusions and lung base atelectasis superimposed on emphysema as seen by CTA. Electronically Signed   By: Genevie Ann M.D.   On: 07/17/2018 03:11   Dg Chest Port 1 View  Result Date: 07/16/2018 CLINICAL DATA:  Pt here from [facility], facility reports pneumonia x 1 week, having SOB today EXAM: PORTABLE CHEST - 1 VIEW COMPARISON:  06/26/2018 FINDINGS: Right arm PICC line to the SVC. Left retrocardiac consolidation/atelectasis perhaps slightly improved. Probable small left pleural effusion as before. Central pulmonary vascular congestion. Heart size upper limits normal for technique.  Aortic Atherosclerosis (ICD10-170.0). No pneumothorax. Visualized bones unremarkable. IMPRESSION: 1. Left retrocardiac consolidation/atelectasis and small effusion, slightly improved. 2. Pulmonary vascular congestion Electronically Signed   By: Lucrezia Europe M.D.   On: 07/16/2018 15:20        Scheduled Meds:  docusate sodium  100 mg Oral BID   enoxaparin  75 mg Subcutaneous BID  feeding supplement (ENSURE ENLIVE)  237 mL Oral TID BM   guaiFENesin  5 mL Oral Q6H   hydrALAZINE  10 mg Oral Q6H   insulin aspart  0-15 Units Subcutaneous TID WC   insulin aspart  0-5 Units Subcutaneous QHS   insulin glargine  15 Units Subcutaneous BID   ipratropium-albuterol  3 mL Nebulization TID   latanoprost  1 drop Both Eyes QHS   lidocaine  1 patch Transdermal Daily   magic mouthwash  5 mL Oral Q6H   methylPREDNISolone (SOLU-MEDROL) injection  60 mg Intravenous Q8H   metoCLOPramide  5 mg Oral Q6H   metoprolol tartrate  12.5 mg Oral BID   multivitamin with minerals  1 tablet Oral Daily   pantoprazole  20 mg Oral BID   timolol  1 drop Both Eyes Daily   umeclidinium-vilanterol  1 puff Inhalation Daily   Continuous Infusions:  sodium chloride 50 mL/hr at 07/17/18 1428   piperacillin-tazobactam (ZOSYN)  IV 3.375 g (07/18/18 0522)   vancomycin 1,250 mg (07/17/18 1616)     LOS: 2 days        Aline August, MD [facility] 07/18/2018, 10:22 AM

## 2018-07-18 NOTE — Evaluation (Signed)
Occupational Therapy Evaluation Patient Details Name: Timothy Garcia MRN: [id number] DOB: Mar 29, 1934 Today's Date: 07/18/2018    History of Present Illness Patient is a 84 y/o male who presents with SOB, AMS and fever; found to have sepsis and COPD exacerbation. Chest CT- small bil pleural effusions with atelectasis. PMH includes HTN, DM, COPD, CKD, stomach ca.    Clinical Impression   Pt PTA: from SNF as residence. Pt mostly transferred to and from w/c and required assist with ADL. Pt currently performing bed mobility with maxA; sit to stands- unable to fully stand upright with b/l flexed knees. Pt sitting EOB x10 mins for grooming tasks with set-upA and fair dynamic sitting balance. O2 required 4L for 88% upon exertion and able to pursed lip breathe for >90% on 3L once resting. Pt limited by poor activity tolerance, decreased  Strength and constant cueing required for sequencing. Pt would benefit from continued OT skilled services for ADL, mobility and safety. OT to follow acutely.       Follow Up Recommendations  SNF;Supervision/Assistance - 24 hour    Equipment Recommendations  None recommended by OT    Recommendations for Other Services       Precautions / Restrictions Precautions Precautions: Fall Precaution Comments: watch 02 Restrictions Weight Bearing Restrictions: No      Mobility Bed Mobility Overal bed mobility: Needs Assistance Bed Mobility: Rolling;Sidelying to Sit;Sit to Sidelying Rolling: Max assist Sidelying to sit: HOB elevated;Max assist     Sit to sidelying: Max assist General bed mobility comments: pt performing tasks with assist for trunk elevation and BLE movement.  Transfers Overall transfer level: Needs assistance Equipment used: Rolling walker (2 wheeled) Transfers: Sit to/from Stand Sit to Stand: Max assist;From elevated surface         General transfer comment: Assist to power to standing with cues for hand placement/technique, difficulty  extending fully through trunk and LEs, not able to get fully upright. Attempted x2 with sliding anteriorly.    Balance Overall balance assessment: Needs assistance Sitting-balance support: Feet supported;Single extremity supported Sitting balance-Leahy Scale: Fair Sitting balance - Comments: No dizziness today able to groom in sitting                                   ADL either performed or assessed with clinical judgement   ADL Overall ADL's : At baseline                                       General ADL Comments: pt with increased weakness and poor mobility     Vision Baseline Vision/History: No visual deficits Vision Assessment?: No apparent visual deficits     Perception     Praxis      Pertinent Vitals/Pain Pain Assessment: No/denies pain     Hand Dominance Right   Extremity/Trunk Assessment Upper Extremity Assessment Upper Extremity Assessment: Generalized weakness   Lower Extremity Assessment Lower Extremity Assessment: Generalized weakness;LLE deficits/detail LLE Deficits / Details: Pt with increased weakness and inability to perform sit to stands   Cervical / Trunk Assessment Cervical / Trunk Assessment: Kyphotic   Communication Communication Communication: No difficulties   Cognition Arousal/Alertness: Awake/alert;Lethargic Behavior During Therapy: WFL for tasks assessed/performed Overall Cognitive Status: Impaired/Different from baseline Area of Impairment: Orientation;Attention;Following commands;Problem solving  Orientation Level: Disoriented to;Time;Situation Current Attention Level: Sustained   Following Commands: Follows one step commands with increased time;Follows multi-step commands inconsistently     Problem Solving: Slow processing;Decreased initiation;Difficulty sequencing;Requires verbal cues;Requires tactile cues     General Comments  O2 required 3 to 4L O2 [place] due to SOB and  dessatting to 88% at 3L     Exercises     Shoulder Instructions      Home Living Family/patient expects to be discharged to:: Skilled nursing facility                                 Additional Comments: From [facility].      Prior Functioning/Environment Level of Independence: Needs assistance  Gait / Transfers Assistance Needed: Reports using mainly w/c for mobility, sometimes RW and assist for transfers ADL's / Homemaking Assistance Needed: pt set-upA for grooming and feeding; otherwise requires assist            OT Problem List: Decreased strength;Decreased activity tolerance;Impaired balance (sitting and/or standing);Decreased safety awareness;Pain;Decreased coordination      OT Treatment/Interventions: Self-care/ADL training;Therapeutic exercise;Neuromuscular education;Energy conservation;Therapeutic activities;Patient/family education;Balance training    OT Goals(Current goals can be found in the care plan section) Acute Rehab OT Goals Patient Stated Goal: to go back to [facility] OT Goal Formulation: With patient Time For Goal Achievement: 08/01/18 Potential to Achieve Goals: Good ADL Goals Pt Will Perform Grooming: with modified independence;sitting Pt Will Transfer to Toilet: with max assist;squat pivot transfer;bedside commode Pt Will Perform Tub/Shower Transfer: 3 in 1;Squat pivot transfer;with max assist Additional ADL Goal #1: Pt will sit EOB x15 mins with good balance for ADL with overall minA  OT Frequency: Min 2X/week   Barriers to D/C:            Co-evaluation              AM-PAC OT "6 Clicks" Daily Activity     Outcome Measure Help from another person eating meals?: None Help from another person taking care of personal grooming?: A Little Help from another person toileting, which includes using toliet, bedpan, or urinal?: A Lot Help from another person bathing (including washing, rinsing, drying)?: A Lot Help from  another person to put on and taking off regular upper body clothing?: A Little Help from another person to put on and taking off regular lower body clothing?: Total 6 Click Score: 15   End of Session Equipment Utilized During Treatment: Gait belt;Rolling walker Nurse Communication: Mobility status  Activity Tolerance: Patient tolerated treatment well Patient left: in bed;with call bell/phone within reach;with bed alarm set  OT Visit Diagnosis: Unsteadiness on feet (R26.81);Muscle weakness (generalized) (M62.81)                Time: 1779-3903 OT Time Calculation (min): 30 min Charges:  OT General Charges $OT Visit: 1 Visit OT Evaluation $OT Eval Moderate Complexity: 1 Mod OT Treatments $Self Care/Home Management : 8-22 mins  Ebony Hail Harold Hedge) Marsa Aris OTR/L Acute Rehabilitation Services Pager: [phone] Office: [place] 07/18/2018, 3:52 PM

## 2018-07-18 NOTE — Progress Notes (Signed)
Patient currently on [facility] and is resting comfortably. All vitals are stable. BIPAP is not needed at this time.

## 2018-07-19 DIAGNOSIS — R7881 Bacteremia: Secondary | ICD-10-CM

## 2018-07-19 LAB — CULTURE, BLOOD (ROUTINE X 2)

## 2018-07-19 LAB — CBC WITH DIFFERENTIAL/PLATELET
Abs Immature Granulocytes: 0.06 10*3/uL (ref 0.00–0.07)
Basophils Absolute: 0 10*3/uL (ref 0.0–0.1)
Basophils Relative: 0 %
Eosinophils Absolute: 0 10*3/uL (ref 0.0–0.5)
Eosinophils Relative: 0 %
HCT: 27.3 % — ABNORMAL LOW (ref 39.0–52.0)
Hemoglobin: 8.4 g/dL — ABNORMAL LOW (ref 13.0–17.0)
Immature Granulocytes: 1 %
Lymphocytes Relative: 7 %
Lymphs Abs: 0.7 10*3/uL (ref 0.7–4.0)
MCH: 26.1 pg (ref 26.0–34.0)
MCHC: 30.8 g/dL (ref 30.0–36.0)
MCV: 84.8 fL (ref 80.0–100.0)
Monocytes Absolute: 0.6 10*3/uL (ref 0.1–1.0)
Monocytes Relative: 6 %
Neutro Abs: 8.4 10*3/uL — ABNORMAL HIGH (ref 1.7–7.7)
Neutrophils Relative %: 86 %
Platelets: 385 10*3/uL (ref 150–400)
RBC: 3.22 MIL/uL — ABNORMAL LOW (ref 4.22–5.81)
RDW: 14.7 % (ref 11.5–15.5)
WBC: 9.8 10*3/uL (ref 4.0–10.5)
nRBC: 0 % (ref 0.0–0.2)

## 2018-07-19 LAB — GLUCOSE, CAPILLARY
Glucose-Capillary: 111 mg/dL — ABNORMAL HIGH (ref 70–99)
Glucose-Capillary: 118 mg/dL — ABNORMAL HIGH (ref 70–99)
Glucose-Capillary: 125 mg/dL — ABNORMAL HIGH (ref 70–99)
Glucose-Capillary: 52 mg/dL — ABNORMAL LOW (ref 70–99)
Glucose-Capillary: 62 mg/dL — ABNORMAL LOW (ref 70–99)
Glucose-Capillary: 66 mg/dL — ABNORMAL LOW (ref 70–99)
Glucose-Capillary: 71 mg/dL (ref 70–99)

## 2018-07-19 LAB — VITAMIN B12: Vitamin B-12: 670 pg/mL (ref 180–914)

## 2018-07-19 LAB — MAGNESIUM: Magnesium: 2 mg/dL (ref 1.7–2.4)

## 2018-07-19 LAB — BASIC METABOLIC PANEL
Anion gap: 10 (ref 5–15)
BUN: 23 mg/dL (ref 8–23)
CO2: 23 mmol/L (ref 22–32)
Calcium: 7.9 mg/dL — ABNORMAL LOW (ref 8.9–10.3)
Chloride: 110 mmol/L (ref 98–111)
Creatinine, Ser: 0.92 mg/dL (ref 0.61–1.24)
GFR calc Af Amer: >60 mL/min (ref >60)
GFR calc non Af Amer: >60 mL/min (ref >60)
Glucose, Bld: 67 mg/dL — ABNORMAL LOW (ref 70–99)
Potassium: 3.4 mmol/L — ABNORMAL LOW (ref 3.5–5.1)
Sodium: 143 mmol/L (ref 135–145)

## 2018-07-19 LAB — AMMONIA: Ammonia: 15 umol/L (ref 9–35)

## 2018-07-19 LAB — TSH: TSH: 2.185 u[IU]/mL (ref 0.350–4.500)

## 2018-07-19 LAB — FOLATE: Folate: 13.8 ng/mL (ref >5.9)

## 2018-07-19 MED ORDER — POTASSIUM CHLORIDE CRYS ER 20 MEQ PO TBCR
40.0000 meq | EXTENDED_RELEASE_TABLET | Freq: Once | ORAL | Status: AC
  Administered 2018-07-19: 40 meq via ORAL
  Filled 2018-07-19: qty 2

## 2018-07-19 MED ORDER — METHYLPREDNISOLONE SODIUM SUCC 40 MG IJ SOLR
40.0000 mg | Freq: Two times a day (BID) | INTRAMUSCULAR | Status: DC
  Administered 2018-07-19 – 2018-07-20 (×2): 40 mg via INTRAVENOUS
  Filled 2018-07-19 (×3): qty 1

## 2018-07-19 NOTE — Progress Notes (Signed)
Pharmacy Antibiotic Note  Timothy Garcia is a 84 y.o. male admitted on 07/16/2018 with sepsis.  Pharmacy has been consulted for vancomycin dosing.   Staph epi growing in 2/2 blood cultures from 5/21. Repeat blood cultures drawn 5/23. Per MD, if repeat cultures are no growth tomorrow, can replace IV lines and stop antibiotics.  Plan: Vancomycin 1250 mg IV Q 24 hrs. Goal AUC 400-550. Expected AUC: 443 SCr used: 0.92 Monitor clinical status, renal function, cultures, and length of therapy Monitor vancomycin levels as needed  Height: 5\' 7"  (170.2 cm) Weight: 162 lb 0.6 oz (73.5 kg) IBW/kg (Calculated) : 66.1  Temp (24hrs), Avg:97.6 F (36.4 C), Min:97.5 F (36.4 C), Max:97.6 F (36.4 C)  Recent Labs  Lab 07/16/18 1416 07/17/18 0408 07/18/18 0826 07/19/18 0245  WBC 13.1* 10.0 11.7* 9.8  CREATININE 1.12 1.03 1.01 0.92  LATICACIDVEN 1.4  --   --   --     Estimated Creatinine Clearance: 58.9 mL/min (by C-G formula based on SCr of 0.92 mg/dL).    Allergies  Allergen Reactions  . Ace Inhibitors Other (See Comments)    Unable to recall Unable to recall   . Iothalamate Other (See Comments)    Patient does not know if allergic to this; able to receive current IV contrast (Omnipaque 350) without adverse effects     Antimicrobials this admission: Vancomycin 5/21 >> Zosyn 5/21 >>5/23  Dose adjustments this admission:   Microbiology results: 5/21 MRSA PCR >> neg 5/21 UCx >> ngF 5/21 BCx >> 1/2 GPC - possible contaminant 5/23 BCx >> sent  Thank you for allowing pharmacy to be a part of this patient's care.  Claiborne Billings, PharmD PGY2 Cardiology Pharmacy Resident Please check AMION for all Pharmacist numbers by unit 07/19/2018 8:18 AM

## 2018-07-19 NOTE — Progress Notes (Signed)
Patient ID: Timothy Garcia, male   DOB: 10-29-1934, 85 y.o.   MRN: [id number]  PROGRESS NOTE    Timothy Garcia  SHF:[id number] DOB: 09-28-1934 DOA: 07/16/2018 PCP: Rogers Blocker, MD   Brief Narrative:  84 year old male with history of COPD, chronic DVTs, diabetes, chronic renal disease, hypertension, stomach cancer with distal gastrectomy, chronic kidney disease stage III presented on 07/16/2018 from skilled nursing facility with shortness of breath and altered mental status.  He was initially hypoxic and needed nonrebreather.  COVID-19 testing was negative.  CT chest showed small bilateral pleural effusions with adjacent atelectasis but no PE and no obvious infiltrates.  Patient was started on broad-spectrum antibiotics.  Assessment & Plan:   Principal Problem:   Sepsis ([place]) Active Problems:   Hyperglycemia   Benign essential HTN   AMS (altered mental status)   COPD with acute exacerbation (HCC)   CKD (chronic kidney disease) stage 3, GFR 30-59 ml/min (HCC)   NSVT (nonsustained ventricular tachycardia) (HCC)  Sepsis: Present on admission -CT of the chest did not show obvious infiltrate. -Presented with fever and leukocytosis with altered mental status. -Started on vancomycin and Zosyn.  Blood cultures growing staph epidermidis; susceptibilities to follow.  Unclear if this is a contaminant versus a real pathogen as patient presented with an indwelling PICC line.  PICC line was discontinued on 07/17/2018.  Repeat cultures from 07/18/2018 is negative so far.  Continue vancomycin for now.  Zosyn discontinued on 07/18/2018.  If repeat cultures remain negative till 07/20/2018, will discontinue vancomycin.  Acute hypoxic respiratory failure -May be secondary to COPD exacerbation -Patient was on nonrebreather on presentation.  Respiratory status improving. -Currently on 3 L oxygen via nasal cannula.  Wean off as able. -Repeat chest x-ray in a.m.  COPD with exacerbation -Respiratory status improving.   Decrease Solu-Medrol to 40mg  IV every 12 hours.  Continue nebs and Anoro Ellipta -Outpatient follow-up with pulmonary  Acute metabolic encephalopathy -Probably from above. -Patient was extremely confused on presentation.  Mental status has much improved but is still slightly confused.  Monitor mental status. -Fall precautions. -PT/OT/SLP evaluation -Vitamin B12, folate, TSH levels normal. -We will hold off on imaging of the brain for now.  Diabetes mellitus type 2 -Monitor CBGs with SSI.  Chronic kidney disease stage III -Creatinine stable.  Outpatient follow-up  History of gastric cancer status post partial gastrectomy currently on chronic TPN as an outpatient -TPN on hold currently because of bacteremia.  Follow cultures.  Once bacteremia is resolved, will have to insert a new PICC line and restart TPN.   -Continue diet as per SLP recommendations.   -Outpatient follow-up with oncology and general surgery.  Generalized debility -PT/OT. -Palliative care consult for goals of care appreciated.  Currently still full code.   DVT prophylaxis: Lovenox, will switch to prophylactic dose Code Status: Full Family Communication: Spoke to Sister [name] on phone on 07/17/2018 Disposition Plan: Back to SNF once clinically improved in the next 2 to 3 days  Consultants: Palliative care  Procedures: None  Antimicrobials: Vancomycin from 07/16/2018 onwards Zosyn from 07/16/2018-07/18/2018   Subjective: Patient seen and examined at bedside.  He is more awake this morning.  Hard of hearing, answers some questions.  Still slightly confused.  No overnight fever, worsening shortness of breath or vomiting reported by nursing staff.  Objective: Vitals:   07/18/18 2352 07/19/18 0319 07/19/18 0320 07/19/18 0605  BP: 133/64 139/68 139/68 (!) 150/97  Pulse: 80 95 89 89  Resp: (!) 28 20  19 (!) 26  Temp: 97.6 F (36.4 C) 97.6 F (36.4 C)    TempSrc: Oral Oral    SpO2: 100% 93% 92% 97%   Weight:      Height:        Intake/Output Summary (Last 24 hours) at 07/19/2018 0750 Last data filed at 07/19/2018 0347 Gross per 24 hour  Intake 677.33 ml  Output 1650 ml  Net -972.67 ml   Filed Weights   07/16/18 1400 07/16/18 2115 07/17/18 0503  Weight: 76.6 kg 73.3 kg 73.5 kg    Examination:  General exam: No distress.  Mental status has much improved.  Still slightly confused.   Respiratory system: Bilateral decreased breath sounds at bases with scattered crackles.  Cardiovascular system: S1-S2 heard, rate controlled  gastrointestinal system: Abdomen is nondistended, soft and nontender. Normal bowel sounds heard. Extremities: No cyanosis or edema     Data Reviewed: I have personally reviewed following labs and imaging studies  CBC: Recent Labs  Lab 07/16/18 1416 07/16/18 1435 07/17/18 0408 07/18/18 0826 07/19/18 0245  WBC 13.1*  --  10.0 11.7* 9.8  NEUTROABS 10.5*  --   --  10.5* 8.4*  HGB 9.5* 9.9* 8.1* 8.2* 8.4*  HCT 32.3* 29.0* 27.0* 26.6* 27.3*  MCV 89.5  --  86.5 85.0 84.8  PLT 372  --  266 347 425   Basic Metabolic Panel: Recent Labs  Lab 07/16/18 1416 07/16/18 1435 07/17/18 0408 07/18/18 0826 07/19/18 0245  NA 138 137 138 140 143  K 5.6* 4.7 4.6 4.2 3.4*  CL 103  --  105 109 110  CO2 27  --  25 23 23   GLUCOSE 323*  --  199* 110* 67*  BUN 25*  --  21 26* 23  CREATININE 1.12  --  1.03 1.01 0.92  CALCIUM 9.0  --  8.2* 7.8* 7.9*  MG  --   --   --  1.9 2.0   GFR: Estimated Creatinine Clearance: 58.9 mL/min (by C-G formula based on SCr of 0.92 mg/dL). Liver Function Tests: Recent Labs  Lab 07/16/18 1416 07/17/18 0408 07/18/18 0826  AST 20 19 45*  ALT 41 34 55*  ALKPHOS 148* 129* 124  BILITOT 0.1* 0.3 0.5  PROT 7.9 6.6 6.5  ALBUMIN 2.0* 1.6* 1.6*   Recent Labs  Lab 07/16/18 1416  LIPASE 51   Recent Labs  Lab 07/19/18 0245  AMMONIA 15   Coagulation Profile: Recent Labs  Lab 07/16/18 1416  INR 1.2   Cardiac Enzymes:  Recent Labs  Lab 07/16/18 1416  TROPONINI <0.03   BNP (last 3 results) No results for input(s): PROBNP in the last 8760 hours. HbA1C: No results for input(s): HGBA1C in the last 72 hours. CBG: Recent Labs  Lab 07/17/18 2046 07/18/18 0848 07/18/18 1206 07/18/18 1713 07/18/18 2206  GLUCAP 126* 105* 200* 230* 113*   Lipid Profile: No results for input(s): CHOL, HDL, LDLCALC, TRIG, CHOLHDL, LDLDIRECT in the last 72 hours. Thyroid Function Tests: Recent Labs    07/19/18 0245  TSH 2.185   Anemia Panel: Recent Labs    07/19/18 0245  VITAMINB12 670  FOLATE 13.8   Sepsis Labs: Recent Labs  Lab 07/16/18 1416  LATICACIDVEN 1.4    Recent Results (from the past 240 hour(s))  Culture, blood (Routine x 2)     Status: None (Preliminary result)   Collection Time: 07/16/18  2:15 PM  Result Value Ref Range Status   Specimen Description BLOOD LEFT ANTECUBITAL  Final  Special Requests   Final    BOTTLES DRAWN AEROBIC AND ANAEROBIC Blood Culture results may not be optimal due to an inadequate volume of blood received in culture bottles   Culture  Setup Time   Final    GRAM POSITIVE COCCI IN BOTH AEROBIC AND ANAEROBIC BOTTLES CRITICAL VALUE NOTED.  VALUE IS CONSISTENT WITH PREVIOUSLY REPORTED AND CALLED VALUE. Performed at [facility], [place] [address] [place], [place] [address]    Culture [facility] POSITIVE COCCI  Final   Report Status PENDING  Incomplete  SARS Coronavirus 2 (CEPHEID- Performed in [place] hospital lab), Hosp Order     Status: None   Collection Time: 07/16/18  2:16 PM  Result Value Ref Range Status   SARS Coronavirus 2 NEGATIVE NEGATIVE Final    Comment: (NOTE) If result is NEGATIVE SARS-CoV-2 target nucleic acids are NOT DETECTED. The SARS-CoV-2 RNA is generally detectable in upper and lower  respiratory specimens during the acute phase of infection. The lowest  concentration of SARS-CoV-2 viral copies this assay can detect is 250  copies / mL. A  negative result does not preclude SARS-CoV-2 infection  and should not be used as the sole basis for treatment or other  patient management decisions.  A negative result may occur with  improper specimen collection / handling, submission of specimen other  than nasopharyngeal swab, presence of viral mutation(s) within the  areas targeted by this assay, and inadequate number of viral copies  (<250 copies / mL). A negative result must be combined with clinical  observations, patient history, and epidemiological information. If result is POSITIVE SARS-CoV-2 target nucleic acids are DETECTED. The SARS-CoV-2 RNA is generally detectable in upper and lower  respiratory specimens dur ing the acute phase of infection.  Positive  results are indicative of active infection with SARS-CoV-2.  Clinical  correlation with patient history and other diagnostic information is  necessary to determine patient infection status.  Positive results do  not rule out bacterial infection or co-infection with other viruses. If result is PRESUMPTIVE POSTIVE SARS-CoV-2 nucleic acids MAY BE PRESENT.   A presumptive positive result was obtained on the submitted specimen  and confirmed on repeat testing.  While 2019 novel coronavirus  (SARS-CoV-2) nucleic acids may be present in the submitted sample  additional confirmatory testing may be necessary for epidemiological  and / or clinical management purposes  to differentiate between  SARS-CoV-2 and other Sarbecovirus currently known to infect humans.  If clinically indicated additional testing with an alternate test  methodology [id number]) is advised. The SARS-CoV-2 RNA is generally  detectable in upper and lower respiratory sp ecimens during the acute  phase of infection. The expected result is Negative. Fact Sheet for Patients:  StrictlyIdeas.no Fact Sheet for Healthcare Providers: BankingDealers.co.za This test is not  yet approved or cleared by the [place] FDA and has been authorized for detection and/or diagnosis of SARS-CoV-2 by FDA under an Emergency Use Authorization (EUA).  This EUA will remain in effect (meaning this test can be used) for the duration of the COVID-19 declaration under Section 564(b)(1) of the Act, 21 U.S.C. section 360bbb-3(b)(1), unless the authorization is terminated or revoked sooner. Performed at [facility], [place] [address] [place], [place] [address]   Culture, blood (Routine x 2)     Status: Abnormal (Preliminary result)   Collection Time: 07/16/18  2:20 PM  Result Value Ref Range Status   Specimen Description BLOOD LEFT HAND  Final  Special Requests   Final    BOTTLES DRAWN AEROBIC AND ANAEROBIC Blood Culture results may not be optimal due to an inadequate volume of blood received in culture bottles   Culture  Setup Time   Final    GRAM POSITIVE COCCI ANAEROBIC BOTTLE ONLY CRITICAL RESULT CALLED TO, READ BACK BY AND VERIFIED WITH: PHARM D ERIN  D 52 [id number] FCP    Culture (A)  Final    STAPHYLOCOCCUS EPIDERMIDIS SUSCEPTIBILITIES TO FOLLOW Performed at [facility], [place] [address], [place], [place] [address]    Report Status PENDING  Incomplete  Blood Culture ID Panel (Reflexed)     Status: Abnormal   Collection Time: 07/16/18  2:20 PM  Result Value Ref Range Status   Enterococcus species NOT DETECTED NOT DETECTED Final   Listeria monocytogenes NOT DETECTED NOT DETECTED Final   Staphylococcus species DETECTED (A) NOT DETECTED Final    Comment: Methicillin (oxacillin) resistant coagulase negative staphylococcus. Possible blood culture contaminant (unless isolated from more than one blood culture draw or clinical case suggests pathogenicity). No antibiotic treatment is indicated for blood  culture contaminants. CRITICAL RESULT CALLED TO, READ BACK BY AND VERIFIED WITH: PHARM D ERIN  D 1328 [id number] FCP    Staphylococcus aureus (BCID) NOT DETECTED  NOT DETECTED Final   Methicillin resistance DETECTED (A) NOT DETECTED Final    Comment: CRITICAL RESULT CALLED TO, READ BACK BY AND VERIFIED WITH: PHARM D ERIN  D 1328 [id number] FCP    Streptococcus species NOT DETECTED NOT DETECTED Final   Streptococcus agalactiae NOT DETECTED NOT DETECTED Final   Streptococcus pneumoniae NOT DETECTED NOT DETECTED Final   Streptococcus pyogenes NOT DETECTED NOT DETECTED Final   Acinetobacter baumannii NOT DETECTED NOT DETECTED Final   Enterobacteriaceae species NOT DETECTED NOT DETECTED Final   Enterobacter cloacae complex NOT DETECTED NOT DETECTED Final   Escherichia coli NOT DETECTED NOT DETECTED Final   Klebsiella oxytoca NOT DETECTED NOT DETECTED Final   Klebsiella pneumoniae NOT DETECTED NOT DETECTED Final   Proteus species NOT DETECTED NOT DETECTED Final   Serratia marcescens NOT DETECTED NOT DETECTED Final   Haemophilus influenzae NOT DETECTED NOT DETECTED Final   Neisseria meningitidis NOT DETECTED NOT DETECTED Final   Pseudomonas aeruginosa NOT DETECTED NOT DETECTED Final   Candida albicans NOT DETECTED NOT DETECTED Final   Candida glabrata NOT DETECTED NOT DETECTED Final   Candida krusei NOT DETECTED NOT DETECTED Final   Candida parapsilosis NOT DETECTED NOT DETECTED Final   Candida tropicalis NOT DETECTED NOT DETECTED Final    Comment: Performed at [facility], [address] [place], [place] [address]  Urine culture     Status: None   Collection Time: 07/16/18  4:17 PM  Result Value Ref Range Status   Specimen Description URINE, RANDOM  Final   Special Requests NONE  Final   Culture   Final    NO GROWTH Performed at [facility], [place] [address], [place], [place] [address]    Report Status 07/17/2018 FINAL  Final  MRSA PCR Screening     Status: None   Collection Time: 07/16/18 11:44 PM  Result Value Ref Range Status   MRSA by PCR NEGATIVE NEGATIVE Final    Comment:        The GeneXpert MRSA Assay (FDA approved for  NASAL specimens only), is one component of a comprehensive MRSA colonization surveillance program. It is not intended to diagnose MRSA infection nor to guide or  monitor treatment for MRSA infections. Performed at [facility], [place] [address] [place], [place] [address]          Radiology Studies: No results found.      Scheduled Meds: . docusate sodium  100 mg Oral BID  . enoxaparin  40 mg Subcutaneous Q24H  . feeding supplement (ENSURE ENLIVE)  237 mL Oral TID BM  . guaiFENesin  5 mL Oral Q6H  . hydrALAZINE  10 mg Oral Q6H  . insulin aspart  0-15 Units Subcutaneous TID WC  . insulin aspart  0-5 Units Subcutaneous QHS  . insulin glargine  5 Units Subcutaneous QHS  . ipratropium-albuterol  3 mL Nebulization TID  . latanoprost  1 drop Both Eyes QHS  . lidocaine  1 patch Transdermal Daily  . magic mouthwash  5 mL Oral Q6H  . methylPREDNISolone (SOLU-MEDROL) injection  40 mg Intravenous Q8H  . metoCLOPramide  5 mg Oral Q6H  . metoprolol tartrate  12.5 mg Oral BID  . multivitamin with minerals  1 tablet Oral Daily  . pantoprazole  20 mg Oral BID  . timolol  1 drop Both Eyes Daily  . umeclidinium-vilanterol  1 puff Inhalation Daily   Continuous Infusions: . sodium chloride 50 mL/hr at 07/18/18 2011  . vancomycin 1,250 mg (07/18/18 1354)     LOS: 3 days        Aline August, MD [facility] 07/19/2018, 7:50 AM

## 2018-07-19 NOTE — Progress Notes (Signed)
Pt in no distress requiring bipap at this time.  RT will continue to monitor. °

## 2018-07-20 ENCOUNTER — Inpatient Hospital Stay: Payer: Self-pay

## 2018-07-20 ENCOUNTER — Inpatient Hospital Stay (HOSPITAL_COMMUNITY): Payer: Medicare Other

## 2018-07-20 LAB — BASIC METABOLIC PANEL
Anion gap: 5 (ref 5–15)
BUN: 18 mg/dL (ref 8–23)
CO2: 29 mmol/L (ref 22–32)
Calcium: 7.8 mg/dL — ABNORMAL LOW (ref 8.9–10.3)
Chloride: 111 mmol/L (ref 98–111)
Creatinine, Ser: 0.87 mg/dL (ref 0.61–1.24)
GFR calc Af Amer: >60 mL/min (ref >60)
GFR calc non Af Amer: >60 mL/min (ref >60)
Glucose, Bld: 148 mg/dL — ABNORMAL HIGH (ref 70–99)
Potassium: 4.4 mmol/L (ref 3.5–5.1)
Sodium: 145 mmol/L (ref 135–145)

## 2018-07-20 LAB — CBC WITH DIFFERENTIAL/PLATELET
Abs Immature Granulocytes: 0.09 10*3/uL — ABNORMAL HIGH (ref 0.00–0.07)
Basophils Absolute: 0 10*3/uL (ref 0.0–0.1)
Basophils Relative: 0 %
Eosinophils Absolute: 0 10*3/uL (ref 0.0–0.5)
Eosinophils Relative: 0 %
HCT: 31.6 % — ABNORMAL LOW (ref 39.0–52.0)
Hemoglobin: 9.6 g/dL — ABNORMAL LOW (ref 13.0–17.0)
Immature Granulocytes: 1 %
Lymphocytes Relative: 14 %
Lymphs Abs: 1.3 10*3/uL (ref 0.7–4.0)
MCH: 26.3 pg (ref 26.0–34.0)
MCHC: 30.4 g/dL (ref 30.0–36.0)
MCV: 86.6 fL (ref 80.0–100.0)
Monocytes Absolute: 0.8 10*3/uL (ref 0.1–1.0)
Monocytes Relative: 8 %
Neutro Abs: 7 10*3/uL (ref 1.7–7.7)
Neutrophils Relative %: 77 %
Platelets: 437 10*3/uL — ABNORMAL HIGH (ref 150–400)
RBC: 3.65 MIL/uL — ABNORMAL LOW (ref 4.22–5.81)
RDW: 14.8 % (ref 11.5–15.5)
WBC: 9.2 10*3/uL (ref 4.0–10.5)
nRBC: 0.3 % — ABNORMAL HIGH (ref 0.0–0.2)

## 2018-07-20 LAB — GLUCOSE, CAPILLARY
Glucose-Capillary: 135 mg/dL — ABNORMAL HIGH (ref 70–99)
Glucose-Capillary: 188 mg/dL — ABNORMAL HIGH (ref 70–99)
Glucose-Capillary: 123 mg/dL — ABNORMAL HIGH (ref 70–99)
Glucose-Capillary: 116 mg/dL — ABNORMAL HIGH (ref 70–99)
Glucose-Capillary: 307 mg/dL — ABNORMAL HIGH (ref 70–99)

## 2018-07-20 LAB — MAGNESIUM: Magnesium: 2.1 mg/dL (ref 1.7–2.4)

## 2018-07-20 MED ORDER — CHLORHEXIDINE GLUCONATE CLOTH 2 % EX PADS: 6.0000 | MEDICATED_PAD | Freq: Every day | CUTANEOUS | Status: DC

## 2018-07-20 MED ORDER — PREDNISONE 20 MG PO TABS
40.0000 mg | ORAL_TABLET | Freq: Every day | ORAL | Status: DC
  Administered 2018-07-21: 40 mg via ORAL
  Filled 2018-07-20: qty 2

## 2018-07-20 MED ORDER — IPRATROPIUM-ALBUTEROL 0.5-2.5 (3) MG/3ML IN SOLN
3.0000 mL | Freq: Two times a day (BID) | RESPIRATORY_TRACT | Status: DC
  Administered 2018-07-20 – 2018-07-21 (×2): 3 mL via RESPIRATORY_TRACT
  Filled 2018-07-20 (×2): qty 3

## 2018-07-20 MED ORDER — TRAVASOL 10 % IV SOLN
INTRAVENOUS | Status: DC
  Filled 2018-07-20: qty 499.2

## 2018-07-20 MED ORDER — TRAVASOL 10 % IV SOLN
INTRAVENOUS | Status: AC
  Administered 2018-07-20: 18:00:00 via INTRAVENOUS
  Filled 2018-07-20: qty 499.2

## 2018-07-20 MED ORDER — SODIUM CHLORIDE 0.9% FLUSH: 10.0000 mL | INTRAVENOUS | Status: DC | PRN

## 2018-07-20 NOTE — Progress Notes (Signed)
[place] NOTE   Pharmacy Consult for TPN Indication: hx of gastrectomy  Patient Measurements: Height: 5\' 7"  (170.2 cm) Weight: 162 lb 0.6 oz (73.5 kg) IBW/kg (Calculated) : 66.1 TPN AdjBW (KG): 73.3 Body mass index is 25.38 kg/m.   Assessment: 84 yo m presenting on 5/21 with fever and SOB, r/o for COVID. Initial blood cx positive for staph epi and PICC line was removed. Repeat cultures were neg and abx discontinued on 5/25; now treating as COPD exacerbation  PMH COPD chronic DVT DM CKD HTN Hx of stomach cancer with distal gastrectomy on home TPN  GI: chronic TPN as outpatient  Al 1.6 Endo: 100 - 188s  Insulin requirements in the past 24 hours: 3 Lytes: K 4.4, Mg 2.1 Renal: SCr 0.87 Pulm:  Cards:  HTN Hepatobil: WNL Tbili 0.5 Neuro: no issues ID: s/p abx  TPN Access: pending PICC TPN start date: PTA  Nutritional Goals (per RD recommendation on 5/22): KCal: 1800 - 2000 Protein: 90 - 105 Fluid: 1.8 - 2 L  Home TPN: 70 mL/hr with 78 g protein/day, 312 g dextrose, and 50 g lipids on MWF. Total cal w lipids is 1872 / day  Based on RD recommendations patient is not meeting nutritional goals with Home TPN - goal TPN should be 80 mL/hr with 100 g protein, 52 g lipid (MWF), 307 g dextrose for total cal 1962 / day (patient only seems to be drinking 1 - 2 ensure / day - if increases then take that into account)  Current Nutrition: Full liquid diet, TPN  Plan:  Resume TPN 40 mL/hr (1/2 rate since been off a few days)  Lipids MWF  This TPN provides 50 g of protein, 153 g of dextrose, and 26 g of lipids which provides 981 kCals per day, meeting 50 % of patient needs  Electrolytes in TPN: Standard Add MVI, trace elements MWF Continue QID SSI  Monitor TPN labs  Levester Fresh, PharmD, BCPS, BCCCP Clinical Pharmacist [phone]  Please check AMION for all [place] numbers  07/20/2018 8:52 AM

## 2018-07-20 NOTE — Progress Notes (Addendum)
  Speech Language Pathology Treatment: Dysphagia  Patient Details Name: Timothy Garcia MRN: [id number] DOB: [birth year] Today's Date: 07/20/2018 Time: 5997-7414 SLP Time Calculation (min) (ACUTE ONLY): 18 min  Assessment / Plan / Recommendation Clinical Impression  Pt declined to consume anything beyond just a single bolus.  He stated he was "full" and would "eat more later".  No clinical indication of aspiration with intake of single bolus grape juice.  Intake listed as 50% and pt appears to have eaten 50% of his lunch.    He initially admitted to issues with regurgitation this weekend but then later denied this occurrence.  SLP questioned if pt was on a full liquid diet due to his regurgitation after distal gastrectomy to which he said "I know how much I can have."  Pt politely declined 3 ounce water test as he stated he was "too full" and SLP agrees if concerned re: potential regurgitation.    Pt reports he has been feeding himself and states he is here in the hospital because of "back pain". SLP advised him to admission for sepsis.   Called RN to clarify but she was at lunch and her "buddy" did not pick up call therefore asked for return call or page to assure tolerance and SLP will sign off if appropriate.  Anticipate pt will need to stay on full liquids and follow up with his surgeon in future for potential dietary advancement.  Thanks!     RN called SLP back and reports pt tolerating po diet.  Note plans for new PICC and TPN to continue. Will sign off.  Thanks.  Timothy Garcia    HPI HPI: Pt is an 84 yo male adm to [facility] from [facility] with shortness of breath concern for sepsis.  Pt also treated for pna and oral candidiasis with Augmentin, Protonix, Nystatin, and was on Reglan.  He does have h/o concerns for aspiration pneumonias.  Pt PMH + for distal stomach gastrectomy 04/2018 *pt denies this was cancerous, COPD, DM, CRD, CKD III, HTN.  Pt imaging studies showed severe emphysematous, small bilateral  effusions with ATX.  CXR today 5/22 showed likely ATX/retrograde opacity.   He had a fever of 100.7 with WBC increase to 13.1 down to 10 today - RN reports MD is concerned that PICC may be site of infection and plans are to dc it.  Pt has been on a full liquid diet since his gastrectomy per his report.        SLP Plan  Continue with current plan of care(will sign off after talk to RN)       Recommendations  Diet recommendations: Other(comment);Thin liquid;Nectar-thick liquid(full liquids) Medication Administration: Crushed with puree(per pt) Supervision: Patient able to self feed Compensations: Slow rate;Small sips/bites Postural Changes and/or Swallow Maneuvers: Seated upright 90 degrees;Upright 30-60 min after meal                Oral Care Recommendations: Oral care BID SLP Visit Diagnosis: Dysphagia, unspecified (R13.10) Plan: Continue with current plan of care(will sign off after talk to RN)       Page, [facility] SLP [place] Pager [phone] Office [phone]   Timothy Garcia 07/20/2018, 1:24 PM

## 2018-07-20 NOTE — TOC Progression Note (Addendum)
Transition of Care [facility]) - Progression Note    Patient Details  Name: Timothy Garcia MRN: [id number] Date of Birth: 1934-10-11  Transition of Care [facility]) CM/SW [place], LCSW Phone Number: 07/20/2018, 11:28 AM  Clinical Narrative:    CSW updated [facility] and [place] (for TPN) of tentative discharge plan for tomorrow. Patient's sister going to contact CSW back with an email address for [facility] to send admission paperwork.    Expected Discharge Plan: [place] Barriers to Discharge: Continued Medical Work up  Expected Discharge Plan and Services Expected Discharge Plan: [place] In-house Referral: Clinical Social Work   Post Acute Care Choice: [place] Living arrangements for the past 2 months: [place]                 DME Arranged: N/A DME Agency: NA       HH Arranged: NA HH Agency: NA         Social Determinants of Health (SDOH) Interventions    Readmission Risk Interventions No flowsheet data found.

## 2018-07-20 NOTE — Progress Notes (Addendum)
Patient ID: Timothy Garcia, male   DOB: [birth year], 84 y.o.   MRN: [id number]  PROGRESS NOTE    Timothy Garcia  XLK:[id number] DOB: [birth year] DOA: 07/16/2018 PCP: Rogers Blocker, MD   Brief Narrative:  84 year old male with history of COPD, chronic DVTs, diabetes, chronic renal disease, hypertension, stomach cancer with distal gastrectomy, chronic kidney disease stage III presented on 07/16/2018 from skilled nursing facility with shortness of breath and altered mental status.  He was initially hypoxic and needed nonrebreather.  COVID-19 testing was negative.  CT chest showed small bilateral pleural effusions with adjacent atelectasis but no PE and no obvious infiltrates.  Patient was started on broad-spectrum antibiotics.  Assessment & Plan:   Principal Problem:   Sepsis ([place]) Active Problems:   Hyperglycemia   Benign essential HTN   AMS (altered mental status)   COPD with acute exacerbation (HCC)   CKD (chronic kidney disease) stage 3, GFR 30-59 ml/min (HCC)   NSVT (nonsustained ventricular tachycardia) (HCC)  Sepsis: Present on admission -CT of the chest did not show obvious infiltrate. -Presented with fever and leukocytosis with altered mental status. -Started on vancomycin and Zosyn.  Blood cultures growing staph epidermidis.  Unclear if this is a contaminant versus a real pathogen as patient presented with an indwelling PICC line.  PICC line was discontinued on 07/17/2018.  Repeat cultures from 07/18/2018 is negative so far.  Currently on vancomycin.  Zosyn discontinued on 07/18/2018.  Repeat cultures from 07/18/2018 has remained negative so far.  Will discontinue vancomycin.  Acute hypoxic respiratory failure -May be secondary to COPD exacerbation -Patient was on nonrebreather on presentation.  Respiratory status improving. -Currently on 2 L oxygen via nasal cannula.  Wean off as able. -Repeat chest x-ray today.  COPD with exacerbation -Respiratory status improving.  Switch Solu-Medrol to 40  mg prednisone daily..  Continue nebs and Anoro Ellipta -Outpatient follow-up with pulmonary  Acute metabolic encephalopathy -Probably from above. -Patient was extremely confused on presentation.  Mental status has much improved but is still slightly confused.  Monitor mental status. -Fall precautions. -PT/OT/SLP evaluation appreciated.  Continue full liquid diet as per SLP recommendations. -Vitamin B12, folate, TSH levels normal. -We will hold off on imaging of the brain for now.  Diabetes mellitus type 2 -Monitor CBGs with SSI.  Chronic kidney disease stage III -Creatinine stable.  Outpatient follow-up  History of gastric cancer status post partial gastrectomy currently on chronic TPN as an outpatient -TPN on hold currently because of bacteremia.  Blood cultures from 07/18/2018 negative so far. -We will request for PICC line placement.  Restart TPN -Continue full liquid diet as per SLP recommendations.   -Outpatient follow-up with oncology and general surgery.  Generalized debility -PT/OT. -Palliative care consult for goals of care appreciated.  Currently still full code.   DVT prophylaxis: Lovenox, will switch to prophylactic dose Code Status: Full Family Communication: Spoke to Sister [name] on phone on 07/17/2018 Disposition Plan: Back to SNF tomorrow if remains clinically stable   Consultants: Palliative care  Procedures: None  Antimicrobials: Vancomycin from 07/16/2018 onwards Zosyn from 07/16/2018-07/18/2018   Subjective: Patient seen and examined at bedside. Hard of hearing, answers some questions.  Still has some minimal confusion.  No overnight fever or vomiting reported. Objective: Vitals:   07/19/18 1348 07/19/18 1745 07/19/18 2018 07/20/18 0703  BP:  119/83  (!) 141/123  Pulse:    89  Resp:    16  Temp:    (!) 97.5 F (36.4 C)  TempSrc:    Oral  SpO2: 93%  91% 100%  Weight:      Height:        Intake/Output Summary (Last 24 hours) at 07/20/2018  0759 Last data filed at 07/20/2018 0705 Gross per 24 hour  Intake 420 ml  Output 1250 ml  Net -830 ml   Filed Weights   07/16/18 1400 07/16/18 2115 07/17/18 0503  Weight: 76.6 kg 73.3 kg 73.5 kg    Examination:  General exam: No acute distress.  Elderly male lying in bed.  Mental status has much improved.  Still has some confusion.   Respiratory system: Bilateral decreased breath sounds at bases with scattered crackles.  No wheezing Cardiovascular system: Rate controlled, S1-S2 heard gastrointestinal system: Abdomen is nondistended, soft and nontender. Normal bowel sounds heard. Extremities: No cyanosis or edema     Data Reviewed: I have personally reviewed following labs and imaging studies  CBC: Recent Labs  Lab 07/16/18 1416 07/16/18 1435 07/17/18 0408 07/18/18 0826 07/19/18 0245 07/20/18 0259  WBC 13.1*  --  10.0 11.7* 9.8 9.2  NEUTROABS 10.5*  --   --  10.5* 8.4* 7.0  HGB 9.5* 9.9* 8.1* 8.2* 8.4* 9.6*  HCT 32.3* 29.0* 27.0* 26.6* 27.3* 31.6*  MCV 89.5  --  86.5 85.0 84.8 86.6  PLT 372  --  266 347 385 720*   Basic Metabolic Panel: Recent Labs  Lab 07/16/18 1416 07/16/18 1435 07/17/18 0408 07/18/18 0826 07/19/18 0245 07/20/18 0259  NA 138 137 138 140 143 145  K 5.6* 4.7 4.6 4.2 3.4* 4.4  CL 103  --  105 109 110 111  CO2 27  --  25 23 23 29   GLUCOSE 323*  --  199* 110* 67* 148*  BUN 25*  --  21 26* 23 18  CREATININE 1.12  --  1.03 1.01 0.92 0.87  CALCIUM 9.0  --  8.2* 7.8* 7.9* 7.8*  MG  --   --   --  1.9 2.0 2.1   GFR: Estimated Creatinine Clearance: 62.3 mL/min (by C-G formula based on SCr of 0.87 mg/dL). Liver Function Tests: Recent Labs  Lab 07/16/18 1416 07/17/18 0408 07/18/18 0826  AST 20 19 45*  ALT 41 34 55*  ALKPHOS 148* 129* 124  BILITOT 0.1* 0.3 0.5  PROT 7.9 6.6 6.5  ALBUMIN 2.0* 1.6* 1.6*   Recent Labs  Lab 07/16/18 1416  LIPASE 51   Recent Labs  Lab 07/19/18 0245  AMMONIA 15   Coagulation Profile: Recent Labs  Lab  07/16/18 1416  INR 1.2   Cardiac Enzymes: Recent Labs  Lab 07/16/18 1416  TROPONINI <0.03   BNP (last 3 results) No results for input(s): PROBNP in the last 8760 hours. HbA1C: No results for input(s): HGBA1C in the last 72 hours. CBG: Recent Labs  Lab 07/19/18 1353 07/19/18 1740 07/19/18 2125 07/19/18 2332 07/20/18 0254  GLUCAP 111* 71 52* 125* 135*   Lipid Profile: No results for input(s): CHOL, HDL, LDLCALC, TRIG, CHOLHDL, LDLDIRECT in the last 72 hours. Thyroid Function Tests: Recent Labs    07/19/18 0245  TSH 2.185   Anemia Panel: Recent Labs    07/19/18 0245  VITAMINB12 670  FOLATE 13.8   Sepsis Labs: Recent Labs  Lab 07/16/18 1416  LATICACIDVEN 1.4    Recent Results (from the past 240 hour(s))  Culture, blood (Routine x 2)     Status: Abnormal   Collection Time: 07/16/18  2:15 PM  Result Value Ref Range  Status   Specimen Description BLOOD LEFT ANTECUBITAL  Final   Special Requests   Final    BOTTLES DRAWN AEROBIC AND ANAEROBIC Blood Culture results may not be optimal due to an inadequate volume of blood received in culture bottles   Culture  Setup Time   Final    GRAM POSITIVE COCCI IN BOTH AEROBIC AND ANAEROBIC BOTTLES CRITICAL VALUE NOTED.  VALUE IS CONSISTENT WITH PREVIOUSLY REPORTED AND CALLED VALUE.    Culture (A)  Final    STAPHYLOCOCCUS EPIDERMIDIS SUSCEPTIBILITIES PERFORMED ON PREVIOUS CULTURE WITHIN THE LAST 5 DAYS. Performed at [facility], [place] [address], [place], [place] [address]    Report Status 07/19/2018 FINAL  Final  SARS Coronavirus 2 (CEPHEID- Performed in [place] hospital lab), Hosp Order     Status: None   Collection Time: 07/16/18  2:16 PM  Result Value Ref Range Status   SARS Coronavirus 2 NEGATIVE NEGATIVE Final    Comment: (NOTE) If result is NEGATIVE SARS-CoV-2 target nucleic acids are NOT DETECTED. The SARS-CoV-2 RNA is generally detectable in upper and lower  respiratory specimens during the acute  phase of infection. The lowest  concentration of SARS-CoV-2 viral copies this assay can detect is 250  copies / mL. A negative result does not preclude SARS-CoV-2 infection  and should not be used as the sole basis for treatment or other  patient management decisions.  A negative result may occur with  improper specimen collection / handling, submission of specimen other  than nasopharyngeal swab, presence of viral mutation(s) within the  areas targeted by this assay, and inadequate number of viral copies  (<250 copies / mL). A negative result must be combined with clinical  observations, patient history, and epidemiological information. If result is POSITIVE SARS-CoV-2 target nucleic acids are DETECTED. The SARS-CoV-2 RNA is generally detectable in upper and lower  respiratory specimens dur ing the acute phase of infection.  Positive  results are indicative of active infection with SARS-CoV-2.  Clinical  correlation with patient history and other diagnostic information is  necessary to determine patient infection status.  Positive results do  not rule out bacterial infection or co-infection with other viruses. If result is PRESUMPTIVE POSTIVE SARS-CoV-2 nucleic acids MAY BE PRESENT.   A presumptive positive result was obtained on the submitted specimen  and confirmed on repeat testing.  While 2019 novel coronavirus  (SARS-CoV-2) nucleic acids may be present in the submitted sample  additional confirmatory testing may be necessary for epidemiological  and / or clinical management purposes  to differentiate between  SARS-CoV-2 and other Sarbecovirus currently known to infect humans.  If clinically indicated additional testing with an alternate test  methodology [phone]) is advised. The SARS-CoV-2 RNA is generally  detectable in upper and lower respiratory sp ecimens during the acute  phase of infection. The expected result is Negative. Fact Sheet for Patients:   StrictlyIdeas.no Fact Sheet for Healthcare Providers: BankingDealers.co.za This test is not yet approved or cleared by the [place] FDA and has been authorized for detection and/or diagnosis of SARS-CoV-2 by FDA under an Emergency Use Authorization (EUA).  This EUA will remain in effect (meaning this test can be used) for the duration of the COVID-19 declaration under Section 564(b)(1) of the Act, 21 U.S.C. section 360bbb-3(b)(1), unless the authorization is terminated or revoked sooner. Performed at [facility], [place] [address] [place], [place] [address]   Culture, blood (Routine x 2)     Status: Abnormal  Collection Time: 07/16/18  2:20 PM  Result Value Ref Range Status   Specimen Description BLOOD LEFT HAND  Final   Special Requests   Final    BOTTLES DRAWN AEROBIC AND ANAEROBIC Blood Culture results may not be optimal due to an inadequate volume of blood received in culture bottles   Culture  Setup Time   Final    GRAM POSITIVE COCCI ANAEROBIC BOTTLE ONLY CRITICAL RESULT CALLED TO, READ BACK BY AND VERIFIED WITH: Weldon Picking [id number] FCP Performed at [facility], [place] [address] [place], [place] [address]    Culture STAPHYLOCOCCUS EPIDERMIDIS (A)  Final   Report Status 07/19/2018 FINAL  Final   Organism ID, Bacteria STAPHYLOCOCCUS EPIDERMIDIS  Final      Susceptibility   Staphylococcus epidermidis - MIC*    CIPROFLOXACIN >=8 RESISTANT Resistant     ERYTHROMYCIN >=8 RESISTANT Resistant     GENTAMICIN <=0.5 SENSITIVE Sensitive     OXACILLIN >=4 RESISTANT Resistant     TETRACYCLINE 2 SENSITIVE Sensitive     VANCOMYCIN 1 SENSITIVE Sensitive     TRIMETH/SULFA 80 RESISTANT Resistant     CLINDAMYCIN >=8 RESISTANT Resistant     RIFAMPIN <=0.5 SENSITIVE Sensitive     Inducible Clindamycin NEGATIVE Sensitive     * STAPHYLOCOCCUS EPIDERMIDIS  Blood Culture ID Panel (Reflexed)     Status: Abnormal   Collection  Time: 07/16/18  2:20 PM  Result Value Ref Range Status   Enterococcus species NOT DETECTED NOT DETECTED Final   Listeria monocytogenes NOT DETECTED NOT DETECTED Final   Staphylococcus species DETECTED (A) NOT DETECTED Final    Comment: Methicillin (oxacillin) resistant coagulase negative staphylococcus. Possible blood culture contaminant (unless isolated from more than one blood culture draw or clinical case suggests pathogenicity). No antibiotic treatment is indicated for blood  culture contaminants. CRITICAL RESULT CALLED TO, READ BACK BY AND VERIFIED WITH: PHARM D ERIN  D 1328 [id number] FCP    Staphylococcus aureus (BCID) NOT DETECTED NOT DETECTED Final   Methicillin resistance DETECTED (A) NOT DETECTED Final    Comment: CRITICAL RESULT CALLED TO, READ BACK BY AND VERIFIED WITH: PHARM D ERIN  D 1328 [id number] FCP    Streptococcus species NOT DETECTED NOT DETECTED Final   Streptococcus agalactiae NOT DETECTED NOT DETECTED Final   Streptococcus pneumoniae NOT DETECTED NOT DETECTED Final   Streptococcus pyogenes NOT DETECTED NOT DETECTED Final   Acinetobacter baumannii NOT DETECTED NOT DETECTED Final   Enterobacteriaceae species NOT DETECTED NOT DETECTED Final   Enterobacter cloacae complex NOT DETECTED NOT DETECTED Final   Escherichia coli NOT DETECTED NOT DETECTED Final   Klebsiella oxytoca NOT DETECTED NOT DETECTED Final   Klebsiella pneumoniae NOT DETECTED NOT DETECTED Final   Proteus species NOT DETECTED NOT DETECTED Final   Serratia marcescens NOT DETECTED NOT DETECTED Final   Haemophilus influenzae NOT DETECTED NOT DETECTED Final   Neisseria meningitidis NOT DETECTED NOT DETECTED Final   Pseudomonas aeruginosa NOT DETECTED NOT DETECTED Final   Candida albicans NOT DETECTED NOT DETECTED Final   Candida glabrata NOT DETECTED NOT DETECTED Final   Candida krusei NOT DETECTED NOT DETECTED Final   Candida parapsilosis NOT DETECTED NOT DETECTED Final   Candida tropicalis NOT DETECTED NOT  DETECTED Final    Comment: Performed at [facility], [address] [place], [place] [address]  Urine culture     Status: None   Collection Time: 07/16/18  4:17 PM  Result Value Ref  Range Status   Specimen Description URINE, RANDOM  Final   Special Requests NONE  Final   Culture   Final    NO GROWTH Performed at [facility], [address], [place], [place] [address]    Report Status 07/17/2018 FINAL  Final  MRSA PCR Screening     Status: None   Collection Time: 07/16/18 11:44 PM  Result Value Ref Range Status   MRSA by PCR NEGATIVE NEGATIVE Final    Comment:        The GeneXpert MRSA Assay (FDA approved for NASAL specimens only), is one component of a comprehensive MRSA colonization surveillance program. It is not intended to diagnose MRSA infection nor to guide or monitor treatment for MRSA infections. Performed at [facility], [place] [address], [place], [place] [address]   Culture, blood (routine x 2)     Status: None (Preliminary result)   Collection Time: 07/18/18  5:43 AM  Result Value Ref Range Status   Specimen Description BLOOD LEFT ARM  Final   Special Requests   Final    BOTTLES DRAWN AEROBIC ONLY Blood Culture adequate volume   Culture   Final    NO GROWTH 2 DAYS Performed at [facility], [place] [address], [place], [place] [address]    Report Status PENDING  Incomplete  Culture, blood (routine x 2)     Status: None (Preliminary result)   Collection Time: 07/18/18  5:49 AM  Result Value Ref Range Status   Specimen Description BLOOD LEFT HAND  Final   Special Requests   Final    BOTTLES DRAWN AEROBIC ONLY Blood Culture adequate volume   Culture   Final    NO GROWTH 2 DAYS Performed at [facility], [place] [address], [place], [place] [address]    Report Status PENDING  Incomplete         Radiology Studies: No results found.      Scheduled Meds: . docusate sodium  100 mg Oral BID  . enoxaparin  40 mg Subcutaneous  Q24H  . feeding supplement (ENSURE ENLIVE)  237 mL Oral TID BM  . guaiFENesin  5 mL Oral Q6H  . hydrALAZINE  10 mg Oral Q6H  . insulin aspart  0-15 Units Subcutaneous TID WC  . insulin aspart  0-5 Units Subcutaneous QHS  . ipratropium-albuterol  3 mL Nebulization TID  . latanoprost  1 drop Both Eyes QHS  . lidocaine  1 patch Transdermal Daily  . magic mouthwash  5 mL Oral Q6H  . methylPREDNISolone (SOLU-MEDROL) injection  40 mg Intravenous Q12H  . metoCLOPramide  5 mg Oral Q6H  . metoprolol tartrate  12.5 mg Oral BID  . multivitamin with minerals  1 tablet Oral Daily  . pantoprazole  20 mg Oral BID  . timolol  1 drop Both Eyes Daily  . umeclidinium-vilanterol  1 puff Inhalation Daily   Continuous Infusions: . vancomycin 1,250 mg (07/19/18 1752)     LOS: 4 days        Aline August, MD [facility] 07/20/2018, 7:59 AM

## 2018-07-20 NOTE — Progress Notes (Signed)
Peripherally Inserted Central Catheter/Midline Placement  The IV Nurse has discussed with the patient and/or persons authorized to consent for the patient, the purpose of this procedure and the potential benefits and risks involved with this procedure.  The benefits include less needle sticks, lab draws from the catheter, and the patient may be discharged home with the catheter. Risks include, but not limited to, infection, bleeding, blood clot (thrombus formation), and puncture of an artery; nerve damage and irregular heartbeat and possibility to perform a PICC exchange if needed/ordered by physician.  Alternatives to this procedure were also discussed.  Bard Power PICC patient education guide, fact sheet on infection prevention and patient information card has been provided to patient /or left at bedside.    PICC/Midline Placement Documentation        Timothy Garcia 07/20/2018, 4:13 PM

## 2018-07-20 NOTE — Progress Notes (Signed)
Pt is not needing BIPAP at this time.

## 2018-07-21 LAB — CBC WITH DIFFERENTIAL/PLATELET
Abs Immature Granulocytes: 0.06 10*3/uL (ref 0.00–0.07)
Basophils Absolute: 0 10*3/uL (ref 0.0–0.1)
Basophils Relative: 0 %
Eosinophils Absolute: 0 10*3/uL (ref 0.0–0.5)
Eosinophils Relative: 1 %
HCT: 29.4 % — ABNORMAL LOW (ref 39.0–52.0)
Hemoglobin: 8.8 g/dL — ABNORMAL LOW (ref 13.0–17.0)
Immature Granulocytes: 1 %
Lymphocytes Relative: 14 %
Lymphs Abs: 1.1 10*3/uL (ref 0.7–4.0)
MCH: 26.4 pg (ref 26.0–34.0)
MCHC: 29.9 g/dL — ABNORMAL LOW (ref 30.0–36.0)
MCV: 88.3 fL (ref 80.0–100.0)
Monocytes Absolute: 0.7 10*3/uL (ref 0.1–1.0)
Monocytes Relative: 9 %
Neutro Abs: 5.8 10*3/uL (ref 1.7–7.7)
Neutrophils Relative %: 75 %
Platelets: 397 10*3/uL (ref 150–400)
RBC: 3.33 MIL/uL — ABNORMAL LOW (ref 4.22–5.81)
RDW: 15 % (ref 11.5–15.5)
WBC: 7.7 10*3/uL (ref 4.0–10.5)
nRBC: 0.4 % — ABNORMAL HIGH (ref 0.0–0.2)

## 2018-07-21 LAB — PREALBUMIN: Prealbumin: 16 mg/dL — ABNORMAL LOW (ref 18–38)

## 2018-07-21 LAB — PHOSPHORUS: Phosphorus: 2.2 mg/dL — ABNORMAL LOW (ref 2.5–4.6)

## 2018-07-21 LAB — COMPREHENSIVE METABOLIC PANEL
ALT: 45 U/L — ABNORMAL HIGH (ref 0–44)
AST: 18 U/L (ref 15–41)
Albumin: 1.6 g/dL — ABNORMAL LOW (ref 3.5–5.0)
Alkaline Phosphatase: 101 U/L (ref 38–126)
Anion gap: 4 — ABNORMAL LOW (ref 5–15)
BUN: 20 mg/dL (ref 8–23)
CO2: 26 mmol/L (ref 22–32)
Calcium: 7.6 mg/dL — ABNORMAL LOW (ref 8.9–10.3)
Chloride: 111 mmol/L (ref 98–111)
Creatinine, Ser: 0.82 mg/dL (ref 0.61–1.24)
GFR calc Af Amer: >60 mL/min (ref >60)
GFR calc non Af Amer: >60 mL/min (ref >60)
Glucose, Bld: 362 mg/dL — ABNORMAL HIGH (ref 70–99)
Potassium: 4.2 mmol/L (ref 3.5–5.1)
Sodium: 141 mmol/L (ref 135–145)
Total Bilirubin: 0.2 mg/dL — ABNORMAL LOW (ref 0.3–1.2)
Total Protein: 5.5 g/dL — ABNORMAL LOW (ref 6.5–8.1)

## 2018-07-21 LAB — MAGNESIUM: Magnesium: 2.1 mg/dL (ref 1.7–2.4)

## 2018-07-21 LAB — TRIGLYCERIDES: Triglycerides: 107 mg/dL (ref <150)

## 2018-07-21 LAB — GLUCOSE, CAPILLARY
Glucose-Capillary: 381 mg/dL — ABNORMAL HIGH (ref 70–99)
Glucose-Capillary: 323 mg/dL — ABNORMAL HIGH (ref 70–99)
Glucose-Capillary: 191 mg/dL — ABNORMAL HIGH (ref 70–99)

## 2018-07-21 MED ORDER — FUROSEMIDE 10 MG/ML IJ SOLN
60.0000 mg | Freq: Once | INTRAMUSCULAR | Status: AC
  Administered 2018-07-21: 60 mg via INTRAVENOUS
  Filled 2018-07-21: qty 6

## 2018-07-21 MED ORDER — HYDROCODONE-ACETAMINOPHEN 5-325 MG PO TABS: 1.0000 | ORAL_TABLET | Freq: Four times a day (QID) | ORAL | 0 refills | Status: AC | PRN

## 2018-07-21 MED ORDER — SODIUM PHOSPHATES 45 MMOLE/15ML IV SOLN
10.0000 mmol | Freq: Once | INTRAVENOUS | Status: AC
  Administered 2018-07-21: 08:00:00 10 mmol via INTRAVENOUS
  Filled 2018-07-21: qty 3.33

## 2018-07-21 MED ORDER — INSULIN GLARGINE 100 UNIT/ML ~~LOC~~ SOLN: 15.0000 [IU] | Freq: Two times a day (BID) | SUBCUTANEOUS | Status: AC

## 2018-07-21 MED ORDER — PREDNISONE 20 MG PO TABS: 40.0000 mg | ORAL_TABLET | Freq: Every day | ORAL | 0 refills | Status: AC

## 2018-07-21 MED ORDER — INSULIN GLARGINE 100 UNIT/ML ~~LOC~~ SOLN
5.0000 [IU] | Freq: Two times a day (BID) | SUBCUTANEOUS | Status: DC
  Administered 2018-07-21: 5 [IU] via SUBCUTANEOUS
  Filled 2018-07-21 (×2): qty 0.05

## 2018-07-21 MED ORDER — HEPARIN SOD (PORK) LOCK FLUSH 100 UNIT/ML IV SOLN
250.0000 [IU] | INTRAVENOUS | Status: AC | PRN
  Administered 2018-07-21 (×2): 250 [IU]

## 2018-07-21 NOTE — TOC Transition Note (Addendum)
Transition of Care [facility]) - CM/SW Discharge Note   Patient Details  Name: Timothy Garcia MRN: [id number] Date of Birth: [birth year]  Transition of Care [facility] CM/SW Contact:  Benard Halsted, LCSW Phone Number: 07/21/2018, 3:29 PM   Clinical Narrative:    Admission paperwork complete. [facility] providing a Medicaid application to family.   Patient will DC to: [facility] Anticipated DC date: 07/21/18 Family notified: Sister Transport by: PTAR 4:30pm   Per MD patient ready for DC to [facility]. RN, patient, patient's family, and facility notified of DC. Discharge Summary and FL2 sent to facility. RN to call report prior to discharge [phone] [phone]). DC packet on chart. Ambulance transport requested for patient.   CSW will sign off for now as social work intervention is no longer needed. Please consult [name] again if new needs arise.  Cedric Fishman, LCSW Clinical Social Worker [phone]    Final next level of care: Skilled Nursing Facility Barriers to Discharge: Continued Medical Work up   Patient Goals and CMS Choice Patient states their goals for this hospitalization and ongoing recovery are:: Return to snf CMS Medicare.gov Compare Post Acute Care list provided to:: (NA-from SNF) Choice offered to / list presented to : Sibling  Discharge Placement                      Discharge Plan and Services In-house Referral: Clinical Social Work   Post Acute Care Choice: [place]          DME Arranged: N/A DME Agency: NA       HH Arranged: NA HH Agency: NA        Social Determinants of Health (SDOH) Interventions     Readmission Risk Interventions No flowsheet data found.

## 2018-07-21 NOTE — Progress Notes (Signed)
Physical Therapy Treatment Patient Details Name: Timothy Garcia MRN: [id number] DOB: September 13, 1934 Today's Date: 07/21/2018    History of Present Illness Patient is a 84 y/o male who presents with SOB, AMS and fever; found to have sepsis and COPD exacerbation. Chest CT- small bil pleural effusions with atelectasis. PMH includes HTN, DM, COPD, CKD, stomach ca.     PT Comments    Pt performed bed mobility and transfer training with max to achieve partial stance and moderate to achieve sitting edge of bed.  He does initiate movements and follows commands but lacks strength to complete tasks.  Pt continues to present with need to return to snf.  Will continue to follow during acute hospitalization.    Follow Up Recommendations  SNF;Supervision for mobility/OOB;Supervision/Assistance - 24 hour     Equipment Recommendations  None recommended by PT    Recommendations for Other Services       Precautions / Restrictions Precautions Precautions: Fall Precaution Comments: watch 02 Restrictions Weight Bearing Restrictions: No    Mobility  Bed Mobility Overal bed mobility: Needs Assistance Bed Mobility: Supine to Sit     Supine to sit: Mod assist     General bed mobility comments: Pt required moderate assistance to elevate trunk into sitting and advance Legs and hips to edge of bed.  Once edge of bed he was able to maintain static sitting unassisted.  When preparing for set-up of stedy frame he was unable to flex L hip to achieve foot placement on stedy plate.  Pt required assistance to achieve.    Transfers Overall transfer level: Needs assistance Equipment used: Rolling walker (2 wheeled) Transfers: Sit to/from Stand Sit to Stand: Max assist;From elevated surface         General transfer comment: Max assistance to achieve partial flexed stance in stedy frame.  required multiple attempts to achieve enough hip extension to place stedy plates.    Ambulation/Gait Ambulation/Gait  assistance: (NT unable)           General Gait Details: Unable   Stairs             Wheelchair Mobility    Modified Rankin (Stroke Patients Only)       Balance     Sitting balance-Leahy Scale: Fair       Standing balance-Leahy Scale: Zero Standing balance comment: Unable to get fully upright despite Max A and use of stedy frame.                              Cognition Arousal/Alertness: Awake/alert;Lethargic Behavior During Therapy: WFL for tasks assessed/performed Overall Cognitive Status: Impaired/Different from baseline Area of Impairment: Orientation;Following commands;Problem solving                 Orientation Level: Place;Time;Situation(required cues to recall place)     Following Commands: Follows one step commands with increased time;Follows multi-step commands inconsistently     Problem Solving: Slow processing;Decreased initiation;Difficulty sequencing;Requires verbal cues;Requires tactile cues        Exercises      General Comments        Pertinent Vitals/Pain Pain Assessment: Faces Faces Pain Scale: No hurt    Home Living                      Prior Function            PT Goals (current goals can now be found in the  care plan section) Acute Rehab PT Goals Patient Stated Goal: to go back to [facility] Potential to Achieve Goals: Good Progress towards PT goals: Progressing toward goals    Frequency    Min 2X/week      PT Plan Current plan remains appropriate    Co-evaluation              AM-PAC PT "6 Clicks" Mobility   Outcome Measure  Help needed turning from your back to your side while in a flat bed without using bedrails?: A Lot Help needed moving from lying on your back to sitting on the side of a flat bed without using bedrails?: A Lot Help needed moving to and from a bed to a chair (including a wheelchair)?: Total Help needed standing up from a chair using your arms (e.g.,  wheelchair or bedside chair)?: Total Help needed to walk in hospital room?: Total Help needed climbing 3-5 steps with a railing? : Total 6 Click Score: 8    End of Session Equipment Utilized During Treatment: Oxygen;Gait belt Activity Tolerance: Patient limited by fatigue Patient left: with call bell/phone within reach;in chair;with chair alarm set;with nursing/sitter in room(nursing in room adminsitering meds) Nurse Communication: Mobility status;Need for lift equipment(+2 assistance with sara stedy) PT Visit Diagnosis: Muscle weakness (generalized) (M62.81);Unsteadiness on feet (R26.81)     Time: 1040-1100 PT Time Calculation (min) (ACUTE ONLY): 20 min  Charges:  $Therapeutic Activity: 8-22 mins                     Timothy Garcia, Timothy Garcia Acute Rehabilitation Services Pager [phone] Office [phone]     Timothy Garcia Timothy Garcia 07/21/2018, 11:11 AM

## 2018-07-21 NOTE — Discharge Summary (Signed)
Physician Discharge Summary  Timothy Garcia WCH:[id number] DOB: 1934/05/23 DOA: 07/16/2018  PCP: Rogers Blocker, MD  Admit date: 07/16/2018 Discharge date: 07/21/2018  Admitted From: SNF Disposition: SNF  Recommendations for Outpatient Follow-up:  1. Follow up with PCP in 1 week with repeat CBC/BMP 2. Outpatient follow-up with pulmonary 3. Will benefit from outpatient follow-up with palliative care 4. Follow-up with primary surgeon and oncology as an outpatient 5. Follow up in ED if symptoms worsen or new appear   Home Health: No Equipment/Devices: Oxygen via nasal cannula Discharge Condition: Guarded CODE STATUS: Full Diet recommendation: Heart healthy/carb modified/full liquid  Brief/Interim Summary: 84 year old male with history of COPD, chronic DVTs, diabetes, chronic renal disease, hypertension, stomach cancer with distal gastrectomy, chronic kidney disease stage III presented on 07/16/2018 from skilled nursing facility with shortness of breath and altered mental status.  He was initially hypoxic and needed nonrebreather.  COVID-19 testing was negative.  CT chest showed small bilateral pleural effusions with adjacent atelectasis but no PE and no obvious infiltrates.  Patient was started on broad-spectrum antibiotics.  Blood cultures grew staph epidermidis.  Indwelling PICC line was removed.  Repeat cultures are negative so far.  Antibiotics discontinued.  His mental status is much improved.  He was treated with intravenous Solu-Medrol for COPD exacerbation and subsequently switched to oral prednisone.  Continue prednisone for 5 more days.  A new PICC line was placed on 07/20/2018 and TPN was restarted.  She will be discharged back to SNF today.  he will need palliative care evaluation and follow-up as an outpatient.  Discharge Diagnoses:  Principal Problem:   Sepsis ([place]) Active Problems:   Hyperglycemia   Benign essential HTN   AMS (altered mental status)   COPD with acute exacerbation  (HCC)   CKD (chronic kidney disease) stage 3, GFR 30-59 ml/min (HCC)   NSVT (nonsustained ventricular tachycardia) (HCC)  Sepsis: Present on admission -CT of the chest did not show obvious infiltrate. -Presented with fever and leukocytosis with altered mental status. -Started on vancomycin and Zosyn.  Blood cultures growing staph epidermidis.  Unclear if this is a contaminant versus a real pathogen as patient presented with an indwelling PICC line.  PICC line was discontinued on 07/17/2018.   Vancomycin discontinued on 07/20/2018 after repeat cultures from 07/18/2018 remain negative.  Zosyn discontinued on 07/18/2018.   -Discharge patient back to SNF today.  Acute hypoxic respiratory failure Probable chronic hypoxic respiratory failure -May be secondary to COPD exacerbation -Patient was on nonrebreather on presentation.  Respiratory status improving. -It looks like patient is chronically on oxygen via nasal cannula in a nursing facility.  Currently at 3 L/min oxygen via nasal cannula which is probably his baseline. -Chest x-ray from 07/20/2018 showed persistent bibasilar opacities with atelectasis.  Will give 1 dose of intravenous Lasix prior to discharge to SNF today.  COPD with exacerbation -Respiratory status improving.    Treated with Solu-Medrol and was switched to oral prednisone.   Discharged on oral prednisone for 5 more days.  Continue outpatient regimen. -Outpatient follow-up with pulmonary  Acute metabolic encephalopathy -Probably from above. -Patient was extremely confused on presentation.  Mental status has much improved but is still slightly confused.  Monitor mental status. -Fall precautions. -PT/OT/SLP evaluation appreciated.  Continue full liquid diet as per SLP recommendations. -Vitamin B12, folate, TSH levels normal. -We will hold off on imaging of the brain for now.  Diabetes mellitus type 2 uncontrolled with hyperglycemia -Long-acting insulin was held initially  because of blood  sugars being extremely low.  Now the blood sugars are starting to go back up.  Will put him back on decreased dose of his regular long-acting insulin regimen.  Outpatient follow-up -Carb modified full liquid diet  Chronic kidney disease stage III -Creatinine stable.  Outpatient follow-up  History of gastric cancer status post partial gastrectomy currently on chronic TPN as an outpatient -TPN was initially held because of bacteremia.  Blood cultures from 07/18/2018 negative so far. -Indwelling PICC line was removed and a new PICC line was placed on 07/20/2018.  TPN was started on 07/20/2018. -Continue full liquid diet as per SLP recommendations.   -Outpatient follow-up with oncology and general surgery regarding advancement of diet.  Generalized debility -PT/OT. -Palliative care consult for goals of care appreciated.  Currently still full code. -Will benefit from outpatient palliative care evaluation and follow-up.  History of chronic DVTs -Continue therapeutic Lovenox.  Outpatient follow-up  Discharge Instructions  Discharge Instructions    Ambulatory referral to Pulmonology   Complete by:  As directed    COPD exacerbation/hypoxia   Diet - low sodium heart healthy   Complete by:  As directed    Diet Carb Modified   Complete by:  As directed    Diet full liquid   Complete by:  As directed    Increase activity slowly   Complete by:  As directed      Allergies as of 07/21/2018      Reactions   Ace Inhibitors Other (See Comments)   Unable to recall Unable to recall   Iothalamate Other (See Comments)   Patient does not know if allergic to this; able to receive current IV contrast (Omnipaque 350) without adverse effects       Medication List    STOP taking these medications   amoxicillin-clavulanate 875-125 MG tablet Commonly known as:  AUGMENTIN   cefTRIAXone  IVPB Commonly known as:  ROCEPHIN     TAKE these medications   acetaminophen 325 MG  tablet Commonly known as:  TYLENOL Take 650 mg by mouth every 6 (six) hours as needed for fever.   albuterol 1.25 MG/3ML nebulizer solution Commonly known as:  ACCUNEB Take 1 ampule by nebulization every 6 (six) hours as needed for wheezing.   docusate sodium 100 MG capsule Commonly known as:  COLACE Take 100 mg by mouth 2 (two) times daily.   enoxaparin 80 MG/0.8ML injection Commonly known as:  LOVENOX Inject 80 mg into the skin 2 (two) times a day.   fluconazole 100 MG tablet Commonly known as:  DIFLUCAN Take 100 mg by mouth daily.   Glucagon Emergency 1 MG Solr Generic drug:  Glucagon HCl Inject 1 mg into the muscle every 2 (two) hours as needed (Hyproglycenmia).   guaiFENesin 100 MG/5ML Soln Commonly known as:  ROBITUSSIN Take 5 mLs by mouth every 6 (six) hours.   hydrALAZINE 10 MG tablet Commonly known as:  APRESOLINE Take 10 mg by mouth every 6 (six) hours.   HYDROcodone-acetaminophen 5-325 MG tablet Commonly known as:  NORCO/VICODIN Take 1 tablet by mouth every 6 (six) hours as needed for moderate pain.   insulin aspart 100 UNIT/ML injection Commonly known as:  novoLOG Inject 1-9 Units into the skin 3 (three) times daily before meals. 101-150=1U 151-200=2U 201-250=3U 251-300=5U 301-350=7U >350 =9U CALL MD for BS>400 OR<60   insulin glargine 100 UNIT/ML injection Commonly known as:  LANTUS Inject 0.15 mLs (15 Units total) into the skin 2 (two) times daily. What changed:  how much to take   ipratropium-albuterol 0.5-2.5 (3) MG/3ML Soln Commonly known as:  DUONEB Take 3 mLs by nebulization 3 (three) times daily.   latanoprost 0.005 % ophthalmic solution Commonly known as:  XALATAN Place 1 drop into both eyes at bedtime.   lidocaine 5 % Commonly known as:  LIDODERM Place 1 patch onto the skin daily. Remove & Discard patch within 12 hours or as directed by MD   magic mouthwash Soln Take 5 mLs by mouth every 6 (six) hours. Switch and Swallow    metoCLOPramide 5 MG tablet Commonly known as:  REGLAN Take 5 mg by mouth every 6 (six) hours.   metoprolol tartrate 25 MG tablet Commonly known as:  LOPRESSOR Take 12.5 mg by mouth 2 (two) times daily.   predniSONE 20 MG tablet Commonly known as:  DELTASONE Take 2 tablets (40 mg total) by mouth daily with breakfast for 5 days. Start taking on:  Jul 22, 2018   Protonix 20 MG tablet Generic drug:  pantoprazole Take 20 mg by mouth 2 (two) times daily.   timolol 0.5 % ophthalmic solution Commonly known as:  TIMOPTIC Place 1 drop into both eyes daily.   TPN ADULT Inject 70.8 mLs into the vein every Monday, Wednesday, and Friday. 3-IN-1 With lipids  Over 24 hours in the evening   TPN ADULT Inject 70.8 mLs into the vein See admin instructions. Tue , thurs, sat and Sunday (Without  Lipids) over 24 hours  2-IN-1   [place]ron Neohaler 27.5-15.6 MCG Caps Generic drug:  Indacaterol-Glycopyrrolate Place 1 puff into inhaler and inhale 2 (two) times a day.      Contact information for after-discharge care    Destination    [facility] Preferred SNF .   Service:  Skilled Nursing Contact information: [place] [phone]             Allergies  Allergen Reactions  . Ace Inhibitors Other (See Comments)    Unable to recall Unable to recall   . Iothalamate Other (See Comments)    Patient does not know if allergic to this; able to receive current IV contrast (Omnipaque 350) without adverse effects     Consultations:  Palliative care   Procedures/Studies: Ct Angio Chest Pe W And/or Wo Contrast  Result Date: 07/16/2018 CLINICAL DATA:  Pneumonia.  Shortness of breath. EXAM: CT ANGIOGRAPHY CHEST WITH CONTRAST TECHNIQUE: Multidetector CT imaging of the chest was performed using the standard protocol during bolus administration of intravenous contrast. Multiplanar CT image reconstructions and MIPs were obtained to  evaluate the vascular anatomy. CONTRAST:  147mL OMNIPAQUE IOHEXOL 350 MG/ML SOLN COMPARISON:  CT chest dated 02/09/2015. FINDINGS: Cardiovascular: Evaluation is somewhat limited by motion artifact and streak artifact from the patient's arms. Given this limitation, no definite PE identified on today's exam. Coronary artery calcifications are noted. Atherosclerotic changes are noted of the thoracic aorta. The heart size is not significantly enlarged. Mediastinum/Nodes: No enlarged mediastinal, hilar, or axillary lymph nodes. Thyroid gland, trachea, and esophagus demonstrate no significant findings. Lungs/Pleura: Evaluation is limited by motion artifact. Again identified are extensive emphysematous changes bilaterally. There is chronic scarring at the right lung apex. There is atelectasis involving the bilateral lower lobes with near complete collapse of the left lower lobe. The trachea is unremarkable. There are small bilateral pleural effusions. Upper Abdomen: The patient appears to be status post prior gastric bypass. The remaining portions of the partially visualized upper abdomen are grossly unremarkable.  Musculoskeletal: No chest wall abnormality. No acute or significant osseous findings. Bilateral gynecomastia is noted. Review of the MIP images confirms the above findings. IMPRESSION: 1. Examination is limited by motion artifact and streak artifact from the patient's arms. 2. Given the limitations described above, no PE identified. Detection of pulmonary emboli at the segmental and subsegmental levels is severely limited. 3. Severe emphysematous changes. Stable scarring in the right upper lobe. 4. Small bilateral pleural effusions with adjacent atelectasis. Aortic Atherosclerosis (ICD10-I70.0) and Emphysema (ICD10-J43.9). Electronically Signed   By: Constance Holster M.D.   On: 07/16/2018 17:15   Dg Chest Port 1 View  Result Date: 07/20/2018 CLINICAL DATA:  Difficulty breathing. EXAM: PORTABLE CHEST 1 VIEW  COMPARISON:  Chest x-rays dated 07/17/2018 and 07/16/2018. Chest CT dated 07/16/2018. FINDINGS: Persistent bibasilar opacities, compatible with the small bilateral pleural effusions and adjacent atelectasis better demonstrated on earlier chest CT. Stable chronic scarring within the RIGHT upper lobe. No new lung findings. Heart size and mediastinal contours are stable. Osseous structures are unremarkable. IMPRESSION: 1. Persistent bibasilar opacities, compatible with the small bilateral pleural effusions and adjacent atelectasis better demonstrated on earlier chest CT. 2. No new lung findings. Electronically Signed   By: Franki Cabot M.D.   On: 07/20/2018 08:21   Dg Chest Port 1 View  Result Date: 07/17/2018 CLINICAL DATA:  84 year old male acute respiratory distress. EXAM: PORTABLE CHEST 1 VIEW COMPARISON:  CTA chest yesterday, and earlier. FINDINGS: Portable AP semi upright view at 0229 hours. Stable right PICC line. Stable cardiac size and mediastinal contours. Stable lung volumes. Bilateral layering pleural effusions were better demonstrated by CTA. Evidence of upper lobe predominant emphysema again noted. Confluent retrocardiac opacity persists, but most resembles atelectasis on CTA. No areas of worsening ventilation. Visualized tracheal air column is within normal limits. Negative visible bowel gas pattern. No acute osseous abnormality identified. IMPRESSION: Stable ventilation since yesterday. Pleural effusions and lung base atelectasis superimposed on emphysema as seen by CTA. Electronically Signed   By: Genevie Ann M.D.   On: 07/17/2018 03:11   Dg Chest Port 1 View  Result Date: 07/16/2018 CLINICAL DATA:  Pt here from [facility], facility reports pneumonia x 1 week, having SOB today EXAM: PORTABLE CHEST - 1 VIEW COMPARISON:  06/26/2018 FINDINGS: Right arm PICC line to the SVC. Left retrocardiac consolidation/atelectasis perhaps slightly improved. Probable small left pleural effusion as before. Central  pulmonary vascular congestion. Heart size upper limits normal for technique. Aortic Atherosclerosis (ICD10-170.0). No pneumothorax. Visualized bones unremarkable. IMPRESSION: 1. Left retrocardiac consolidation/atelectasis and small effusion, slightly improved. 2. Pulmonary vascular congestion Electronically Signed   By: Lucrezia Europe M.D.   On: 07/16/2018 15:20   Dg Chest Port 1 View  Result Date: 06/26/2018 CLINICAL DATA:  Chest pain. EXAM: PORTABLE CHEST 1 VIEW COMPARISON:  Radiograph of June 18, 2018. FINDINGS: Stable cardiomediastinal silhouette. Right-sided PICC line is unchanged in position. No pneumothorax is noted. Stable mild left pleural effusion is noted with associated atelectasis or infiltrate. Minimal right basilar subsegmental atelectasis is noted. Bony thorax is unremarkable. IMPRESSION: Stable mild left pleural effusion is noted with associated atelectasis or infiltrate. Electronically Signed   By: Marijo Conception M.D.   On: 06/26/2018 12:19   [name] Ekg Site Rite  Result Date: 07/20/2018 If Site Rite image not attached, placement could not be confirmed due to current cardiac rhythm.    Subjective: Patient seen and examined at bedside. Hard of hearing, answers some questions.  Still has some minimal confusion.  No overnight fever or vomiting reported.  Discharge Exam: Vitals:   07/21/18 0536 07/21/18 0752  BP: 134/85   Pulse: 80   Resp: 16   Temp: 97.8 F (36.6 C)   SpO2: 99% 96%    General exam: No acute distress.  Elderly male lying in bed.  Mental status has much improved.  Still has some confusion.   Respiratory system: Bilateral decreased breath sounds at bases with scattered crackles.  No wheezing Cardiovascular system: Rate controlled, S1-S2 heard gastrointestinal system: Abdomen is nondistended, soft and nontender. Normal bowel sounds heard. Extremities: No cyanosis or edema    The results of significant diagnostics from this hospitalization (including imaging,  microbiology, ancillary and laboratory) are listed below for reference.     Microbiology: Recent Results (from the past 240 hour(s))  Culture, blood (Routine x 2)     Status: Abnormal   Collection Time: 07/16/18  2:15 PM  Result Value Ref Range Status   Specimen Description BLOOD LEFT ANTECUBITAL  Final   Special Requests   Final    BOTTLES DRAWN AEROBIC AND ANAEROBIC Blood Culture results may not be optimal due to an inadequate volume of blood received in culture bottles   Culture  Setup Time   Final    GRAM POSITIVE COCCI IN BOTH AEROBIC AND ANAEROBIC BOTTLES CRITICAL VALUE NOTED.  VALUE IS CONSISTENT WITH PREVIOUSLY REPORTED AND CALLED VALUE.    Culture (A)  Final    STAPHYLOCOCCUS EPIDERMIDIS SUSCEPTIBILITIES PERFORMED ON PREVIOUS CULTURE WITHIN THE LAST 5 DAYS. Performed at [facility], [place] [address], [place], [place] [address]    Report Status 07/19/2018 FINAL  Final  SARS Coronavirus 2 (CEPHEID- Performed in [place] hospital lab), Hosp Order     Status: None   Collection Time: 07/16/18  2:16 PM  Result Value Ref Range Status   SARS Coronavirus 2 NEGATIVE NEGATIVE Final    Comment: (NOTE) If result is NEGATIVE SARS-CoV-2 target nucleic acids are NOT DETECTED. The SARS-CoV-2 RNA is generally detectable in upper and lower  respiratory specimens during the acute phase of infection. The lowest  concentration of SARS-CoV-2 viral copies this assay can detect is 250  copies / mL. A negative result does not preclude SARS-CoV-2 infection  and should not be used as the sole basis for treatment or other  patient management decisions.  A negative result may occur with  improper specimen collection / handling, submission of specimen other  than nasopharyngeal swab, presence of viral mutation(s) within the  areas targeted by this assay, and inadequate number of viral copies  (<250 copies / mL). A negative result must be combined with clinical  observations, patient history,  and epidemiological information. If result is POSITIVE SARS-CoV-2 target nucleic acids are DETECTED. The SARS-CoV-2 RNA is generally detectable in upper and lower  respiratory specimens dur ing the acute phase of infection.  Positive  results are indicative of active infection with SARS-CoV-2.  Clinical  correlation with patient history and other diagnostic information is  necessary to determine patient infection status.  Positive results do  not rule out bacterial infection or co-infection with other viruses. If result is PRESUMPTIVE POSTIVE SARS-CoV-2 nucleic acids MAY BE PRESENT.   A presumptive positive result was obtained on the submitted specimen  and confirmed on repeat testing.  While 2019 novel coronavirus  (SARS-CoV-2) nucleic acids may be present in the submitted sample  additional confirmatory testing may be necessary for epidemiological  and / or clinical management purposes  to differentiate between  SARS-CoV-2 and other Sarbecovirus currently known to infect humans.  If clinically indicated additional testing with an alternate test  methodology [phone]) is advised. The SARS-CoV-2 RNA is generally  detectable in upper and lower respiratory sp ecimens during the acute  phase of infection. The expected result is Negative. Fact Sheet for Patients:  StrictlyIdeas.no Fact Sheet for Healthcare Providers: BankingDealers.co.za This test is not yet approved or cleared by the [place] FDA and has been authorized for detection and/or diagnosis of SARS-CoV-2 by FDA under an Emergency Use Authorization (EUA).  This EUA will remain in effect (meaning this test can be used) for the duration of the COVID-19 declaration under Section 564(b)(1) of the Act, 21 U.S.C. section 360bbb-3(b)(1), unless the authorization is terminated or revoked sooner. Performed at [facility], [place] [address], [place], [place] [address]   Culture,  blood (Routine x 2)     Status: Abnormal   Collection Time: 07/16/18  2:20 PM  Result Value Ref Range Status   Specimen Description BLOOD LEFT HAND  Final   Special Requests   Final    BOTTLES DRAWN AEROBIC AND ANAEROBIC Blood Culture results may not be optimal due to an inadequate volume of blood received in culture bottles   Culture  Setup Time   Final    GRAM POSITIVE COCCI ANAEROBIC BOTTLE ONLY CRITICAL RESULT CALLED TO, READ BACK BY AND VERIFIED WITH: Weldon Picking [phone] [id number] FCP Performed at [facility], [place] [address] [place], [place] [address]    Culture STAPHYLOCOCCUS EPIDERMIDIS (A)  Final   Report Status 07/19/2018 FINAL  Final   Organism ID, Bacteria STAPHYLOCOCCUS EPIDERMIDIS  Final      Susceptibility   Staphylococcus epidermidis - MIC*    CIPROFLOXACIN >=8 RESISTANT Resistant     ERYTHROMYCIN >=8 RESISTANT Resistant     GENTAMICIN <=0.5 SENSITIVE Sensitive     OXACILLIN >=4 RESISTANT Resistant     TETRACYCLINE 2 SENSITIVE Sensitive     VANCOMYCIN 1 SENSITIVE Sensitive     TRIMETH/SULFA 80 RESISTANT Resistant     CLINDAMYCIN >=8 RESISTANT Resistant     RIFAMPIN <=0.5 SENSITIVE Sensitive     Inducible Clindamycin NEGATIVE Sensitive     * STAPHYLOCOCCUS EPIDERMIDIS  Blood Culture ID Panel (Reflexed)     Status: Abnormal   Collection Time: 07/16/18  2:20 PM  Result Value Ref Range Status   Enterococcus species NOT DETECTED NOT DETECTED Final   Listeria monocytogenes NOT DETECTED NOT DETECTED Final   Staphylococcus species DETECTED (A) NOT DETECTED Final    Comment: Methicillin (oxacillin) resistant coagulase negative staphylococcus. Possible blood culture contaminant (unless isolated from more than one blood culture draw or clinical case suggests pathogenicity). No antibiotic treatment is indicated for blood  culture contaminants. CRITICAL RESULT CALLED TO, READ BACK BY AND VERIFIED WITH: PHARM D ERIN  D 1328 [id number] FCP    Staphylococcus aureus (BCID) NOT  DETECTED NOT DETECTED Final   Methicillin resistance DETECTED (A) NOT DETECTED Final    Comment: CRITICAL RESULT CALLED TO, READ BACK BY AND VERIFIED WITH: PHARM D ERIN  D 1328 [id number] FCP    Streptococcus species NOT DETECTED NOT DETECTED Final   Streptococcus agalactiae NOT DETECTED NOT DETECTED Final   Streptococcus pneumoniae NOT DETECTED NOT DETECTED Final   Streptococcus pyogenes NOT DETECTED NOT DETECTED Final   Acinetobacter baumannii NOT DETECTED NOT DETECTED Final   Enterobacteriaceae species NOT DETECTED NOT DETECTED Final  Enterobacter cloacae complex NOT DETECTED NOT DETECTED Final   Escherichia coli NOT DETECTED NOT DETECTED Final   Klebsiella oxytoca NOT DETECTED NOT DETECTED Final   Klebsiella pneumoniae NOT DETECTED NOT DETECTED Final   Proteus species NOT DETECTED NOT DETECTED Final   Serratia marcescens NOT DETECTED NOT DETECTED Final   Haemophilus influenzae NOT DETECTED NOT DETECTED Final   Neisseria meningitidis NOT DETECTED NOT DETECTED Final   Pseudomonas aeruginosa NOT DETECTED NOT DETECTED Final   Candida albicans NOT DETECTED NOT DETECTED Final   Candida glabrata NOT DETECTED NOT DETECTED Final   Candida krusei NOT DETECTED NOT DETECTED Final   Candida parapsilosis NOT DETECTED NOT DETECTED Final   Candida tropicalis NOT DETECTED NOT DETECTED Final    Comment: Performed at [facility], [place] [address] [place], [place] [address]  Urine culture     Status: None   Collection Time: 07/16/18  4:17 PM  Result Value Ref Range Status   Specimen Description URINE, RANDOM  Final   Special Requests NONE  Final   Culture   Final    NO GROWTH Performed at [facility], [place] [address], [place], [place] [address]    Report Status 07/17/2018 FINAL  Final  MRSA PCR Screening     Status: None   Collection Time: 07/16/18 11:44 PM  Result Value Ref Range Status   MRSA by PCR NEGATIVE NEGATIVE Final    Comment:        The GeneXpert MRSA Assay (FDA approved  for NASAL specimens only), is one component of a comprehensive MRSA colonization surveillance program. It is not intended to diagnose MRSA infection nor to guide or monitor treatment for MRSA infections. Performed at [facility], [place] [address] [place], [place] [address]   Culture, blood (routine x 2)     Status: None (Preliminary result)   Collection Time: 07/18/18  5:43 AM  Result Value Ref Range Status   Specimen Description BLOOD LEFT ARM  Final   Special Requests   Final    BOTTLES DRAWN AEROBIC ONLY Blood Culture adequate volume   Culture   Final    NO GROWTH 3 DAYS Performed at [facility], [place] [address], [place], [place] [address]    Report Status PENDING  Incomplete  Culture, blood (routine x 2)     Status: None (Preliminary result)   Collection Time: 07/18/18  5:49 AM  Result Value Ref Range Status   Specimen Description BLOOD LEFT HAND  Final   Special Requests   Final    BOTTLES DRAWN AEROBIC ONLY Blood Culture adequate volume   Culture   Final    NO GROWTH 3 DAYS Performed at [facility], [place] [address], [place], [place] [address]    Report Status PENDING  Incomplete     Labs: BNP (last 3 results) Recent Labs    07/16/18 1416  BNP 622.6*   Basic Metabolic Panel: Recent Labs  Lab 07/17/18 0408 07/18/18 0826 07/19/18 0245 07/20/18 0259 07/21/18 0326  NA 138 140 143 145 141  K 4.6 4.2 3.4* 4.4 4.2  CL 105 109 110 111 111  CO2 25 23 23 29 26   GLUCOSE 199* 110* 67* 148* 362*  BUN 21 26* 23 18 20   CREATININE 1.03 1.01 0.92 0.87 0.82  CALCIUM 8.2* 7.8* 7.9* 7.8* 7.6*  MG  --  1.9 2.0 2.1 2.1  PHOS  --   --   --   --  2.2*   Liver Function Tests: Recent Labs  Lab 07/16/18 1416 07/17/18 0408 07/18/18 0826 07/21/18 0326  AST 20 19 45* 18  ALT 41 34 55* 45*  ALKPHOS 148* 129* 124 101  BILITOT 0.1* 0.3 0.5 0.2*  PROT 7.9 6.6 6.5 5.5*  ALBUMIN 2.0* 1.6* 1.6* 1.6*   Recent Labs  Lab 07/16/18 1416  LIPASE 51   Recent  Labs  Lab 07/19/18 0245  AMMONIA 15   CBC: Recent Labs  Lab 07/16/18 1416  07/17/18 0408 07/18/18 0826 07/19/18 0245 07/20/18 0259 07/21/18 0326  WBC 13.1*  --  10.0 11.7* 9.8 9.2 7.7  NEUTROABS 10.5*  --   --  10.5* 8.4* 7.0 5.8  HGB 9.5*   < > 8.1* 8.2* 8.4* 9.6* 8.8*  HCT 32.3*   < > 27.0* 26.6* 27.3* 31.6* 29.4*  MCV 89.5  --  86.5 85.0 84.8 86.6 88.3  PLT 372  --  266 347 385 437* 397   < > = values in this interval not displayed.   Cardiac Enzymes: Recent Labs  Lab 07/16/18 1416  TROPONINI <0.03   BNP: Invalid input(s): POCBNP CBG: Recent Labs  Lab 07/20/18 0808 07/20/18 1151 07/20/18 1706 07/20/18 2112 07/21/18 0810  GLUCAP 188* 123* 116* 307* 381*   D-Dimer No results for input(s): DDIMER in the last 72 hours. Hgb A1c No results for input(s): HGBA1C in the last 72 hours. Lipid Profile Recent Labs    07/21/18 0326  TRIG 107   Thyroid function studies Recent Labs    07/19/18 0245  TSH 2.185   Anemia work up Recent Labs    07/19/18 0245  VITAMINB12 670  FOLATE 13.8   Urinalysis    Component Value Date/Time   COLORURINE YELLOW 07/16/2018 1416   APPEARANCEUR HAZY (A) 07/16/2018 1416   LABSPEC 1.018 07/16/2018 1416   PHURINE 5.0 07/16/2018 1416   GLUCOSEU 150 (A) 07/16/2018 1416   [place] 07/16/2018 1416   [place] 07/16/2018 1416   [place] 07/16/2018 1416   PROTEINUR 30 (A) 07/16/2018 1416   NITRITE NEGATIVE 07/16/2018 1416   LEUKOCYTESUR NEGATIVE 07/16/2018 1416   Sepsis Labs Invalid input(s): PROCALCITONIN,  WBC,  LACTICIDVEN Microbiology Recent Results (from the past 240 hour(s))  Culture, blood (Routine x 2)     Status: Abnormal   Collection Time: 07/16/18  2:15 PM  Result Value Ref Range Status   Specimen Description BLOOD LEFT ANTECUBITAL  Final   Special Requests   Final    BOTTLES DRAWN AEROBIC AND ANAEROBIC Blood Culture results may not be optimal due to an inadequate volume of blood  received in culture bottles   Culture  Setup Time   Final    GRAM POSITIVE COCCI IN BOTH AEROBIC AND ANAEROBIC BOTTLES CRITICAL VALUE NOTED.  VALUE IS CONSISTENT WITH PREVIOUSLY REPORTED AND CALLED VALUE.    Culture (A)  Final    STAPHYLOCOCCUS EPIDERMIDIS SUSCEPTIBILITIES PERFORMED ON PREVIOUS CULTURE WITHIN THE LAST 5 DAYS. Performed at [facility], [place] [address], [place], [place] [address]    Report Status 07/19/2018 FINAL  Final  SARS Coronavirus 2 (CEPHEID- Performed in [place] [facility] lab), Hosp Order     Status: None   Collection Time: 07/16/18  2:16 PM  Result Value Ref Range Status   SARS Coronavirus 2 NEGATIVE NEGATIVE Final    Comment: (NOTE) If result is NEGATIVE SARS-CoV-2 target nucleic acids are NOT DETECTED. The SARS-CoV-2 RNA is generally detectable in upper and lower  respiratory specimens during the acute phase of infection. The lowest  concentration of SARS-CoV-2 viral copies this assay can detect is 250  copies / mL. A negative result does not preclude SARS-CoV-2 infection  and should not be used as the sole basis for treatment or other  patient management decisions.  A negative result may occur with  improper specimen collection / handling, submission of specimen other  than nasopharyngeal swab, presence of viral mutation(s) within the  areas targeted by this assay, and inadequate number of viral copies  (<250 copies / mL). A negative result must be combined with clinical  observations, patient history, and epidemiological information. If result is POSITIVE SARS-CoV-2 target nucleic acids are DETECTED. The SARS-CoV-2 RNA is generally detectable in upper and lower  respiratory specimens dur ing the acute phase of infection.  Positive  results are indicative of active infection with SARS-CoV-2.  Clinical  correlation with patient history and other diagnostic information is  necessary to determine patient infection status.  Positive results do   not rule out bacterial infection or co-infection with other viruses. If result is PRESUMPTIVE POSTIVE SARS-CoV-2 nucleic acids MAY BE PRESENT.   A presumptive positive result was obtained on the submitted specimen  and confirmed on repeat testing.  While 2019 novel coronavirus  (SARS-CoV-2) nucleic acids may be present in the submitted sample  additional confirmatory testing may be necessary for epidemiological  and / or clinical management purposes  to differentiate between  SARS-CoV-2 and other Sarbecovirus currently known to infect humans.  If clinically indicated additional testing with an alternate test  methodology [phone]) is advised. The SARS-CoV-2 RNA is generally  detectable in upper and lower respiratory sp ecimens during the acute  phase of infection. The expected result is Negative. Fact Sheet for Patients:  StrictlyIdeas.no Fact Sheet for Healthcare Providers: BankingDealers.co.za This test is not yet approved or cleared by the [place] FDA and has been authorized for detection and/or diagnosis of SARS-CoV-2 by FDA under an Emergency Use Authorization (EUA).  This EUA will remain in effect (meaning this test can be used) for the duration of the COVID-19 declaration under Section 564(b)(1) of the Act, 21 U.S.C. section 360bbb-3(b)(1), unless the authorization is terminated or revoked sooner. Performed at [facility], [place] [address], [place], [place] [address]   Culture, blood (Routine x 2)     Status: Abnormal   Collection Time: 07/16/18  2:20 PM  Result Value Ref Range Status   Specimen Description BLOOD LEFT HAND  Final   Special Requests   Final    BOTTLES DRAWN AEROBIC AND ANAEROBIC Blood Culture results may not be optimal due to an inadequate volume of blood received in culture bottles   Culture  Setup Time   Final    GRAM POSITIVE COCCI ANAEROBIC BOTTLE ONLY CRITICAL RESULT CALLED TO, READ BACK BY AND  VERIFIED WITH: Weldon Picking [phone] [id number] FCP Performed at [facility], [place] [address], [place], [place] [address]    Culture STAPHYLOCOCCUS EPIDERMIDIS (A)  Final   Report Status 07/19/2018 FINAL  Final   Organism ID, Bacteria STAPHYLOCOCCUS EPIDERMIDIS  Final      Susceptibility   Staphylococcus epidermidis - MIC*    CIPROFLOXACIN >=8 RESISTANT Resistant     ERYTHROMYCIN >=8 RESISTANT Resistant     GENTAMICIN <=0.5 SENSITIVE Sensitive     OXACILLIN >=4 RESISTANT Resistant     TETRACYCLINE 2 SENSITIVE Sensitive     VANCOMYCIN 1 SENSITIVE  Sensitive     TRIMETH/SULFA 80 RESISTANT Resistant     CLINDAMYCIN >=8 RESISTANT Resistant     RIFAMPIN <=0.5 SENSITIVE Sensitive     Inducible Clindamycin NEGATIVE Sensitive     * STAPHYLOCOCCUS EPIDERMIDIS  Blood Culture ID Panel (Reflexed)     Status: Abnormal   Collection Time: 07/16/18  2:20 PM  Result Value Ref Range Status   Enterococcus species NOT DETECTED NOT DETECTED Final   Listeria monocytogenes NOT DETECTED NOT DETECTED Final   Staphylococcus species DETECTED (A) NOT DETECTED Final    Comment: Methicillin (oxacillin) resistant coagulase negative staphylococcus. Possible blood culture contaminant (unless isolated from more than one blood culture draw or clinical case suggests pathogenicity). No antibiotic treatment is indicated for blood  culture contaminants. CRITICAL RESULT CALLED TO, READ BACK BY AND VERIFIED WITH: PHARM D ERIN  D 1328 [id number] FCP    Staphylococcus aureus (BCID) NOT DETECTED NOT DETECTED Final   Methicillin resistance DETECTED (A) NOT DETECTED Final    Comment: CRITICAL RESULT CALLED TO, READ BACK BY AND VERIFIED WITH: PHARM D ERIN  D 1328 [id number] FCP    Streptococcus species NOT DETECTED NOT DETECTED Final   Streptococcus agalactiae NOT DETECTED NOT DETECTED Final   Streptococcus pneumoniae NOT DETECTED NOT DETECTED Final   Streptococcus pyogenes NOT DETECTED NOT DETECTED Final   Acinetobacter baumannii  NOT DETECTED NOT DETECTED Final   Enterobacteriaceae species NOT DETECTED NOT DETECTED Final   Enterobacter cloacae complex NOT DETECTED NOT DETECTED Final   Escherichia coli NOT DETECTED NOT DETECTED Final   Klebsiella oxytoca NOT DETECTED NOT DETECTED Final   Klebsiella pneumoniae NOT DETECTED NOT DETECTED Final   Proteus species NOT DETECTED NOT DETECTED Final   Serratia marcescens NOT DETECTED NOT DETECTED Final   Haemophilus influenzae NOT DETECTED NOT DETECTED Final   Neisseria meningitidis NOT DETECTED NOT DETECTED Final   Pseudomonas aeruginosa NOT DETECTED NOT DETECTED Final   Candida albicans NOT DETECTED NOT DETECTED Final   Candida glabrata NOT DETECTED NOT DETECTED Final   Candida krusei NOT DETECTED NOT DETECTED Final   Candida parapsilosis NOT DETECTED NOT DETECTED Final   Candida tropicalis NOT DETECTED NOT DETECTED Final    Comment: Performed at [facility], [address], [place], [place] [address]  Urine culture     Status: None   Collection Time: 07/16/18  4:17 PM  Result Value Ref Range Status   Specimen Description URINE, RANDOM  Final   Special Requests NONE  Final   Culture   Final    NO GROWTH Performed at [facility], [place] [address], [place], [place] [address]    Report Status 07/17/2018 FINAL  Final  MRSA PCR Screening     Status: None   Collection Time: 07/16/18 11:44 PM  Result Value Ref Range Status   MRSA by PCR NEGATIVE NEGATIVE Final    Comment:        The GeneXpert MRSA Assay (FDA approved for NASAL specimens only), is one component of a comprehensive MRSA colonization surveillance program. It is not intended to diagnose MRSA infection nor to guide or monitor treatment for MRSA infections. Performed at [facility], [place] [address], [place], [place] [address]   Culture, blood (routine x 2)     Status: None (Preliminary result)   Collection Time: 07/18/18  5:43 AM  Result Value Ref Range Status   Specimen Description  BLOOD LEFT ARM  Final   Special Requests   Final  BOTTLES DRAWN AEROBIC ONLY Blood Culture adequate volume   Culture   Final    NO GROWTH 3 DAYS Performed at [facility], [place] [address], [place], [place] [address]    Report Status PENDING  Incomplete  Culture, blood (routine x 2)     Status: None (Preliminary result)   Collection Time: 07/18/18  5:49 AM  Result Value Ref Range Status   Specimen Description BLOOD LEFT HAND  Final   Special Requests   Final    BOTTLES DRAWN AEROBIC ONLY Blood Culture adequate volume   Culture   Final    NO GROWTH 3 DAYS Performed at [facility], [place] [address] [place], [place] [address]    Report Status PENDING  Incomplete     Time coordinating discharge: 35 minutes  SIGNED:   Aline August, MD  [facility] 07/21/2018, 10:51 AM

## 2018-07-21 NOTE — Progress Notes (Signed)
Patient was discharged to nursing home ([facility]) by MD order; discharged instructions review to facility with care notes and prescriptions; IV DIC; facility was called and report was given to the nurse who is going to receive the patient; PICC line on LU arm capped by IV nurse; patient will be transported to facility via [place].

## 2018-07-21 NOTE — TOC Progression Note (Signed)
Transition of Care [facility]) - Progression Note    Patient Details  Name: Timothy Garcia MRN: [id number] Date of Birth: 1935/02/05  Transition of Care [facility]) CM/SW [place], [place] Phone Number: 07/21/2018, 2:26 PM  Clinical Narrative:    Family concerned over finances at SNF and therefore have not returned admission paperwork yet.   Expected Discharge Plan: [place] Barriers to Discharge: Continued Medical Work up  Expected Discharge Plan and Services Expected Discharge Plan: [place] In-house Referral: Clinical Social Work   Post Acute Care Choice: [place] Living arrangements for the past 2 months: [place] Expected Discharge Date: 07/21/18               DME Arranged: N/A DME Agency: NA       HH Arranged: NA HH Agency: NA         Social Determinants of Health (SDOH) Interventions    Readmission Risk Interventions No flowsheet data found.

## 2018-07-21 NOTE — Care Management Important Message (Signed)
Important Message  Patient Details  Name: Timothy Garcia MRN: [id number] Date of Birth: 1934/06/30   Medicare Important Message Given:  Yes    [name] 07/21/2018, 3:20 PM

## 2018-07-21 NOTE — Progress Notes (Signed)
[place] NOTE   Pharmacy Consult for TPN Indication: hx of gastrectomy  Patient Measurements: Height: 5\' 7"  (170.2 cm) Weight: 161 lb 2.5 oz (73.1 kg) IBW/kg (Calculated) : 66.1 TPN AdjBW (KG): 73.3 Body mass index is 25.24 kg/m.   Assessment: 84 yo m presenting on 5/21 with fever and SOB, r/o for COVID. Initial blood cx positive for staph epi and PICC line was removed. Repeat cultures were neg and abx discontinued on 5/25; now treating as COPD exacerbation  PMH COPD chronic DVT DM CKD HTN Hx of stomach cancer with distal gastrectomy on home TPN  GI: chronic TPN as outpatient  Prealbumin 16, LBM 5/25 Endo: 116 - 362s (on steroids - being weaned) Insulin requirements in the past 24 hours: 9 units Lytes: K 4.2, Mg 2.1, Phos 2.2 (will replace), coCa 9.5 (alb 1.6) Renal: SCr 0.82, BUN 20, UOP 0.3 ml/kg/hr Pulm: [place] Cards:  HTN Hepatobil: WNL Tbili 0.2, TGs 107 Neuro: no issues ID: s/p abx  TPN Access: new PICC 5/25 TPN start date: PTA  Nutritional Goals (per RD recommendation on 5/22): KCal: 1800 - 2000 Protein: 90 - 105 Fluid: 1.8 - 2 L  Home TPN: 70 mL/hr with 78 g protein/day, 312 g dextrose, and 50 g lipids on MWF. Total cal w lipids is 1872 / day  Based on RD recommendations patient is not meeting nutritional goals with Home TPN - goal TPN should be 80 mL/hr with 100 g protein, 52 g lipid (MWF), 307 g dextrose for total cal 1962 / day (patient only seems to be drinking 1 - 2 ensure / day - if increases then take that into account)  Current Nutrition: Full liquid diet, TPN  Plan:  Increase TPN 80 mL/hr   Lipids MWF like home regimen  This TPN provides 100 g of protein, and 307 g of dextrose which provides 1444 kCals per day, meeting 80 % of patient calorie needs and 100% of patient protein needs  Electrolytes in TPN: Standard Add MVI, trace elements MWF Continue QID SSI  Give NaPhos 10 mmol IV x 1  Monitor TPN labs and  SSI  Alanda Slim, PharmD, [facility] Clinical Pharmacist Please see AMION for all Pharmacists' Contact Phone Numbers 07/21/2018, 7:16 AM

## 2018-07-23 LAB — CULTURE, BLOOD (ROUTINE X 2)
Culture: NO GROWTH
Culture: NO GROWTH
Special Requests: ADEQUATE
Special Requests: ADEQUATE

## 2018-07-31 ENCOUNTER — Encounter: Payer: Self-pay | Admitting: Internal Medicine

## 2018-07-31 ENCOUNTER — Other Ambulatory Visit: Payer: Self-pay

## 2018-07-31 ENCOUNTER — Non-Acute Institutional Stay: Payer: Medicare Other | Admitting: Internal Medicine

## 2018-07-31 VITALS — Ht 67.0 in | Wt 157.2 lb

## 2018-07-31 DIAGNOSIS — Z515 Encounter for palliative care: Secondary | ICD-10-CM

## 2018-07-31 NOTE — Progress Notes (Signed)
June 5th, 2020 [place] Note Telephone: [phone]  Fax: [phone]  PATIENT NAME: Timothy Garcia DOB: 1934-07-08 MRN: [id number]  [facility] 4[facility]  Readmitted 07/21/18  PRIMARY CARE PROVIDER:   Rogers Blocker, MD Dr. Darrin Luis (Oncology) [facility] [phone]  REFERRING PROVIDER:   Suzzanne Cloud PA-C / Dr. Seward Carol  RESPONSIBLE PARTY: (sister) [name] [phone]. (daughter) Charlynn Court [phone]. (grand daughter) Theodoro Doing [phone]  ASSESSMENT:     1. Management of medical problems as per facility team  -Gastric Cancer: Dx'd 03/2018; s/p gastrectomy March 2020. PET scan 04/03/2018 negative for metastatic disease). On TNP with oral diet progression as tolerates. -Recurrent DVT bilateral LEs: Chronic anticoagulation. Current Lovenox till can absorb previous dose Eliquis  -COPD: chronic oxygen 3 LPM  2. Decline in functional status r/t recent hospitalizations/deconditioning/decreased nutrition: A&O x 3. Insight that he had previous surgery for gastric cancer which he believes was curative. He is currently dependent for assist with for transfers. Staff report unsuccessful attempt to stand. He is transferring with assist and tolerating being up in a wheelchair. He is able to feed himself; consuming 25 % of his full liquid diet. He continues on TPN. His weight is 157.2 lbs, which is a loss of 11.8 lbs (7% of body weight) over the last 4 months. At a height of 5'7" his BMI is 24.6 kg/m2. Staff note nausea, elevated CBGs, and a slight temp yesterday managed with Zofran. A f/u KUB was negative. He feels better today. He is able to alert staff as to his toileting needs, and is able to use the urinal. He has a dime size stage one pressure to R heel. He denies specific pain; just some generalized aches.  3. Family supports (from chart review): divorced when children young (ex-wife took them to [place]). His  sister lives nearby, another in [place]. Quit smoking in 2006 with diagnosis of diabetes. 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  4. Advanced Care Directives: Discussed patient wishes in event of a cardiopulmonary arrest. He would desire full resusitative efforts.   5. Goals of Care: At the current time, to gain strength and eat a regular diet.  6. Follow up Palliative Care Visit: will call to schedule in 4-6 weeks or so.  I spent 60 minutes providing this consultation,  from to 11 am to 12 noon. More than 50% of the time in this consultation was spent coordinating communication.   HISTORY OF PRESENT ILLNESS:  Timothy Garcia is an 84 y.o. male with history of COPD (chronic O2), recurrent DVT bilateral LEs and Pulmonary embolism (IVC filter and chronic anticoagulation), diabetes, chronic renal disease, hypertension, stomach cancer (subtotal distal gastrectomy with Roux-en-Y reconstruction [facility] Admission 3/5-3/25/2020), chronic kidney disease stage III, yeast esophagitis, and carotid artery occulusion/stenosis . Recent hospitalization 5/21-5/26/2020 for sepsis (? fm PIC line, which was removed and replaced) and COPD excerbation.  Palliative Care was asked to help address goals of care.   CODE STATUS: Full  PPS: 30% HOSPICE ELIGIBILITY/DIAGNOSIS: TBD  PAST MEDICAL HISTORY:  Past Medical History:  Diagnosis Date  . Anemia   . CKD (chronic kidney disease) stage 3, GFR 30-59 ml/min (HCC)   . COPD (chronic obstructive pulmonary disease) ([place])   . Diabetes mellitus without complication ([place])    type 2  . Hypertension   . Hypoxia   . Malignant neoplasm of  stomach ([place])   . Stenosis of carotid artery     SOCIAL HX:  Social History   Tobacco Use  . Smoking status: Unknown If Ever Smoked  Substance Use Topics  . Alcohol use: Not Currently    ALLERGIES:  Allergies  Allergen Reactions  . Ace Inhibitors Other (See Comments)     Unable to recall Unable to recall   . Iothalamate Other (See Comments)    Patient does not know if allergic to this; able to receive current IV contrast (Omnipaque 350) without adverse effects      PERTINENT MEDICATIONS:  Outpatient Encounter Medications as of 07/31/2018  Medication Sig  . acetaminophen (TYLENOL) 325 MG tablet Take 650 mg by mouth every 6 (six) hours as needed for fever.  Marland Kitchen albuterol (ACCUNEB) 1.25 MG/3ML nebulizer solution Take 1 ampule by nebulization every 6 (six) hours as needed for wheezing.  . docusate sodium (COLACE) 100 MG capsule Take 100 mg by mouth 2 (two) times daily.  Marland Kitchen enoxaparin (LOVENOX) 80 MG/0.8ML injection Inject 80 mg into the skin 2 (two) times a day.  . fluconazole (DIFLUCAN) 100 MG tablet Take 100 mg by mouth daily.  . Glucagon HCl (GLUCAGON EMERGENCY) 1 MG SOLR Inject 1 mg into the muscle every 2 (two) hours as needed (Hyproglycenmia).  Marland Kitchen guaiFENesin (ROBITUSSIN) 100 MG/5ML SOLN Take 5 mLs by mouth every 6 (six) hours.  . hydrALAZINE (APRESOLINE) 10 MG tablet Take 10 mg by mouth every 6 (six) hours.  Marland Kitchen HYDROcodone-acetaminophen (NORCO/VICODIN) 5-325 MG tablet Take 1 tablet by mouth every 6 (six) hours as needed for moderate pain.  . Indacaterol-Glycopyrrolate (UTIBRON NEOHALER) 27.5-15.6 MCG CAPS Place 1 puff into inhaler and inhale 2 (two) times a day.  . insulin aspart (NOVOLOG) 100 UNIT/ML injection Inject 1-9 Units into the skin 3 (three) times daily before meals. 101-150=1U 151-200=2U 201-250=3U 251-300=5U 301-350=7U >350 =9U CALL MD for BS>400 OR<60  . insulin glargine (LANTUS) 100 UNIT/ML injection Inject 0.15 mLs (15 Units total) into the skin 2 (two) times daily.  Marland Kitchen ipratropium-albuterol (DUONEB) 0.5-2.5 (3) MG/3ML SOLN Take 3 mLs by nebulization 3 (three) times daily.  Marland Kitchen latanoprost (XALATAN) 0.005 % ophthalmic solution Place 1 drop into both eyes at bedtime.  . lidocaine (LIDODERM) 5 % Place 1 patch onto the skin daily. Remove & Discard  patch within 12 hours or as directed by MD  . magic mouthwash SOLN Take 5 mLs by mouth every 6 (six) hours. Switch and Swallow  . metoCLOPramide (REGLAN) 5 MG tablet Take 5 mg by mouth every 6 (six) hours.  . metoprolol tartrate (LOPRESSOR) 25 MG tablet Take 12.5 mg by mouth 2 (two) times daily.  . pantoprazole (PROTONIX) 20 MG tablet Take 20 mg by mouth 2 (two) times daily.  . timolol (TIMOPTIC) 0.5 % ophthalmic solution Place 1 drop into both eyes daily.  . TPN ADULT Inject 70.8 mLs into the vein every Monday, Wednesday, and Friday. 3-IN-1 With lipids  Over 24 hours in the evening  . TPN ADULT Inject 70.8 mLs into the vein See admin instructions. Tue , thurs, sat and Sunday (Without  Lipids) over 24 hours  2-IN-1   No facility-administered encounter medications on file as of 07/31/2018.     PHYSICAL EXAM:   Limited exam d/t nature of telehealth visit/ General: NAD, fairly well nourished AA male laying supine in bed. He was alert and pleasantly conversant.  No dyspnea with conversation. Exposed skin without rashes/breakdown.  Julianne Handler, NP

## 2018-08-19 ENCOUNTER — Emergency Department (HOSPITAL_COMMUNITY): Payer: Medicare Other

## 2018-08-19 ENCOUNTER — Inpatient Hospital Stay (HOSPITAL_COMMUNITY)
Admission: EM | Admit: 2018-08-19 | Discharge: 2018-09-26 | DRG: 207 | Disposition: E | Payer: Medicare Other | Source: Skilled Nursing Facility | Attending: Pulmonary Disease | Admitting: Pulmonary Disease

## 2018-08-19 ENCOUNTER — Other Ambulatory Visit: Payer: Self-pay

## 2018-08-19 ENCOUNTER — Encounter (HOSPITAL_COMMUNITY): Payer: Self-pay

## 2018-08-19 DIAGNOSIS — Z9911 Dependence on respirator [ventilator] status: Secondary | ICD-10-CM

## 2018-08-19 DIAGNOSIS — Z20828 Contact with and (suspected) exposure to other viral communicable diseases: Secondary | ICD-10-CM | POA: Diagnosis present

## 2018-08-19 DIAGNOSIS — B961 Klebsiella pneumoniae [K. pneumoniae] as the cause of diseases classified elsewhere: Secondary | ICD-10-CM | POA: Diagnosis present

## 2018-08-19 DIAGNOSIS — R627 Adult failure to thrive: Secondary | ICD-10-CM | POA: Diagnosis present

## 2018-08-19 DIAGNOSIS — J449 Chronic obstructive pulmonary disease, unspecified: Secondary | ICD-10-CM | POA: Diagnosis present

## 2018-08-19 DIAGNOSIS — E1165 Type 2 diabetes mellitus with hyperglycemia: Secondary | ICD-10-CM | POA: Diagnosis present

## 2018-08-19 DIAGNOSIS — Z515 Encounter for palliative care: Secondary | ICD-10-CM

## 2018-08-19 DIAGNOSIS — I110 Hypertensive heart disease with heart failure: Secondary | ICD-10-CM | POA: Diagnosis present

## 2018-08-19 DIAGNOSIS — J9600 Acute respiratory failure, unspecified whether with hypoxia or hypercapnia: Secondary | ICD-10-CM

## 2018-08-19 DIAGNOSIS — R402112 Coma scale, eyes open, never, at arrival to emergency department: Secondary | ICD-10-CM | POA: Diagnosis present

## 2018-08-19 DIAGNOSIS — J189 Pneumonia, unspecified organism: Secondary | ICD-10-CM

## 2018-08-19 DIAGNOSIS — J9690 Respiratory failure, unspecified, unspecified whether with hypoxia or hypercapnia: Secondary | ICD-10-CM

## 2018-08-19 DIAGNOSIS — Z66 Do not resuscitate: Secondary | ICD-10-CM | POA: Diagnosis not present

## 2018-08-19 DIAGNOSIS — N170 Acute kidney failure with tubular necrosis: Secondary | ICD-10-CM | POA: Diagnosis present

## 2018-08-19 DIAGNOSIS — R402342 Coma scale, best motor response, flexion withdrawal, at arrival to emergency department: Secondary | ICD-10-CM | POA: Diagnosis present

## 2018-08-19 DIAGNOSIS — E11649 Type 2 diabetes mellitus with hypoglycemia without coma: Secondary | ICD-10-CM | POA: Diagnosis not present

## 2018-08-19 DIAGNOSIS — A419 Sepsis, unspecified organism: Secondary | ICD-10-CM | POA: Diagnosis not present

## 2018-08-19 DIAGNOSIS — Y848 Other medical procedures as the cause of abnormal reaction of the patient, or of later complication, without mention of misadventure at the time of the procedure: Secondary | ICD-10-CM | POA: Diagnosis present

## 2018-08-19 DIAGNOSIS — Z903 Acquired absence of stomach [part of]: Secondary | ICD-10-CM

## 2018-08-19 DIAGNOSIS — R6521 Severe sepsis with septic shock: Secondary | ICD-10-CM | POA: Diagnosis present

## 2018-08-19 DIAGNOSIS — K567 Ileus, unspecified: Secondary | ICD-10-CM

## 2018-08-19 DIAGNOSIS — J9621 Acute and chronic respiratory failure with hypoxia: Principal | ICD-10-CM | POA: Diagnosis present

## 2018-08-19 DIAGNOSIS — J690 Pneumonitis due to inhalation of food and vomit: Secondary | ICD-10-CM | POA: Diagnosis present

## 2018-08-19 DIAGNOSIS — R402212 Coma scale, best verbal response, none, at arrival to emergency department: Secondary | ICD-10-CM | POA: Diagnosis present

## 2018-08-19 DIAGNOSIS — Z86718 Personal history of other venous thrombosis and embolism: Secondary | ICD-10-CM

## 2018-08-19 DIAGNOSIS — D649 Anemia, unspecified: Secondary | ICD-10-CM | POA: Diagnosis present

## 2018-08-19 DIAGNOSIS — B377 Candidal sepsis: Secondary | ICD-10-CM | POA: Diagnosis present

## 2018-08-19 DIAGNOSIS — E878 Other disorders of electrolyte and fluid balance, not elsewhere classified: Secondary | ICD-10-CM | POA: Diagnosis present

## 2018-08-19 DIAGNOSIS — I5032 Chronic diastolic (congestive) heart failure: Secondary | ICD-10-CM | POA: Diagnosis present

## 2018-08-19 DIAGNOSIS — J9601 Acute respiratory failure with hypoxia: Secondary | ICD-10-CM | POA: Diagnosis present

## 2018-08-19 DIAGNOSIS — E876 Hypokalemia: Secondary | ICD-10-CM | POA: Diagnosis not present

## 2018-08-19 DIAGNOSIS — Z86711 Personal history of pulmonary embolism: Secondary | ICD-10-CM

## 2018-08-19 DIAGNOSIS — I4891 Unspecified atrial fibrillation: Secondary | ICD-10-CM | POA: Diagnosis present

## 2018-08-19 DIAGNOSIS — E870 Hyperosmolality and hypernatremia: Secondary | ICD-10-CM | POA: Diagnosis present

## 2018-08-19 DIAGNOSIS — Z95828 Presence of other vascular implants and grafts: Secondary | ICD-10-CM

## 2018-08-19 DIAGNOSIS — Z7901 Long term (current) use of anticoagulants: Secondary | ICD-10-CM

## 2018-08-19 DIAGNOSIS — Z7189 Other specified counseling: Secondary | ICD-10-CM

## 2018-08-19 DIAGNOSIS — T80211A Bloodstream infection due to central venous catheter, initial encounter: Secondary | ICD-10-CM | POA: Diagnosis present

## 2018-08-19 DIAGNOSIS — B49 Unspecified mycosis: Secondary | ICD-10-CM | POA: Diagnosis present

## 2018-08-19 DIAGNOSIS — C169 Malignant neoplasm of stomach, unspecified: Secondary | ICD-10-CM | POA: Diagnosis present

## 2018-08-19 DIAGNOSIS — Z9289 Personal history of other medical treatment: Secondary | ICD-10-CM

## 2018-08-19 DIAGNOSIS — E440 Moderate protein-calorie malnutrition: Secondary | ICD-10-CM | POA: Diagnosis present

## 2018-08-19 DIAGNOSIS — Z794 Long term (current) use of insulin: Secondary | ICD-10-CM

## 2018-08-19 DIAGNOSIS — I82611 Acute embolism and thrombosis of superficial veins of right upper extremity: Secondary | ICD-10-CM | POA: Diagnosis not present

## 2018-08-19 DIAGNOSIS — R109 Unspecified abdominal pain: Secondary | ICD-10-CM

## 2018-08-19 LAB — COMPREHENSIVE METABOLIC PANEL
ALT: 60 U/L — ABNORMAL HIGH (ref 0–44)
AST: 23 U/L (ref 15–41)
Albumin: 1.5 g/dL — ABNORMAL LOW (ref 3.5–5.0)
Alkaline Phosphatase: 383 U/L — ABNORMAL HIGH (ref 38–126)
Anion gap: 8 (ref 5–15)
BUN: 57 mg/dL — ABNORMAL HIGH (ref 8–23)
CO2: 21 mmol/L — ABNORMAL LOW (ref 22–32)
Calcium: 9.1 mg/dL (ref 8.9–10.3)
Chloride: 112 mmol/L — ABNORMAL HIGH (ref 98–111)
Creatinine, Ser: 1.45 mg/dL — ABNORMAL HIGH (ref 0.61–1.24)
GFR calc Af Amer: 52 mL/min — ABNORMAL LOW (ref >60)
GFR calc non Af Amer: 45 mL/min — ABNORMAL LOW (ref >60)
Glucose, Bld: 259 mg/dL — ABNORMAL HIGH (ref 70–99)
Potassium: 4.7 mmol/L (ref 3.5–5.1)
Sodium: 141 mmol/L (ref 135–145)
Total Bilirubin: 0.3 mg/dL (ref 0.3–1.2)
Total Protein: 7.3 g/dL (ref 6.5–8.1)

## 2018-08-19 LAB — CBC
HCT: 33.2 % — ABNORMAL LOW (ref 39.0–52.0)
Hemoglobin: 9.4 g/dL — ABNORMAL LOW (ref 13.0–17.0)
MCH: 25.2 pg — ABNORMAL LOW (ref 26.0–34.0)
MCHC: 28.3 g/dL — ABNORMAL LOW (ref 30.0–36.0)
MCV: 89 fL (ref 80.0–100.0)
Platelets: 532 10*3/uL — ABNORMAL HIGH (ref 150–400)
RBC: 3.73 MIL/uL — ABNORMAL LOW (ref 4.22–5.81)
RDW: 17.6 % — ABNORMAL HIGH (ref 11.5–15.5)
WBC: 28.8 10*3/uL — ABNORMAL HIGH (ref 4.0–10.5)
nRBC: 0.1 % (ref 0.0–0.2)

## 2018-08-19 LAB — CBG MONITORING, ED: Glucose-Capillary: 260 mg/dL — ABNORMAL HIGH (ref 70–99)

## 2018-08-19 LAB — LACTIC ACID, PLASMA: Lactic Acid, Venous: 1.8 mmol/L (ref 0.5–1.9)

## 2018-08-19 MED ORDER — ACETAMINOPHEN 650 MG RE SUPP
975.0000 mg | Freq: Once | RECTAL | Status: AC
  Administered 2018-08-20: 975 mg via RECTAL
  Filled 2018-08-19: qty 1

## 2018-08-19 MED ORDER — VANCOMYCIN HCL 10 G IV SOLR
1500.0000 mg | Freq: Once | INTRAVENOUS | Status: DC
  Administered 2018-08-19: 1500 mg via INTRAVENOUS
  Filled 2018-08-19: qty 1500

## 2018-08-19 MED ORDER — SODIUM CHLORIDE 0.9 % IV BOLUS (SEPSIS)
1000.0000 mL | Freq: Once | INTRAVENOUS | Status: AC
  Administered 2018-08-19: 23:00:00 1000 mL via INTRAVENOUS

## 2018-08-19 MED ORDER — SODIUM CHLORIDE 0.9 % IV SOLN
2.0000 g | Freq: Once | INTRAVENOUS | Status: AC
  Administered 2018-08-20: 01:00:00 2 g via INTRAVENOUS
  Filled 2018-08-19: qty 2

## 2018-08-19 MED ORDER — SODIUM CHLORIDE 0.9 % IV BOLUS (SEPSIS)
250.0000 mL | Freq: Once | INTRAVENOUS | Status: AC
  Administered 2018-08-20: 250 mL via INTRAVENOUS

## 2018-08-19 MED ORDER — VANCOMYCIN HCL IN DEXTROSE 1-5 GM/200ML-% IV SOLN: 1000.0000 mg | Freq: Once | INTRAVENOUS | Status: DC

## 2018-08-19 MED ORDER — SODIUM CHLORIDE 0.9% FLUSH: 3.0000 mL | Freq: Once | INTRAVENOUS | Status: DC

## 2018-08-19 MED ORDER — VANCOMYCIN HCL 10 G IV SOLR
1500.0000 mg | Freq: Once | INTRAVENOUS | Status: DC
  Filled 2018-08-19: qty 1500

## 2018-08-19 MED ORDER — SODIUM CHLORIDE 0.9 % IV BOLUS (SEPSIS)
1000.0000 mL | Freq: Once | INTRAVENOUS | Status: AC
  Administered 2018-08-19: 1000 mL via INTRAVENOUS

## 2018-08-19 MED ORDER — SODIUM CHLORIDE 0.9 % IV SOLN: 2.0000 g | Freq: Once | INTRAVENOUS | Status: DC

## 2018-08-19 MED ORDER — METRONIDAZOLE IN NACL 5-0.79 MG/ML-% IV SOLN
500.0000 mg | Freq: Once | INTRAVENOUS | Status: AC
  Administered 2018-08-19: 500 mg via INTRAVENOUS
  Filled 2018-08-19: qty 100

## 2018-08-19 NOTE — Progress Notes (Addendum)
Pharmacy Antibiotic Note  Timothy Garcia is a 84 y.o. male admitted on 07/27/2018 with sepsis.  Pharmacy has been consulted for vancomycin and cefepime dosing. He was on zosyn at [facility].  Plan: Cefepime 2gm IV q24 hours Vancomycin 1500 mg X 1 then 1250 mg q36 hours F/u renal function, cultures and clinical course   Weight: 154 lb 5.2 oz (70 kg)  Temp (24hrs), Avg:99.4 F (37.4 C), Min:99.4 F (37.4 C), Max:99.4 F (37.4 C)  Recent Labs  Lab 08/17/2018 2217  WBC 28.8*  CREATININE 1.45*  LATICACIDVEN 1.8    Estimated Creatinine Clearance: 36.7 mL/min (A) (by C-G formula based on SCr of 1.45 mg/dL (H)).    Allergies  Allergen Reactions  . Ace Inhibitors Other (See Comments)    Unable to recall Unable to recall   . Iothalamate Other (See Comments)    Patient does not know if allergic to this; able to receive current IV contrast (Omnipaque 350) without adverse effects      Thank you for allowing pharmacy to be a part of this patient's care.  Excell Seltzer [place] 07/27/2018 11:39 PM

## 2018-08-19 NOTE — ED Provider Notes (Signed)
[name] EMERGENCY DEPARTMENT Provider Note   CSN: [id number] Arrival date & time: 08/23/2018  2208    History   Chief Complaint Chief Complaint  Patient presents with   Altered Mental Status    HPI Timothy Garcia is a 84 y.o. 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  Spoke with Chrys Racer, LPN, who reports that over the last 5 days that the patient developed a worsening productive cough with yellow sputum.  He had a chest x-ray performed 2 days ago that showed recurrent pneumonia and the patient has been receiving IV Zosyn every 6 hours, last dose at 1600 today.  She reports that staff earlier in the day noted that the patient had been taking all of his medications by mouth, talking, and was alert, but was noted to be "sleeping a lot".  She reports that when she went to the patient's room to hang his 10 PM bag of Zosyn that the patient was not responding.  She reports that she place the patient on 4 L nasal cannula and he continued to have SaO2 of 42% that improved to 89% when she put him on a nonrebreather and called EMS.  No fever earlier today.  Nursing staff reports the patient is on 24/7 TPN.   In the ER, the patient is somnolent and only withdrawing from pain with tachypnea and tachycardia.   Patient was most recently discharged on May 26 and had a new PICC line placed on 5/25.  The patient is a FULL CODE.    Level 5 caveat secondary to altered mental status.     The history is provided by the EMS personnel, medical records and the nursing home. The history is limited by the condition of the patient.    Past Medical History:  Diagnosis Date   Anemia    CKD (chronic kidney disease) stage 3, GFR 30-59 ml/min (HCC)    COPD (chronic obstructive  pulmonary disease) (HCC)    Diabetes mellitus without complication ([place])    type 2   Hypertension    Hypoxia    Malignant neoplasm of stomach (HCC)    Stenosis of carotid artery     Patient Active Problem List   Diagnosis Date Noted   Acute hypoxemic respiratory failure ([place]) 08/20/2018   Sepsis ([place]) 07/16/2018   Hyperglycemia 07/16/2018   Benign essential HTN 07/16/2018   AMS (altered mental status) 07/16/2018   COPD with acute exacerbation ([place]) 07/16/2018   CKD (chronic kidney disease) stage 3, GFR 30-59 ml/min ([place]) 07/16/2018   Emphysema of lung ([place]) 07/16/2018   NSVT (nonsustained ventricular tachycardia) ([place]) 07/16/2018    History reviewed. No pertinent surgical history.      Home Medications    Prior to Admission medications   Medication Sig Start Date End Date Taking? Authorizing Provider  acetaminophen (TYLENOL) 325 MG tablet Take 650 mg by mouth every 6 (six) hours as needed for fever.   Yes [provider]  albuterol (ACCUNEB) 1.25 MG/3ML nebulizer solution Take 1 ampule by nebulization every 6 (six) hours as needed for wheezing.   Yes [provider]  Amino Acids-Protein Hydrolys (FEEDING SUPPLEMENT, PRO-STAT SUGAR FREE 64,) LIQD Take 30 mLs by mouth daily.   Yes [provider]  Cholecalciferol (VITAMIN D) 50 MCG (2000 UT)  tablet Take 2,000 Units by mouth daily.   Yes [provider]  docusate sodium (COLACE) 100 MG capsule Take 100 mg by mouth 2 (two) times daily.   Yes [provider]  enoxaparin (LOVENOX) 80 MG/0.8ML injection Inject 80 mg into the skin 2 (two) times a day.   Yes [provider]  Glucagon HCl (GLUCAGON EMERGENCY) 1 MG SOLR Inject 1 mg into the muscle every 2 (two) hours as needed (Hyproglycenmia).   Yes [provider]  hydrALAZINE (APRESOLINE) 10 MG tablet Take 10 mg by mouth every 6 (six) hours.   Yes [provider]  HYDROcodone-acetaminophen  (NORCO/VICODIN) 5-325 MG tablet Take 1 tablet by mouth every 6 (six) hours as needed for moderate pain. 07/21/18  Yes Aline August, MD  insulin aspart (NOVOLOG) 100 UNIT/ML injection Inject 1-9 Units into the skin 3 (three) times daily before meals. 101-150=1U 151-200=2U 201-250=3U 251-300=5U 301-350=7U >350 =9U CALL MD for BS>400 OR<60   Yes [provider]  insulin detemir (LEVEMIR) 100 UNIT/ML injection Inject 32 Units into the skin daily.   Yes [provider]  ipratropium-albuterol (DUONEB) 0.5-2.5 (3) MG/3ML SOLN Take 3 mLs by nebulization 3 (three) times daily.   Yes [provider]  latanoprost (XALATAN) 0.005 % ophthalmic solution Place 1 drop into both eyes at bedtime.   Yes [provider]  lidocaine (LIDODERM) 5 % Place 1 patch onto the skin daily. Remove & Discard patch within 12 hours or as directed by MD   Yes [provider]  metoCLOPramide (REGLAN) 5 MG tablet Take 5 mg by mouth every 6 (six) hours.   Yes [provider]  metoprolol tartrate (LOPRESSOR) 50 MG tablet Take 50 mg by mouth 2 (two) times daily.   Yes [provider]  nystatin (MYCOSTATIN) 100000 UNIT/ML suspension Take 5 mLs by mouth 4 (four) times daily.   Yes [provider]  ondansetron (ZOFRAN) 4 MG tablet Take 4 mg by mouth every 6 (six) hours as needed for nausea or vomiting.   Yes [provider]  pantoprazole (PROTONIX) 20 MG tablet Take 20 mg by mouth 2 (two) times daily.   Yes [provider]  piperacillin-tazobactam (ZOSYN) 3.375 (3-0.375) g injection Inject 3.375 g into the muscle every 6 (six) hours.   Yes [provider]  povidone-Iodine (BETADINE) 5 % SOLN topical solution Apply 1 application topically 2 (two) times a day. To right heel   Yes [provider]  Skin Protectants, Misc. (MINERIN CREME) CREA Apply 1 application topically daily. All extremities for dry skin   Yes [provider]  timolol (TIMOPTIC) 0.5 % ophthalmic solution Place 1 drop into both eyes daily. 12/12/13  Yes [provider]  TPN ADULT Inject 70.8 mLs into the vein every Monday, Wednesday, and Friday. 3-IN-1 With lipids  Over 24 hours in the evening   Yes [provider]  TPN ADULT Inject 70.8 mLs into the vein See admin instructions. Tue , thurs, sat and Sunday (Without  Lipids) over 24 hours  2-IN-1   Yes [provider]  umeclidinium-vilanterol (ANORO ELLIPTA) 62.5-25 MCG/INH AEPB Inhale 1 puff into the lungs daily.   Yes [provider]  insulin glargine (LANTUS) 100 UNIT/ML injection Inject 0.15 mLs (15 Units total) into the skin 2 (two) times daily. Patient not taking: Reported on 08/20/2018 07/21/18   Aline August, MD    Family History History reviewed. No pertinent family history.  Social History Social History  Tobacco Use   Smoking status: Unknown If Ever Smoked  Substance Use Topics   Alcohol use: Not Currently   Drug use: Never     Allergies   Ace inhibitors and Iothalamate   Review of Systems Review of Systems  Unable to perform ROS: Mental status change     Physical Exam Updated Vital Signs BP (!) 99/58 (BP Location: Right Arm)    Pulse (!) 101    Temp (!) 96.8 F (36 C) (Axillary)    Resp 18    Ht '5\' 7"'$  (1.702 m)    Wt 69.7 kg    SpO2 94%    BMI 24.07 kg/m   Physical Exam Constitutional:      General: He is in acute distress.     Appearance: He is ill-appearing.     Comments: Cachetic elderly male  HENT:     Mouth/Throat:     Mouth: Mucous membranes are dry.  Eyes:     Extraocular Movements: Extraocular movements intact.     Pupils: Pupils are equal, round, and reactive to light.  Neck:     Musculoskeletal: Neck supple. No muscular tenderness.  Cardiovascular:     Rate and Rhythm: Tachycardia present.  Pulmonary:     Effort: Tachypnea, accessory muscle usage and respiratory distress present.     Comments:  Rhoncherous breath sounds throughout.  Abdominal:     General: There is no distension.     Palpations: There is no mass.     Tenderness: There is no abdominal tenderness. There is no right CVA tenderness, left CVA tenderness, guarding or rebound.     Hernia: No hernia is present.  Skin:    Comments: Skin is hot to the touch.   Neurological:     GCS: GCS eye subscore is 1. GCS verbal subscore is 1. GCS motor subscore is 4.     Comments: Withdraws from painful stimuli.       ED Treatments / Results  Labs (all labs ordered are listed, but only abnormal results are displayed) Labs Reviewed  COMPREHENSIVE METABOLIC PANEL - Abnormal; Notable for the following components:      Result Value   Chloride 112 (*)    CO2 21 (*)    Glucose, Bld 259 (*)    BUN 57 (*)    Creatinine, Ser 1.45 (*)    Albumin 1.5 (*)    ALT 60 (*)    Alkaline Phosphatase 383 (*)    GFR calc non Af Amer 45 (*)    GFR calc Af Amer 52 (*)    All other components within normal limits  CBC - Abnormal; Notable for the following components:   WBC 28.8 (*)    RBC 3.73 (*)    Hemoglobin 9.4 (*)    HCT 33.2 (*)    MCH 25.2 (*)    MCHC 28.3 (*)    RDW 17.6 (*)    Platelets 532 (*)    All other components within normal limits  CBC - Abnormal; Notable for the following components:   WBC 24.7 (*)    RBC 3.27 (*)    Hemoglobin 8.3 (*)    HCT 28.6 (*)    MCH 25.4 (*)    MCHC 29.0 (*)    RDW 17.5 (*)    All other components within normal limits  BASIC METABOLIC PANEL - Abnormal; Notable for the following components:   Chloride 118 (*)    CO2 17 (*)    BUN  51 (*)    Creatinine, Ser 1.25 (*)    Calcium 7.9 (*)    GFR calc non Af Amer 53 (*)    All other components within normal limits  HEMOGLOBIN A1C - Abnormal; Notable for the following components:   Hgb A1c MFr Bld 10.0 (*)    All other components within normal limits  CBG MONITORING, ED - Abnormal; Notable for the following components:   Glucose-Capillary  260 (*)    All other components within normal limits  POCT I-STAT 7, (LYTES, BLD GAS, ICA,H+H) - Abnormal; Notable for the following components:   pH, Arterial 7.153 (*)    pCO2 arterial 52.7 (*)    pO2, Arterial 175.0 (*)    Bicarbonate 18.5 (*)    TCO2 20 (*)    Acid-base deficit 10.0 (*)    Sodium 147 (*)    HCT 26.0 (*)    Hemoglobin 8.8 (*)    All other components within normal limits  CBG MONITORING, ED - Abnormal; Notable for the following components:   Glucose-Capillary 46 (*)    All other components within normal limits  CBG MONITORING, ED - Abnormal; Notable for the following components:   Glucose-Capillary 117 (*)    All other components within normal limits  SARS CORONAVIRUS 2 (HOSPITAL ORDER, [place] [facility])  CULTURE, BLOOD (ROUTINE X 2)  CULTURE, BLOOD (ROUTINE X 2)  URINE CULTURE  CULTURE, RESPIRATORY  MRSA PCR SCREENING  LACTIC ACID, PLASMA  LACTIC ACID, PLASMA  MAGNESIUM  PHOSPHORUS  GLUCOSE, CAPILLARY  URINALYSIS, ROUTINE W REFLEX MICROSCOPIC  BLOOD GAS, ARTERIAL  LEGIONELLA PNEUMOPHILA SEROGP 1 UR AG  STREP PNEUMONIAE URINARY ANTIGEN  CBC  RENAL FUNCTION PANEL  MAGNESIUM  TRIGLYCERIDES  BLOOD GAS, ARTERIAL    EKG None  Radiology Dg Chest Port 1 View  Result Date: 08/20/2018 CLINICAL DATA:  84 year old male status post intubation. EXAM: PORTABLE CHEST 1 VIEW COMPARISON:  Chest radiograph dated 08/11/2018 FINDINGS: Endotracheal tube with tip approximately 2.5 cm above the carina. Enteric tube extends to the left hemiabdomen with side-port in the region of the gastroesophageal junction and tip beyond the inferior margin of the image, likely in the proximal stomach. Recommend further advancing of the tube by additional 5-7 cm. Left sided PICC with tip in the region of the cavoatrial junction. There is background of emphysema. Bilateral pleural effusions and diffuse interstitial and airspace hazy density, right greater left and  slightly progressed since the prior radiograph. Left lung base density may represent atelectasis or infiltrate. IMPRESSION: 1. Support devices as above. 2. Similar appearance or slight worsening of airspace densities since the prior radiograph. Electronically Signed   By: Anner Crete M.D.   On: 08/20/2018 00:29   Dg Chest Port 1 View  Result Date: 08/11/2018 CLINICAL DATA:  Shortness of breath EXAM: PORTABLE CHEST 1 VIEW COMPARISON:  07/20/2018, 07/17/2018, 07/16/2018, PET CT 04/14/2018 FINDINGS: Left upper extremity catheter tip over the distal SVC. Left greater than right pleural effusions with dense consolidation at left base. Right upper lobe pleural and parenchymal scarring. Slight worsening of diffuse bilateral interstitial and hazy lung opacity. Stable cardiomediastinal silhouette with vascular congestion. No pneumothorax. IMPRESSION: 1. Slight interval worsening of right sided interstitial and hazy pulmonary airspace disease as compared with prior radiographs. 2. Persistent left greater than right pleural effusion with basilar consolidations, atelectasis or pneumonia. Underlying emphysematous disease and pleuroparenchymal scarring in the right apex. Electronically Signed   By: Madie Reno.D.  On: 08/20/2018 23:15    Procedures .Critical Care Performed by: Joanne Gavel, PA-C Authorized by: Joanne Gavel, PA-C   Critical care provider statement:    Critical care time (minutes):  40   Critical care time was exclusive of:  Separately billable procedures and treating other patients and teaching time   Critical care was necessary to treat or prevent imminent or life-threatening deterioration of the following conditions:  Respiratory failure and sepsis   Critical care was time spent personally by me on the following activities:  Ordering and performing treatments and interventions, ordering and review of laboratory studies, ordering and review of radiographic studies, pulse oximetry,  re-evaluation of patient's condition, review of old charts, examination of patient, evaluation of patient's response to treatment and obtaining history from patient or surrogate   (including critical care time)  Medications Ordered in ED Medications  sodium chloride flush (NS) 0.9 % injection 3 mL (3 mLs Intravenous Not Given 08/20/18 0212)  fentaNYL (SUBLIMAZE) injection 25 mcg (has no administration in time range)  fentaNYL (SUBLIMAZE) injection 25-100 mcg (100 mcg Intravenous Given 08/20/18 0434)  midazolam (VERSED) injection 1 mg (has no administration in time range)  midazolam (VERSED) injection 1 mg (1 mg Intravenous Given 08/20/18 0327)  pantoprazole (PROTONIX) injection 40 mg (40 mg Intravenous Given 08/20/18 0255)  0.9 %  sodium chloride infusion ( Intravenous New Bag/Given 08/20/18 0235)  insulin aspart (novoLOG) injection 0-9 Units (0 Units Subcutaneous Not Given 08/20/18 0430)  ipratropium-albuterol (DUONEB) 0.5-2.5 (3) MG/3ML nebulizer solution 3 mL (3 mLs Nebulization Given 08/20/18 0409)  albuterol (PROVENTIL) (2.5 MG/3ML) 0.083% nebulizer solution 2.5 mg (has no administration in time range)  enoxaparin (LOVENOX) injection 80 mg (has no administration in time range)  ceFEPIme (MAXIPIME) 2 g in sodium chloride 0.9 % 100 mL IVPB (has no administration in time range)  vancomycin (VANCOCIN) 1,250 mg in sodium chloride 0.9 % 250 mL IVPB (has no administration in time range)  propofol (DIPRIVAN) 1000 MG/100ML infusion (has no administration in time range)  Chlorhexidine Gluconate Cloth 2 % PADS 6 each (has no administration in time range)  chlorhexidine gluconate (MEDLINE KIT) (PERIDEX) 0.12 % solution 15 mL (has no administration in time range)  MEDLINE mouth rinse (has no administration in time range)  sodium chloride 0.9 % bolus 1,000 mL (0 mLs Intravenous Stopped 08/13/2018 2356)    And  sodium chloride 0.9 % bolus 1,000 mL (0 mLs Intravenous Stopped 08/20/18 0056)    And  sodium  chloride 0.9 % bolus 250 mL (0 mLs Intravenous Stopped 08/20/18 0216)  metroNIDAZOLE (FLAGYL) IVPB 500 mg (0 mg Intravenous Stopped 08/20/18 0056)  acetaminophen (TYLENOL) suppository 975 mg (975 mg Rectal Given 08/20/18 0013)  ceFEPIme (MAXIPIME) 2 g in sodium chloride 0.9 % 100 mL IVPB (0 g Intravenous Stopped 08/20/18 0325)  etomidate (AMIDATE) injection 20 mg (20 mg Intravenous Given by Other 08/21/2018 2331)  rocuronium (ZEMURON) injection 70 mg (70 mg Intravenous Given by Other 08/10/2018 2331)  sodium chloride 0.9 % bolus 1,000 mL (1,000 mLs Intravenous New Bag/Given 08/20/18 0239)  sodium chloride 0.9 % bolus 1,000 mL (0 mLs Intravenous Stopped 08/20/18 0239)  dextrose 50 % solution (  Given 08/20/18 0248)  albumin human 5 % solution 25 g (25 g Intravenous New Bag/Given 08/20/18 0324)     Initial Impression / Assessment and Plan / ED Course  I have reviewed the triage vital signs and the nursing notes.  Pertinent labs & imaging results that were available  during my care of the patient were reviewed by me and considered in my medical decision making (see chart for details).  Clinical Course as of Aug 20 527  Thu Aug 20, 2018  0157 Patient recheck. BP 110/60s with HR in the low 100s. RR improved to 18. He was received 2250 ccs of fluid.  Nursing staff reports no urine output today.  Will order an additional liter of fluids.   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   Patient with rectal temp of 100.9, respiration rate in the 50s, tachycardic in the 130s and blood pressure 90s/50s on arrival.  Patient was not hypoxic as he was on a nonrebreather, but given continued tachypnea with a GCS of 6, the patient was intubated by Dr. Betsey Holiday,  supervising physician.  Please see his note for further procedure details.   Chest x-ray with slightly interval worsening of right sided interstitial and hazy pulmonary airspace disease and persistent left greater than right pleural effusion with basilar consolidations concerning for atelectasis or pneumonia and underlying emphysematous disease and pleural-parenchymal scarring in the right apex.  New leukocytosis of 28.8K. Lactate is normal. Code sepsis was initiated.  Pharmacy was consulted as the patient has been on IV Zosyn q6h for the last 2 days. Last dose 1400 today.  Will change antibiotics to cefepime and vancomycin to cover for aspiration pneumonia.  Blood cultures x2 have been ordered prior to antibiotic administration.   He also has a new AKI with creatinine at 1.45. 30 cc/kg IVF ordered. COVID-19 test is pending.  Hemoglobin is stable from previous at 9.4. ABG is pending.  Patient's emergency contact, his sister, was updated on the patient's plan of care.   Post-intubation X-ray discussed with Dr. Betsey Holiday. No change in ET tube at this time. Patient had fentanyl PRN ordered for sedation, which he has not required.   Consulted critical care team for admission and Dr. Lucile Shutters has accepted the patient for admission. The patient appears reasonably stabilized for admission considering the current resources, flow, and capabilities available in the ED at this time, and I doubt any other [facility] requiring further screening and/or treatment in the ED prior to admission.   Final Clinical Impressions(s) / ED Diagnoses   Final diagnoses:  Septic shock ([place])  Aspiration pneumonia of both lower lobes, unspecified aspiration pneumonia type [facility])    ED Discharge Orders    None       Joanne Gavel, PA-C 08/20/18 0529    Orpah Greek, MD 08/20/18 [phone]

## 2018-08-19 NOTE — ED Triage Notes (Signed)
Pt arrives to ED via EMS from nursing facility. Per EMS pt has had increased altered mental status through out the day as well as increased shortness of breath. Pt is currently on IV abx at nursing facility for pneumonia. Pt's oxygen was in the 80's upon EMS arrival. Pt arrives to ED on non re-breather. Pt's respirations in the 50's. He is tachycardic on monitor in 130's. Minimal response to painful stimuli.

## 2018-08-20 DIAGNOSIS — Z515 Encounter for palliative care: Secondary | ICD-10-CM

## 2018-08-20 DIAGNOSIS — Z7189 Other specified counseling: Secondary | ICD-10-CM

## 2018-08-20 DIAGNOSIS — A419 Sepsis, unspecified organism: Secondary | ICD-10-CM | POA: Diagnosis present

## 2018-08-20 DIAGNOSIS — C169 Malignant neoplasm of stomach, unspecified: Secondary | ICD-10-CM | POA: Diagnosis present

## 2018-08-20 DIAGNOSIS — R402212 Coma scale, best verbal response, none, at arrival to emergency department: Secondary | ICD-10-CM | POA: Diagnosis present

## 2018-08-20 DIAGNOSIS — K567 Ileus, unspecified: Secondary | ICD-10-CM | POA: Diagnosis not present

## 2018-08-20 DIAGNOSIS — E870 Hyperosmolality and hypernatremia: Secondary | ICD-10-CM | POA: Diagnosis present

## 2018-08-20 DIAGNOSIS — I5032 Chronic diastolic (congestive) heart failure: Secondary | ICD-10-CM | POA: Diagnosis present

## 2018-08-20 DIAGNOSIS — T80211A Bloodstream infection due to central venous catheter, initial encounter: Secondary | ICD-10-CM | POA: Diagnosis present

## 2018-08-20 DIAGNOSIS — J449 Chronic obstructive pulmonary disease, unspecified: Secondary | ICD-10-CM | POA: Diagnosis present

## 2018-08-20 DIAGNOSIS — B49 Unspecified mycosis: Secondary | ICD-10-CM | POA: Diagnosis not present

## 2018-08-20 DIAGNOSIS — E440 Moderate protein-calorie malnutrition: Secondary | ICD-10-CM | POA: Diagnosis present

## 2018-08-20 DIAGNOSIS — N170 Acute kidney failure with tubular necrosis: Secondary | ICD-10-CM | POA: Diagnosis present

## 2018-08-20 DIAGNOSIS — I4891 Unspecified atrial fibrillation: Secondary | ICD-10-CM | POA: Diagnosis present

## 2018-08-20 DIAGNOSIS — G9340 Encephalopathy, unspecified: Secondary | ICD-10-CM | POA: Diagnosis not present

## 2018-08-20 DIAGNOSIS — E878 Other disorders of electrolyte and fluid balance, not elsewhere classified: Secondary | ICD-10-CM | POA: Diagnosis present

## 2018-08-20 DIAGNOSIS — B377 Candidal sepsis: Secondary | ICD-10-CM | POA: Diagnosis present

## 2018-08-20 DIAGNOSIS — Z20828 Contact with and (suspected) exposure to other viral communicable diseases: Secondary | ICD-10-CM | POA: Diagnosis present

## 2018-08-20 DIAGNOSIS — R402342 Coma scale, best motor response, flexion withdrawal, at arrival to emergency department: Secondary | ICD-10-CM | POA: Diagnosis present

## 2018-08-20 DIAGNOSIS — Z888 Allergy status to other drugs, medicaments and biological substances status: Secondary | ICD-10-CM | POA: Diagnosis not present

## 2018-08-20 DIAGNOSIS — E119 Type 2 diabetes mellitus without complications: Secondary | ICD-10-CM | POA: Diagnosis not present

## 2018-08-20 DIAGNOSIS — Y848 Other medical procedures as the cause of abnormal reaction of the patient, or of later complication, without mention of misadventure at the time of the procedure: Secondary | ICD-10-CM | POA: Diagnosis present

## 2018-08-20 DIAGNOSIS — B961 Klebsiella pneumoniae [K. pneumoniae] as the cause of diseases classified elsewhere: Secondary | ICD-10-CM | POA: Diagnosis present

## 2018-08-20 DIAGNOSIS — J9601 Acute respiratory failure with hypoxia: Secondary | ICD-10-CM | POA: Diagnosis not present

## 2018-08-20 DIAGNOSIS — M7989 Other specified soft tissue disorders: Secondary | ICD-10-CM | POA: Diagnosis not present

## 2018-08-20 DIAGNOSIS — J9621 Acute and chronic respiratory failure with hypoxia: Secondary | ICD-10-CM | POA: Diagnosis present

## 2018-08-20 DIAGNOSIS — R402112 Coma scale, eyes open, never, at arrival to emergency department: Secondary | ICD-10-CM | POA: Diagnosis present

## 2018-08-20 DIAGNOSIS — R0602 Shortness of breath: Secondary | ICD-10-CM | POA: Diagnosis not present

## 2018-08-20 DIAGNOSIS — Z66 Do not resuscitate: Secondary | ICD-10-CM | POA: Diagnosis not present

## 2018-08-20 DIAGNOSIS — J690 Pneumonitis due to inhalation of food and vomit: Secondary | ICD-10-CM | POA: Diagnosis present

## 2018-08-20 DIAGNOSIS — R6521 Severe sepsis with septic shock: Secondary | ICD-10-CM | POA: Diagnosis not present

## 2018-08-20 DIAGNOSIS — J9600 Acute respiratory failure, unspecified whether with hypoxia or hypercapnia: Secondary | ICD-10-CM | POA: Diagnosis not present

## 2018-08-20 DIAGNOSIS — R109 Unspecified abdominal pain: Secondary | ICD-10-CM | POA: Diagnosis not present

## 2018-08-20 DIAGNOSIS — E876 Hypokalemia: Secondary | ICD-10-CM | POA: Diagnosis not present

## 2018-08-20 DIAGNOSIS — Z1612 Extended spectrum beta lactamase (ESBL) resistance: Secondary | ICD-10-CM | POA: Diagnosis not present

## 2018-08-20 DIAGNOSIS — Z9911 Dependence on respirator [ventilator] status: Secondary | ICD-10-CM | POA: Diagnosis not present

## 2018-08-20 DIAGNOSIS — R627 Adult failure to thrive: Secondary | ICD-10-CM | POA: Diagnosis present

## 2018-08-20 DIAGNOSIS — I82611 Acute embolism and thrombosis of superficial veins of right upper extremity: Secondary | ICD-10-CM | POA: Diagnosis not present

## 2018-08-20 LAB — HEMOGLOBIN A1C
Hgb A1c MFr Bld: 10 % — ABNORMAL HIGH (ref 4.8–5.6)
Mean Plasma Glucose: 240.3 mg/dL

## 2018-08-20 LAB — CBC
HCT: 28.6 % — ABNORMAL LOW (ref 39.0–52.0)
HCT: 25.1 % — ABNORMAL LOW (ref 39.0–52.0)
Hemoglobin: 8.3 g/dL — ABNORMAL LOW (ref 13.0–17.0)
Hemoglobin: 7.3 g/dL — ABNORMAL LOW (ref 13.0–17.0)
MCH: 25.4 pg — ABNORMAL LOW (ref 26.0–34.0)
MCH: 25.4 pg — ABNORMAL LOW (ref 26.0–34.0)
MCHC: 29 g/dL — ABNORMAL LOW (ref 30.0–36.0)
MCHC: 29.1 g/dL — ABNORMAL LOW (ref 30.0–36.0)
MCV: 87.5 fL (ref 80.0–100.0)
MCV: 87.5 fL (ref 80.0–100.0)
Platelets: 399 10*3/uL (ref 150–400)
Platelets: 367 10*3/uL (ref 150–400)
RBC: 3.27 MIL/uL — ABNORMAL LOW (ref 4.22–5.81)
RBC: 2.87 MIL/uL — ABNORMAL LOW (ref 4.22–5.81)
RDW: 17.5 % — ABNORMAL HIGH (ref 11.5–15.5)
RDW: 17.5 % — ABNORMAL HIGH (ref 11.5–15.5)
WBC: 24.7 10*3/uL — ABNORMAL HIGH (ref 4.0–10.5)
WBC: 25.8 10*3/uL — ABNORMAL HIGH (ref 4.0–10.5)
nRBC: 0.2 % (ref 0.0–0.2)
nRBC: 0.1 % (ref 0.0–0.2)

## 2018-08-20 LAB — RENAL FUNCTION PANEL
Albumin: 1.4 g/dL — ABNORMAL LOW (ref 3.5–5.0)
Anion gap: 7 (ref 5–15)
BUN: 48 mg/dL — ABNORMAL HIGH (ref 8–23)
CO2: 16 mmol/L — ABNORMAL LOW (ref 22–32)
Calcium: 7.5 mg/dL — ABNORMAL LOW (ref 8.9–10.3)
Chloride: 121 mmol/L — ABNORMAL HIGH (ref 98–111)
Creatinine, Ser: 1.26 mg/dL — ABNORMAL HIGH (ref 0.61–1.24)
GFR calc Af Amer: >60 mL/min (ref >60)
GFR calc non Af Amer: 53 mL/min — ABNORMAL LOW (ref >60)
Glucose, Bld: 81 mg/dL (ref 70–99)
Phosphorus: 2.8 mg/dL (ref 2.5–4.6)
Potassium: 4.2 mmol/L (ref 3.5–5.1)
Sodium: 144 mmol/L (ref 135–145)

## 2018-08-20 LAB — BASIC METABOLIC PANEL
Anion gap: 7 (ref 5–15)
BUN: 51 mg/dL — ABNORMAL HIGH (ref 8–23)
CO2: 17 mmol/L — ABNORMAL LOW (ref 22–32)
Calcium: 7.9 mg/dL — ABNORMAL LOW (ref 8.9–10.3)
Chloride: 118 mmol/L — ABNORMAL HIGH (ref 98–111)
Creatinine, Ser: 1.25 mg/dL — ABNORMAL HIGH (ref 0.61–1.24)
GFR calc Af Amer: >60 mL/min (ref >60)
GFR calc non Af Amer: 53 mL/min — ABNORMAL LOW (ref >60)
Glucose, Bld: 71 mg/dL (ref 70–99)
Potassium: 4.6 mmol/L (ref 3.5–5.1)
Sodium: 142 mmol/L (ref 135–145)

## 2018-08-20 LAB — POCT I-STAT 7, (LYTES, BLD GAS, ICA,H+H)
Acid-base deficit: 10 mmol/L — ABNORMAL HIGH (ref 0.0–2.0)
Acid-base deficit: 11 mmol/L — ABNORMAL HIGH (ref 0.0–2.0)
Acid-base deficit: 11 mmol/L — ABNORMAL HIGH (ref 0.0–2.0)
Bicarbonate: 18.5 mmol/L — ABNORMAL LOW (ref 20.0–28.0)
Bicarbonate: 14.9 mmol/L — ABNORMAL LOW (ref 20.0–28.0)
Bicarbonate: 14.3 mmol/L — ABNORMAL LOW (ref 20.0–28.0)
Calcium, Ion: 1.27 mmol/L (ref 1.15–1.40)
Calcium, Ion: 1.22 mmol/L (ref 1.15–1.40)
Calcium, Ion: 1.24 mmol/L (ref 1.15–1.40)
HCT: 26 % — ABNORMAL LOW (ref 39.0–52.0)
HCT: 23 % — ABNORMAL LOW (ref 39.0–52.0)
HCT: 23 % — ABNORMAL LOW (ref 39.0–52.0)
Hemoglobin: 8.8 g/dL — ABNORMAL LOW (ref 13.0–17.0)
Hemoglobin: 7.8 g/dL — ABNORMAL LOW (ref 13.0–17.0)
Hemoglobin: 7.8 g/dL — ABNORMAL LOW (ref 13.0–17.0)
O2 Saturation: 99 %
O2 Saturation: 96 %
O2 Saturation: 93 %
Patient temperature: 98.6
Potassium: 4.3 mmol/L (ref 3.5–5.1)
Potassium: 4 mmol/L (ref 3.5–5.1)
Potassium: 4 mmol/L (ref 3.5–5.1)
Sodium: 147 mmol/L — ABNORMAL HIGH (ref 135–145)
Sodium: 148 mmol/L — ABNORMAL HIGH (ref 135–145)
Sodium: 148 mmol/L — ABNORMAL HIGH (ref 135–145)
TCO2: 20 mmol/L — ABNORMAL LOW (ref 22–32)
TCO2: 16 mmol/L — ABNORMAL LOW (ref 22–32)
TCO2: 15 mmol/L — ABNORMAL LOW (ref 22–32)
pCO2 arterial: 52.7 mmHg — ABNORMAL HIGH (ref 32.0–48.0)
pCO2 arterial: 33.5 mmHg (ref 32.0–48.0)
pCO2 arterial: 28.4 mmHg — ABNORMAL LOW (ref 32.0–48.0)
pH, Arterial: 7.153 — CL (ref 7.350–7.450)
pH, Arterial: 7.257 — ABNORMAL LOW (ref 7.350–7.450)
pH, Arterial: 7.31 — ABNORMAL LOW (ref 7.350–7.450)
pO2, Arterial: 175 mmHg — ABNORMAL HIGH (ref 83.0–108.0)
pO2, Arterial: 93 mmHg (ref 83.0–108.0)
pO2, Arterial: 72 mmHg — ABNORMAL LOW (ref 83.0–108.0)

## 2018-08-20 LAB — SARS CORONAVIRUS 2 BY RT PCR (HOSPITAL ORDER, PERFORMED IN ~~LOC~~ HOSPITAL LAB): SARS Coronavirus 2: NEGATIVE

## 2018-08-20 LAB — CBG MONITORING, ED
Glucose-Capillary: 117 mg/dL — ABNORMAL HIGH (ref 70–99)
Glucose-Capillary: 46 mg/dL — ABNORMAL LOW (ref 70–99)

## 2018-08-20 LAB — GLUCOSE, CAPILLARY
Glucose-Capillary: 87 mg/dL (ref 70–99)
Glucose-Capillary: 134 mg/dL — ABNORMAL HIGH (ref 70–99)
Glucose-Capillary: 51 mg/dL — ABNORMAL LOW (ref 70–99)
Glucose-Capillary: 136 mg/dL — ABNORMAL HIGH (ref 70–99)
Glucose-Capillary: 131 mg/dL — ABNORMAL HIGH (ref 70–99)
Glucose-Capillary: 275 mg/dL — ABNORMAL HIGH (ref 70–99)
Glucose-Capillary: 400 mg/dL — ABNORMAL HIGH (ref 70–99)

## 2018-08-20 LAB — URINALYSIS, ROUTINE W REFLEX MICROSCOPIC
Bilirubin Urine: NEGATIVE
Glucose, UA: NEGATIVE mg/dL
Ketones, ur: NEGATIVE mg/dL
Nitrite: NEGATIVE
Protein, ur: 30 mg/dL — AB
Specific Gravity, Urine: 1.017 (ref 1.005–1.030)
pH: 5 (ref 5.0–8.0)

## 2018-08-20 LAB — MRSA PCR SCREENING: MRSA by PCR: NEGATIVE

## 2018-08-20 LAB — PHOSPHORUS: Phosphorus: 3.4 mg/dL (ref 2.5–4.6)

## 2018-08-20 LAB — TRIGLYCERIDES: Triglycerides: 217 mg/dL — ABNORMAL HIGH (ref <150)

## 2018-08-20 LAB — LACTIC ACID, PLASMA: Lactic Acid, Venous: 1.3 mmol/L (ref 0.5–1.9)

## 2018-08-20 LAB — MAGNESIUM
Magnesium: 1.9 mg/dL (ref 1.7–2.4)
Magnesium: 1.7 mg/dL (ref 1.7–2.4)

## 2018-08-20 LAB — STREP PNEUMONIAE URINARY ANTIGEN: Strep Pneumo Urinary Antigen: NEGATIVE

## 2018-08-20 MED ORDER — ALBUTEROL SULFATE (2.5 MG/3ML) 0.083% IN NEBU
2.5000 mg | INHALATION_SOLUTION | RESPIRATORY_TRACT | Status: DC | PRN
  Administered 2018-08-26: 12:00:00 2.5 mg via RESPIRATORY_TRACT
  Filled 2018-08-20: qty 3

## 2018-08-20 MED ORDER — PROPOFOL 1000 MG/100ML IV EMUL: 5.0000 ug/kg/min | INTRAVENOUS | Status: DC

## 2018-08-20 MED ORDER — CHLORHEXIDINE GLUCONATE CLOTH 2 % EX PADS
6.0000 | MEDICATED_PAD | Freq: Every day | CUTANEOUS | Status: DC
  Administered 2018-08-20 – 2018-08-22 (×2): 6 via TOPICAL

## 2018-08-20 MED ORDER — DEXTROSE 50 % IV SOLN
INTRAVENOUS | Status: AC
  Administered 2018-08-20: 50 mL
  Filled 2018-08-20: qty 50

## 2018-08-20 MED ORDER — SODIUM CHLORIDE 0.9 % IV SOLN
2.0000 g | INTRAVENOUS | Status: DC
  Administered 2018-08-21: 2 g via INTRAVENOUS
  Filled 2018-08-20: qty 2

## 2018-08-20 MED ORDER — FENTANYL 2500MCG IN NS 250ML (10MCG/ML) PREMIX INFUSION: 0.0000 ug/h | INTRAVENOUS | Status: DC

## 2018-08-20 MED ORDER — SODIUM CHLORIDE 0.9 % IV BOLUS
1000.0000 mL | Freq: Once | INTRAVENOUS | Status: AC
  Administered 2018-08-20: 1000 mL via INTRAVENOUS

## 2018-08-20 MED ORDER — MIDAZOLAM HCL 2 MG/2ML IJ SOLN
1.0000 mg | INTRAMUSCULAR | Status: DC | PRN
  Filled 2018-08-20: qty 2

## 2018-08-20 MED ORDER — VANCOMYCIN HCL 10 G IV SOLR
1250.0000 mg | INTRAVENOUS | Status: DC
  Filled 2018-08-20: qty 1250

## 2018-08-20 MED ORDER — TRAVASOL 10 % IV SOLN
INTRAVENOUS | Status: DC
  Administered 2018-08-20: 18:00:00 via INTRAVENOUS
  Filled 2018-08-20: qty 900

## 2018-08-20 MED ORDER — ALBUMIN HUMAN 5 % IV SOLN
25.0000 g | Freq: Once | INTRAVENOUS | Status: AC
  Administered 2018-08-20: 25 g via INTRAVENOUS
  Filled 2018-08-20: qty 500

## 2018-08-20 MED ORDER — IPRATROPIUM-ALBUTEROL 0.5-2.5 (3) MG/3ML IN SOLN
3.0000 mL | Freq: Four times a day (QID) | RESPIRATORY_TRACT | Status: DC
  Administered 2018-08-20 – 2018-09-04 (×62): 3 mL via RESPIRATORY_TRACT
  Filled 2018-08-20 (×60): qty 3

## 2018-08-20 MED ORDER — FENTANYL CITRATE (PF) 100 MCG/2ML IJ SOLN
25.0000 ug | INTRAMUSCULAR | Status: DC | PRN
  Administered 2018-08-20 (×5): 100 ug via INTRAVENOUS
  Administered 2018-08-20: 50 ug via INTRAVENOUS
  Administered 2018-08-20: 100 ug via INTRAVENOUS
  Administered 2018-08-21: 75 ug via INTRAVENOUS
  Administered 2018-08-21 – 2018-08-22 (×4): 100 ug via INTRAVENOUS
  Administered 2018-08-22 – 2018-08-23 (×2): 75 ug via INTRAVENOUS
  Administered 2018-08-24 – 2018-08-27 (×8): 100 ug via INTRAVENOUS
  Administered 2018-08-27: 50 ug via INTRAVENOUS
  Administered 2018-08-28 (×2): 100 ug via INTRAVENOUS
  Administered 2018-08-29: 50 ug via INTRAVENOUS
  Administered 2018-08-29 (×2): 100 ug via INTRAVENOUS
  Administered 2018-08-29: 50 ug via INTRAVENOUS
  Administered 2018-08-29 – 2018-08-31 (×7): 100 ug via INTRAVENOUS
  Administered 2018-08-31: 50 ug via INTRAVENOUS
  Administered 2018-08-31 (×2): 100 ug via INTRAVENOUS
  Administered 2018-09-01: 50 ug via INTRAVENOUS
  Administered 2018-09-01: 22:00:00 100 ug via INTRAVENOUS
  Administered 2018-09-01: 25 ug via INTRAVENOUS
  Administered 2018-09-01 – 2018-09-02 (×3): 100 ug via INTRAVENOUS
  Filled 2018-08-20 (×44): qty 2

## 2018-08-20 MED ORDER — ORAL CARE MOUTH RINSE
15.0000 mL | OROMUCOSAL | Status: DC
  Administered 2018-08-20 – 2018-09-04 (×155): 15 mL via OROMUCOSAL

## 2018-08-20 MED ORDER — FENTANYL CITRATE (PF) 100 MCG/2ML IJ SOLN
25.0000 ug | INTRAMUSCULAR | Status: DC | PRN
  Administered 2018-08-22 – 2018-08-31 (×2): 25 ug via INTRAVENOUS
  Filled 2018-08-20 (×2): qty 2

## 2018-08-20 MED ORDER — MIDAZOLAM HCL 2 MG/2ML IJ SOLN
1.0000 mg | INTRAMUSCULAR | Status: DC | PRN
  Administered 2018-08-20: 03:00:00 1 mg via INTRAVENOUS

## 2018-08-20 MED ORDER — DEXTROSE IN LACTATED RINGERS 5 % IV SOLN
INTRAVENOUS | Status: DC
  Administered 2018-08-20 – 2018-08-21 (×3): via INTRAVENOUS

## 2018-08-20 MED ORDER — DEXTROSE 50 % IV SOLN
INTRAVENOUS | Status: AC
  Administered 2018-08-20: 03:00:00
  Filled 2018-08-20: qty 50

## 2018-08-20 MED ORDER — CHLORHEXIDINE GLUCONATE 0.12% ORAL RINSE (MEDLINE KIT)
15.0000 mL | Freq: Two times a day (BID) | OROMUCOSAL | Status: DC
  Administered 2018-08-20 – 2018-09-04 (×31): 15 mL via OROMUCOSAL

## 2018-08-20 MED ORDER — HEPARIN SODIUM (PORCINE) 5000 UNIT/ML IJ SOLN: 5000.0000 [IU] | Freq: Three times a day (TID) | INTRAMUSCULAR | Status: DC

## 2018-08-20 MED ORDER — ENOXAPARIN SODIUM 80 MG/0.8ML ~~LOC~~ SOLN
80.0000 mg | SUBCUTANEOUS | Status: DC
  Administered 2018-08-20 – 2018-08-24 (×5): 80 mg via SUBCUTANEOUS
  Filled 2018-08-20 (×6): qty 0.8

## 2018-08-20 MED ORDER — PANTOPRAZOLE SODIUM 40 MG IV SOLR
40.0000 mg | Freq: Every day | INTRAVENOUS | Status: DC
  Administered 2018-08-20 – 2018-09-04 (×17): 40 mg via INTRAVENOUS
  Filled 2018-08-20 (×17): qty 40

## 2018-08-20 MED ORDER — INSULIN ASPART 100 UNIT/ML ~~LOC~~ SOLN
0.0000 [IU] | SUBCUTANEOUS | Status: DC
  Administered 2018-08-20: 5 [IU] via SUBCUTANEOUS
  Administered 2018-08-20 (×2): 1 [IU] via SUBCUTANEOUS
  Administered 2018-08-20: 9 [IU] via SUBCUTANEOUS

## 2018-08-20 MED ORDER — SODIUM CHLORIDE 0.9 % IV SOLN
INTRAVENOUS | Status: DC
  Administered 2018-08-20: 03:00:00 via INTRAVENOUS

## 2018-08-20 MED ORDER — CHLORHEXIDINE GLUCONATE CLOTH 2 % EX PADS
6.0000 | MEDICATED_PAD | Freq: Every day | CUTANEOUS | Status: DC
  Administered 2018-08-20: 6 via TOPICAL

## 2018-08-20 MED ORDER — ETOMIDATE 2 MG/ML IV SOLN
20.0000 mg | Freq: Once | INTRAVENOUS | Status: AC
  Administered 2018-08-19: 20 mg via INTRAVENOUS

## 2018-08-20 MED ORDER — ROCURONIUM BROMIDE 50 MG/5ML IV SOLN
70.0000 mg | Freq: Once | INTRAVENOUS | Status: AC
  Administered 2018-08-19: 70 mg via INTRAVENOUS

## 2018-08-20 MED ORDER — SODIUM CHLORIDE 0.9% FLUSH: 10.0000 mL | INTRAVENOUS | Status: DC | PRN

## 2018-08-20 NOTE — ED Notes (Signed)
Pt intubated at 2338 with 7.5 ET tube. 24in at the lip.

## 2018-08-20 NOTE — H&P (Addendum)
NAME:  Timothy Garcia, MRN:  [id number], DOB:  [birth year], LOS: 0 ADMISSION DATE:  07/28/2018, CONSULTATION DATE:  08/20/18 REFERRING MD:  Betsey Holiday  CHIEF COMPLAINT:  SOB   Brief History   Timothy Garcia is a 84 y.o. male who resides at SNF and has hx recurrent aspiration, admitted 6/24 with hypoxic respiratory failure presumed due to aspiration.  Required intubation in ED.  History of present illness   Pt is encephelopathic; therefore, this HPI is obtained from chart review. Timothy Garcia is a 84 y.o. 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  He presented to [facility] ED 6/24 with AMS and dyspnea that had worsened throughout the day.  He was apparently on zosyn at SNF for PNA, presumed aspiration.  He had developed cough roughly 5 days prior that had gradually worsened.  On evening of 6/25, he was noted to be hypoxic during nursing rounds.  He was subsequently sent to ED for further evaluation.  In ED, he required intubation for respiratory distress and hypoxia.  CXR demonstrated bilateral opacities R > L with bibasilar atelectasis.  COVID testing negative.  Had recent admission 5/21 through 5/26 for staph epidermidis bacteremia.  Had indwelling PICC removed at that time and new PICC Placed 07/20/18.  Completed course of vanc and zosyn.  Repeat cultures from 5/23 were negative.  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  Significant Hospital Events   6/25 > admit.  Consults:  None.  Procedures:  ETT 6/24 >   Significant Diagnostic Tests:  CXR 6/24 > bilateral opacities R > L with bibasilar atelectasis.  Micro Data:  Blood 6/24 >  Sputum 6/24 >  Urine 6/24 >  U. Strep 6/25 >  U. Legionella 6/25 >  SARS CoV2  6/24 > negative.  Antimicrobials:  Vanc 6/24 >  Cefepime 6/24 >    Interim history/subjective:  Not responsive despite no sedation.  Objective:  Blood pressure (!) 88/51, pulse (!) 104, temperature 99.4 F (37.4 C), temperature source Oral, resp. rate 18, weight 70 kg, SpO2 100 %.    Vent Mode: PRVC FiO2 (%):  [100 %] 100 % Set Rate:  [18 bmp] 18 bmp Vt Set:  [450 mL] 450 mL PEEP:  [5 cmH20] 5 cmH20  No intake or output data in the 24 hours ending 08/20/18 0154 Filed Weights   08/22/2018 2219 08/23/2018 2329  Weight: 73.1 kg 70 kg    Examination: General: Adult male, in NAD. Neuro: Non responsive despite no sedation. HEENT: /AT. Sclerae anicteric.  ETT in place.  MM dry. Cardiovascular: Tachy, regular, no M/R/G.  Lungs: Respirations even and unlabored.  CTA bilaterally, No W/R/R.  Abdomen: BS x 4, soft, NT/ND.  Musculoskeletal: No gross deformities, no edema.  LUE PICC C/D/I. Skin: Intact, warm, no rashes. + skin turgor.  Assessment & Plan:   Acute hypoxic respiratory failure requiring intubation - presumed due to aspiration especially in light of underlying hx of recurrent aspiration. - Full vent support. - Assess ABG. - Wean as able. - Daily SBT. - Bronchial hygiene. - Empiric vanc / cefepime. - Follow cultures, U. Strep, U. Legionella. - Follow CXR.  Hx COPD. - BD's.  Hx DVTs / PE - s/p IVC filter and on  lovenox. - Continue preadmission lovenox at therapeutic dose.  AKI. - Continue fluids. - Follow BMP.  Hx DM. - SSI. - Hold preadmission novolog / levemir.  Hx gastric adenocarcinoma - on chronic TPN. - Hold TPN for now.  Failure to thrive. - Palliative care consult (consult from 5/22 noted; however, repeated admissions warrants revisiting goals of care).    Best Practice:  Diet: NPO. Pain/Anxiety/Delirium protocol (if indicated): Fentanyl PRN / Midazolam PRN.  RASS goal 0 to -1. VAP protocol (if indicated): In place. DVT prophylaxis: SCD's /  Lovenox. GI prophylaxis: PPI. Glucose control: SSI. Mobility: Bedrest. Code Status: Full. Family Communication: None available. Disposition: ICU.  Labs   CBC: Recent Labs  Lab 08/13/2018 2217  WBC 28.8*  HGB 9.4*  HCT 33.2*  MCV 89.0  PLT 948*   Basic Metabolic Panel: Recent Labs  Lab 08/10/2018 2217  NA 141  K 4.7  CL 112*  CO2 21*  GLUCOSE 259*  BUN 57*  CREATININE 1.45*  CALCIUM 9.1   GFR: Estimated Creatinine Clearance: 36.7 mL/min (A) (by C-G formula based on SCr of 1.45 mg/dL (H)). Recent Labs  Lab 07/30/2018 2217  WBC 28.8*  LATICACIDVEN 1.8   Liver Function Tests: Recent Labs  Lab 08/22/2018 2217  AST 23  ALT 60*  ALKPHOS 383*  BILITOT 0.3  PROT 7.3  ALBUMIN 1.5*   No results for input(s): LIPASE, AMYLASE in the last 168 hours. No results for input(s): AMMONIA in the last 168 hours. ABG    Component Value Date/Time   PHART 7.360 07/17/2018 0225   PCO2ART 45.3 07/17/2018 0225   PO2ART 212 (H) 07/17/2018 0225   HCO3 24.9 07/17/2018 0225   TCO2 29 07/16/2018 1435   O2SAT 99.3 07/17/2018 0225    Coagulation Profile: No results for input(s): INR, PROTIME in the last 168 hours. Cardiac Enzymes: No results for input(s): CKTOTAL, CKMB, CKMBINDEX, TROPONINI in the last 168 hours. HbA1C: No results found for: HGBA1C CBG: Recent Labs  Lab 08/13/2018 2217  GLUCAP 260*    Review of Systems:   Unable to obtain as pt is encephalopathic.  Past medical history  He,  has a past medical history of Anemia, CKD (chronic kidney disease) stage 3, GFR 30-59 ml/min (HCC), COPD (chronic obstructive pulmonary disease) ([place]), Diabetes mellitus without complication ([place]), Hypertension, Hypoxia, Malignant neoplasm of stomach ([place]), and Stenosis of carotid artery.   Surgical History   History reviewed. No pertinent surgical history.   Social History   reports previous alcohol use. He reports that he does not use drugs.   Family history   His family history is  not on file.   Allergies Allergies  Allergen Reactions  . Ace Inhibitors Other (See Comments)    Unable to recall Unable to recall   . Iothalamate Other (See Comments)    Patient does not know if allergic to this; able to receive current IV contrast (Omnipaque 350) without adverse effects      Home meds  Prior to Admission medications   Medication Sig Start Date End Date Taking? Authorizing Provider  acetaminophen (TYLENOL) 325 MG tablet Take 650 mg by mouth every 6 (six) hours as needed for fever.   Yes [provider]  albuterol (ACCUNEB) 1.25 MG/3ML nebulizer solution Take 1 ampule by nebulization every 6 (six) hours as needed for wheezing.   Yes [provider]  Amino Acids-Protein Hydrolys (FEEDING SUPPLEMENT, PRO-STAT SUGAR FREE 64,) LIQD Take 30 mLs by mouth daily.   Yes [provider]  Cholecalciferol (VITAMIN D) 50 MCG (2000 UT) tablet Take 2,000 Units by mouth daily.   Yes [provider]  docusate sodium (COLACE) 100 MG capsule Take 100 mg by mouth 2 (two) times daily.   Yes [provider]  enoxaparin (LOVENOX) 80 MG/0.8ML injection Inject 80 mg into the skin 2 (two) times a day.   Yes [provider]  Glucagon HCl (GLUCAGON EMERGENCY) 1 MG SOLR Inject 1 mg into the muscle every 2 (two) hours as needed (Hyproglycenmia).   Yes [provider]  hydrALAZINE (APRESOLINE) 10 MG tablet Take 10 mg by mouth every 6 (six) hours.   Yes [provider]  HYDROcodone-acetaminophen (NORCO/VICODIN) 5-325 MG tablet Take 1 tablet by mouth every 6 (six) hours as needed for moderate pain. 07/21/18  Yes Aline August, MD  insulin aspart (NOVOLOG) 100 UNIT/ML injection Inject 1-9 Units into the skin 3 (three) times daily before meals. 101-150=1U 151-200=2U 201-250=3U 251-300=5U 301-350=7U >350 =9U CALL MD for BS>400 OR<60   Yes [provider]  insulin detemir (LEVEMIR) 100 UNIT/ML injection Inject 32 Units into  the skin daily.   Yes [provider]  ipratropium-albuterol (DUONEB) 0.5-2.5 (3) MG/3ML SOLN Take 3 mLs by nebulization 3 (three) times daily.   Yes [provider]  latanoprost (XALATAN) 0.005 % ophthalmic solution Place 1 drop into both eyes at bedtime.   Yes [provider]  lidocaine (LIDODERM) 5 % Place 1 patch onto the skin daily. Remove & Discard patch within 12 hours or as directed by MD   Yes [provider]  metoCLOPramide (REGLAN) 5 MG tablet Take 5 mg by mouth every 6 (six) hours.   Yes [provider]  metoprolol tartrate (LOPRESSOR) 50 MG tablet Take 50 mg by mouth 2 (two) times daily.   Yes [provider]  nystatin (MYCOSTATIN) 100000 UNIT/ML suspension Take 5 mLs by mouth 4 (four) times daily.   Yes [provider]  ondansetron (ZOFRAN) 4 MG tablet Take 4 mg by mouth every 6 (six) hours as needed for nausea or vomiting.   Yes [provider]  pantoprazole (PROTONIX) 20 MG tablet Take 20 mg by mouth 2 (two) times daily.   Yes [provider]  piperacillin-tazobactam (ZOSYN) 3.375 (3-0.375) g injection Inject 3.375 g into the muscle every 6 (six) hours.   Yes [provider]  povidone-Iodine (BETADINE) 5 % SOLN topical solution Apply 1 application topically 2 (two) times a day. To right heel   Yes [provider]  Skin Protectants, Misc. (MINERIN CREME) CREA Apply 1 application topically daily. All extremities for dry skin   Yes [provider]  timolol (TIMOPTIC) 0.5 % ophthalmic solution Place 1 drop into both eyes daily. 12/12/13  Yes [provider]  TPN ADULT Inject 70.8 mLs into the vein every Monday, Wednesday, and Friday. 3-IN-1 With lipids  Over 24 hours in the evening   Yes [provider]  TPN ADULT Inject 70.8 mLs into the vein See admin instructions. Tue , thurs, sat and Sunday (Without  Lipids) over 24 hours  2-IN-1   Yes [provider]   umeclidinium-vilanterol (ANORO ELLIPTA) 62.5-25 MCG/INH AEPB Inhale 1 puff into the lungs daily.   Yes [provider]  insulin glargine (LANTUS) 100 UNIT/ML injection Inject 0.15 mLs (15 Units total) into the skin 2 (two) times daily. Patient not taking: Reported on 08/20/2018 07/21/18   Aline August, MD    Critical care time: 40 min.  Montey Hora, [facility] Pager: [phone].  If no answer, [phone] 08/20/2018, 1:54 AM  Patient seen and examined, agree with above note.  I dictated the care and orders written for this patient under my direction.  Acute resp failure with hypoxemia , HCAP, aspiration into the airway , recurrent Full vent support COPD Stomach cancer DVT ON LOVENOX Consult palliative care in am  Roxanne Mins, MD [phone]

## 2018-08-20 NOTE — ED Notes (Signed)
ED TO INPATIENT HANDOFF REPORT  ED Nurse Name and Phone #: [phone]ess Name/Age/Gender Timothy Garcia 84 y.o. male Room/Bed: 024C/024C  Code Status   Code Status: Full Code  Home/SNF/Other Skilled nursing facility Intubated } Is this baseline? No   Triage Complete: Triage complete  Chief Complaint AMS,Sepsis    Triage Note Pt arrives to ED via EMS from nursing facility. Per EMS pt has had increased altered mental status through out the day as well as increased shortness of breath. Pt is currently on IV abx at nursing facility for pneumonia. Pt's oxygen was in the 80's upon EMS arrival. Pt arrives to ED on non re-breather. Pt's respirations in the 50's. He is tachycardic on monitor in 130's. Minimal response to painful stimuli.    Allergies Allergies  Allergen Reactions  . Ace Inhibitors Other (See Comments)    Unable to recall Unable to recall   . Iothalamate Other (See Comments)    Patient does not know if allergic to this; able to receive current IV contrast (Omnipaque 350) without adverse effects     Level of Care/Admitting Diagnosis ED Disposition    ED Disposition Condition [place]: [place] [100100]  Level of Care: ICU [6]  Covid Evaluation: Confirmed COVID Negative  Diagnosis: Acute hypoxemic respiratory failure [facility]) [[id number]]  Admitting Physician: Roxanne Mins [[id number]]  Attending Physician: PCCM, MD [id number]  Estimated length of stay: 5 - 7 days  Certification:: I certify this patient will need inpatient services for at least 2 midnights  PT Class (Do Not Modify): Inpatient [101]  PT Acc Code (Do Not Modify): Private [1]       B Medical/Surgery History Past Medical History:  Diagnosis Date  . Anemia   . CKD (chronic kidney disease) stage 3, GFR 30-59 ml/min (HCC)   . COPD (chronic obstructive pulmonary disease) ([place])   . Diabetes mellitus without complication ([place])    type 2  . Hypertension   .  Hypoxia   . Malignant neoplasm of stomach ([place])   . Stenosis of carotid artery    History reviewed. No pertinent surgical history.   A IV Location/Drains/Wounds Patient Lines/Drains/Airways Status   Active Line/Drains/Airways    Name:   Placement date:   Placement time:   Site:   Days:   Peripheral IV 08/21/2018   08/21/2018    2228    -   1   PICC Double Lumen 07/20/18 PICC Left Brachial 44 cm 1 cm   07/20/18    1613     31   NG/OG Tube Orogastric 18 Fr. Left mouth Aucultation   08/20/18    0017    Left mouth   less than 1   External Urinary Catheter   07/19/18    1037    -   32   Airway 7.5 mm   08/18/2018    2338     1   Pressure Injury 07/16/18 Deep Tissue Injury - Purple or maroon localized area of discolored intact skin or blood-filled blister due to damage of underlying soft tissue from pressure and/or shear.   07/16/18    2100     35   Pressure Injury 07/20/18 Deep Tissue Injury - Purple or maroon localized area of discolored intact skin or blood-filled blister due to damage of underlying soft tissue from pressure and/or shear.   07/20/18    0744     31  Intake/Output Last 24 hours No intake or output data in the 24 hours ending 08/20/18 0314  Labs/Imaging Results for orders placed or performed during the hospital encounter of 08/24/2018 (from the past 48 hour(s))  Comprehensive metabolic panel     Status: Abnormal   Collection Time: 08/18/2018 10:17 PM  Result Value Ref Range   Sodium 141 135 - 145 mmol/L   Potassium 4.7 3.5 - 5.1 mmol/L   Chloride 112 (H) 98 - 111 mmol/L   CO2 21 (L) 22 - 32 mmol/L   Glucose, Bld 259 (H) 70 - 99 mg/dL   BUN 57 (H) 8 - 23 mg/dL   Creatinine, Ser 1.45 (H) 0.61 - 1.24 mg/dL   Calcium 9.1 8.9 - 10.3 mg/dL   Total Protein 7.3 6.5 - 8.1 g/dL   Albumin 1.5 (L) 3.5 - 5.0 g/dL   AST 23 15 - 41 U/L   ALT 60 (H) 0 - 44 U/L   Alkaline Phosphatase 383 (H) 38 - 126 U/L   Total Bilirubin 0.3 0.3 - 1.2 mg/dL   GFR calc non Af Amer 45 (L) >60  mL/min   GFR calc Af Amer 52 (L) >60 mL/min   Anion gap 8 5 - 15    Comment: Performed at [facility], [address], [place], [place] [address]  CBC     Status: Abnormal   Collection Time: 08/04/2018 10:17 PM  Result Value Ref Range   WBC 28.8 (H) 4.0 - 10.5 K/uL   RBC 3.73 (L) 4.22 - 5.81 MIL/uL   Hemoglobin 9.4 (L) 13.0 - 17.0 g/dL   HCT 33.2 (L) 39.0 - 52.0 %   MCV 89.0 80.0 - 100.0 fL   MCH 25.2 (L) 26.0 - 34.0 pg   MCHC 28.3 (L) 30.0 - 36.0 g/dL   RDW 17.6 (H) 11.5 - 15.5 %   Platelets 532 (H) 150 - 400 K/uL   nRBC 0.1 0.0 - 0.2 %    Comment: Performed at [facility] [address], [place], [place] [address]  CBG monitoring, ED     Status: Abnormal   Collection Time: 08/14/2018 10:17 PM  Result Value Ref Range   Glucose-Capillary 260 (H) 70 - 99 mg/dL  Lactic acid, plasma     Status: None   Collection Time: 08/18/2018 10:17 PM  Result Value Ref Range   Lactic Acid, Venous 1.8 0.5 - 1.9 mmol/L    Comment: Performed at [facility] [address], [place], [place] [address]  SARS Coronavirus 2 (CEPHEID- Performed in [place] hospital lab), Hosp Order     Status: None   Collection Time: 08/17/2018 10:38 PM   Specimen: Nasopharyngeal Swab  Result Value Ref Range   SARS Coronavirus 2 NEGATIVE NEGATIVE    Comment: (NOTE) If result is NEGATIVE SARS-CoV-2 target nucleic acids are NOT DETECTED. The SARS-CoV-2 RNA is generally detectable in upper and lower  respiratory specimens during the acute phase of infection. The lowest  concentration of SARS-CoV-2 viral copies this assay can detect is 250  copies / mL. A negative result does not preclude SARS-CoV-2 infection  and should not be used as the sole basis for treatment or other  patient management decisions.  A negative result may occur with  improper specimen collection / handling, submission of specimen other  than nasopharyngeal swab, presence of viral mutation(s) within the  areas targeted by this assay,  and inadequate number of viral copies  (<250 copies /  mL). A negative result must be combined with clinical  observations, patient history, and epidemiological information. If result is POSITIVE SARS-CoV-2 target nucleic acids are DETECTED. The SARS-CoV-2 RNA is generally detectable in upper and lower  respiratory specimens dur ing the acute phase of infection.  Positive  results are indicative of active infection with SARS-CoV-2.  Clinical  correlation with patient history and other diagnostic information is  necessary to determine patient infection status.  Positive results do  not rule out bacterial infection or co-infection with other viruses. If result is PRESUMPTIVE POSTIVE SARS-CoV-2 nucleic acids MAY BE PRESENT.   A presumptive positive result was obtained on the submitted specimen  and confirmed on repeat testing.  While 2019 novel coronavirus  (SARS-CoV-2) nucleic acids may be present in the submitted sample  additional confirmatory testing may be necessary for epidemiological  and / or clinical management purposes  to differentiate between  SARS-CoV-2 and other Sarbecovirus currently known to infect humans.  If clinically indicated additional testing with an alternate test  methodology [phone]) is advised. The SARS-CoV-2 RNA is generally  detectable in upper and lower respiratory sp ecimens during the acute  phase of infection. The expected result is Negative. Fact Sheet for Patients:  StrictlyIdeas.no Fact Sheet for Healthcare Providers: BankingDealers.co.za This test is not yet approved or cleared by the [place] FDA and has been authorized for detection and/or diagnosis of SARS-CoV-2 by FDA under an Emergency Use Authorization (EUA).  This EUA will remain in effect (meaning this test can be used) for the duration of the COVID-19 declaration under Section 564(b)(1) of the Act, 21 U.S.C. section 360bbb-3(b)(1), unless  the authorization is terminated or revoked sooner. Performed at [facility], [place] [address], [place], [place] [address]   I-STAT 7, (LYTES, BLD GAS, ICA, H+H)     Status: Abnormal   Collection Time: 08/20/18  2:15 AM  Result Value Ref Range   pH, Arterial 7.153 (LL) 7.350 - 7.450   pCO2 arterial 52.7 (H) 32.0 - 48.0 mmHg   pO2, Arterial 175.0 (H) 83.0 - 108.0 mmHg   Bicarbonate 18.5 (L) 20.0 - 28.0 mmol/L   TCO2 20 (L) 22 - 32 mmol/L   O2 Saturation 99.0 %   Acid-base deficit 10.0 (H) 0.0 - 2.0 mmol/L   Sodium 147 (H) 135 - 145 mmol/L   Potassium 4.3 3.5 - 5.1 mmol/L   Calcium, Ion 1.27 1.15 - 1.40 mmol/L   HCT 26.0 (L) 39.0 - 52.0 %   Hemoglobin 8.8 (L) 13.0 - 17.0 g/dL   Patient temperature 98.6 F    Collection site BRACHIAL ARTERY    Sample type ARTERIAL    Comment NOTIFIED PHYSICIAN   Lactic acid, plasma     Status: None   Collection Time: 08/20/18  2:33 AM  Result Value Ref Range   Lactic Acid, Venous 1.3 0.5 - 1.9 mmol/L    Comment: Performed at [facility], [place] [address] [place], [place] [address]  CBC     Status: Abnormal   Collection Time: 08/20/18  2:33 AM  Result Value Ref Range   WBC 24.7 (H) 4.0 - 10.5 K/uL   RBC 3.27 (L) 4.22 - 5.81 MIL/uL   Hemoglobin 8.3 (L) 13.0 - 17.0 g/dL   HCT 28.6 (L) 39.0 - 52.0 %   MCV 87.5 80.0 - 100.0 fL   MCH 25.4 (L) 26.0 - 34.0 pg   MCHC 29.0 (L) 30.0 - 36.0 g/dL   RDW 17.5 (  H) 11.5 - 15.5 %   Platelets 399 150 - 400 K/uL   nRBC 0.2 0.0 - 0.2 %    Comment: Performed at [facility], [place] [address], [place], [place] [address]  Basic metabolic panel     Status: Abnormal   Collection Time: 08/20/18  2:33 AM  Result Value Ref Range   Sodium 142 135 - 145 mmol/L   Potassium 4.6 3.5 - 5.1 mmol/L   Chloride 118 (H) 98 - 111 mmol/L   CO2 17 (L) 22 - 32 mmol/L   Glucose, Bld 71 70 - 99 mg/dL   BUN 51 (H) 8 - 23 mg/dL   Creatinine, Ser 1.25 (H) 0.61 - 1.24 mg/dL   Calcium 7.9 (L) 8.9 - 10.3 mg/dL   GFR calc  non Af Amer 53 (L) >60 mL/min   GFR calc Af Amer >60 >60 mL/min   Anion gap 7 5 - 15    Comment: Performed at [facility] [address], [place], [place] [address]  Magnesium     Status: None   Collection Time: 08/20/18  2:33 AM  Result Value Ref Range   Magnesium 1.9 1.7 - 2.4 mg/dL    Comment: Performed at [facility], [place] [address], [place], [place] [address]  Phosphorus     Status: None   Collection Time: 08/20/18  2:33 AM  Result Value Ref Range   Phosphorus 3.4 2.5 - 4.6 mg/dL    Comment: Performed at [facility], [place] [address] [place], [place] [address]  Hemoglobin A1c     Status: Abnormal   Collection Time: 08/20/18  2:33 AM  Result Value Ref Range   Hgb A1c MFr Bld 10.0 (H) 4.8 - 5.6 %    Comment: (NOTE) Pre diabetes:          5.7%-6.4% Diabetes:              >6.4% Glycemic control for   <7.0% adults with diabetes    Mean Plasma Glucose 240.3 mg/dL    Comment: Performed at [facility] [address] [place], [place] [address]  CBG monitoring, ED     Status: Abnormal   Collection Time: 08/20/18  2:43 AM  Result Value Ref Range   Glucose-Capillary 46 (L) 70 - 99 mg/dL   Dg Chest Port 1 View  Result Date: 08/20/2018 CLINICAL DATA:  84 year old male status post intubation. EXAM: PORTABLE CHEST 1 VIEW COMPARISON:  Chest radiograph dated 08/24/2018 FINDINGS: Endotracheal tube with tip approximately 2.5 cm above the carina. Enteric tube extends to the left hemiabdomen with side-port in the region of the gastroesophageal junction and tip beyond the inferior margin of the image, likely in the proximal stomach. Recommend further advancing of the tube by additional 5-7 cm. Left sided PICC with tip in the region of the cavoatrial junction. There is background of emphysema. Bilateral pleural effusions and diffuse interstitial and airspace hazy density, right greater left and slightly progressed since the prior radiograph. Left lung base density may  represent atelectasis or infiltrate. IMPRESSION: 1. Support devices as above. 2. Similar appearance or slight worsening of airspace densities since the prior radiograph. Electronically Signed   By: Anner Crete M.D.   On: 08/20/2018 00:29   Dg Chest Port 1 View  Result Date: 08/15/2018 CLINICAL DATA:  Shortness of breath EXAM: PORTABLE CHEST 1 VIEW COMPARISON:  07/20/2018, 07/17/2018, 07/16/2018, PET CT 04/14/2018 FINDINGS: Left upper extremity catheter tip over  the distal SVC. Left greater than right pleural effusions with dense consolidation at left base. Right upper lobe pleural and parenchymal scarring. Slight worsening of diffuse bilateral interstitial and hazy lung opacity. Stable cardiomediastinal silhouette with vascular congestion. No pneumothorax. IMPRESSION: 1. Slight interval worsening of right sided interstitial and hazy pulmonary airspace disease as compared with prior radiographs. 2. Persistent left greater than right pleural effusion with basilar consolidations, atelectasis or pneumonia. Underlying emphysematous disease and pleuroparenchymal scarring in the right apex. Electronically Signed   By: [place]avan Foil M.D.   On: 08/18/2018 23:15    Pending Labs Unresulted Labs (From admission, onward)    Start     Ordered   08/20/18 0700  CBC  Once-Timed,   STAT     08/20/18 0252   08/20/18 0700  Renal function panel  Once,   STAT     08/20/18 0252   08/20/18 0700  Magnesium  Once,   STAT     08/20/18 0252   08/20/18 0258  Triglycerides  (propofol (DIPRIVAN) infusion)  Every 72 hours,   R (with STAT occurrences)    Comments: while on propofol (DIPRIVAN)    08/20/18 0257   08/20/18 0153  Legionella Pneumophila Serogp 1 Ur Ag  Once,   STAT     08/20/18 0154   08/20/18 0153  Strep pneumoniae urinary antigen  (not at [facility])  Once,   STAT     08/20/18 0154   08/20/18 0152  Culture, respiratory (tracheal aspirate)  Once,   STAT     08/20/18 0154   08/20/18 0151  Blood gas, arterial   ONCE - STAT,   R    Comments: Per protocol patient intubated    08/20/18 0153   08/17/2018 2236  Blood Culture (routine x 2)  BLOOD CULTURE X 2,   STAT     08/06/2018 2236   08/18/2018 2236  Urinalysis, Routine w reflex microscopic  ONCE - STAT,   STAT     08/10/2018 2236   08/21/2018 2236  Urine culture  ONCE - STAT,   STAT     08/17/2018 2236          Vitals/Pain Today's Vitals   08/20/18 0200 08/20/18 0210 08/20/18 0220 08/20/18 0232  BP: (!) 92/54 (!) 86/60 (!) 90/56 (!) 80/53  Pulse: (!) 102 99 99   Resp: 18 18 (!) 24 (!) 24  Temp:      TempSrc:      SpO2: 100% 100% 100% 100%  Weight:        Isolation Precautions No active isolations  Medications Medications  sodium chloride flush (NS) 0.9 % injection 3 mL (3 mLs Intravenous Not Given 08/20/18 0212)  fentaNYL (SUBLIMAZE) injection 25 mcg (has no administration in time range)  fentaNYL (SUBLIMAZE) injection 25-100 mcg (100 mcg Intravenous Given 08/20/18 0230)  midazolam (VERSED) injection 1 mg (has no administration in time range)  midazolam (VERSED) injection 1 mg (has no administration in time range)  pantoprazole (PROTONIX) injection 40 mg (40 mg Intravenous Given 08/20/18 0255)  0.9 %  sodium chloride infusion ( Intravenous New Bag/Given 08/20/18 0235)  insulin aspart (novoLOG) injection 0-9 Units (0 Units Subcutaneous Not Given 08/20/18 0249)  ipratropium-albuterol (DUONEB) 0.5-2.5 (3) MG/3ML nebulizer solution 3 mL (has no administration in time range)  albuterol (PROVENTIL) (2.5 MG/3ML) 0.083% nebulizer solution 2.5 mg (has no administration in time range)  enoxaparin (LOVENOX) injection 80 mg (has no administration in time range)  ceFEPIme (MAXIPIME) 2 g in  sodium chloride 0.9 % 100 mL IVPB (has no administration in time range)  vancomycin (VANCOCIN) 1,250 mg in sodium chloride 0.9 % 250 mL IVPB (has no administration in time range)  propofol (DIPRIVAN) 1000 MG/100ML infusion (has no administration in time range)  albumin  human 5 % solution 25 g (has no administration in time range)  sodium chloride 0.9 % bolus 1,000 mL (0 mLs Intravenous Stopped 08/22/2018 2356)    And  sodium chloride 0.9 % bolus 1,000 mL (0 mLs Intravenous Stopped 08/20/18 0056)    And  sodium chloride 0.9 % bolus 250 mL (0 mLs Intravenous Stopped 08/20/18 0216)  metroNIDAZOLE (FLAGYL) IVPB 500 mg (0 mg Intravenous Stopped 08/20/18 0056)  acetaminophen (TYLENOL) suppository 975 mg (975 mg Rectal Given 08/20/18 0013)  ceFEPIme (MAXIPIME) 2 g in sodium chloride 0.9 % 100 mL IVPB (2 g Intravenous New Bag/Given 08/20/18 0103)  etomidate (AMIDATE) injection 20 mg (20 mg Intravenous Given by Other 08/08/2018 2331)  rocuronium (ZEMURON) injection 70 mg (70 mg Intravenous Given by Other 08/01/2018 2331)  sodium chloride 0.9 % bolus 1,000 mL (1,000 mLs Intravenous New Bag/Given 08/20/18 0239)  sodium chloride 0.9 % bolus 1,000 mL (0 mLs Intravenous Stopped 08/20/18 0239)  dextrose 50 % solution (  Given 08/20/18 0248)    Mobility non-ambulatory High fall risk   Focused Assessments Pulmonary Assessment Handoff:  Lung sounds: Bilateral Breath Sounds: Clear, Diminished L Breath Sounds: Rhonchi R Breath Sounds: Rhonchi O2 Device: Ventilator O2 Flow Rate (L/min): 15 L/min      R Recommendations: See Admitting Provider Note  Report given to:   Additional Notes:

## 2018-08-20 NOTE — Consult Note (Signed)
Consultation Note Date: 08/20/2018   Patient Name: Timothy Garcia  DOB: 26-Apr-1934  MRN: [id number]  Age / Sex: 84 y.o., male  PCP: Rogers Blocker, MD Referring Physician: Roxanne Mins, MD  Reason for Consultation: Establishing goals of care  HPI/Patient Profile: 84 y.o. male     admitted on 08/18/2018    Clinical Assessment and Goals of Care: Timothy Garcia is a 84 y.o. male who resides at [facility] and has hx recurrent aspiration, admitted 6/24 with hypoxic respiratory failure presumed due to aspiration.  Required intubation in ED. 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  The patient remains intubated, PMT consult for ongoing goals of care discussions.   The patient is intubated, resting in bed. He tries to open his eyes or move about his extremities when his name is called. Discussed with bedside RN.   I placed a call and discussed with the patient's sister ms [name] Crawford. I introduced palliative care as follows: Palliative medicine is specialized medical care for people living with serious illness. It focuses on providing relief from the symptoms and stress of a serious illness. The goal is to improve quality of life for both the patient and the family.  Goals of care: Broad aims of medical therapy in relation to the patient's values and preferences. Our aim is to provide medical care aimed at enabling patients to achieve the goals that matter most to them, given the circumstances of their particular medical situation and their constraints.   Ms Sharlet Salina states that she is constantly in touch with the patient's children who are in [place]. They are aware of the patient's current condition.   Ms Sharlet Salina states that as far as she understands it,  the patient's cancer was removed completely. She was hopeful that the patient's condition would improve post surgery and that he would participate in rehab, build his strength back up etc. How ever, the patient has had gradual progressive functional decline in the past few months, he hasn't been eating well at all, has been on TPN. Ms Sharlet Salina has asked about aspiration pneumonias and I discussed with her in detail.   Discussed with her about needing to discuss further goals of care, she will reach out to the patient's children. PMT to continue to follow, please note additional discussions below.     NEXT OF KIN Patient's sister  [name] lives locally and states that she has helped him make decisions and is his primary caregiver.  Patient reportedly has 3 kids who live in [place]. Ms. Sharlet Salina states that she has notified his children about this current hospitalization.   SUMMARY OF RECOMMENDATIONS    full code, full scope for now, as per initial discussions with sister, patient's reported designated HCPOA agent, there is no official Presenter, broadcasting, according to Ms Sharlet Salina. Will attempt to notify majority of reasonably available patient's children for further  goals of care discussions. Patient's sister endorses that the patient reportedly had elected for full code status in the nursing home. She is able to how ever recall that the patient has had ongoing gradual progressive decline, declining PO intake, chronic TPN dependent at the SNF. We reviewed about the patient's current condition and necessity of establishing next steps in his overall goals of care.  PMT to follow.   Code Status/Advance Care Planning:  Full code    Symptom Management:    continue current mode of care.   Palliative Prophylaxis:   Delirium Protocol  Additional Recommendations (Limitations, Scope, Preferences):  Full Scope Treatment  Psycho-social/Spiritual:   Desire for further Chaplaincy  support:yes  Additional Recommendations: Caregiving  Support/Resources  Prognosis:   Unable to determine  Discharge Planning: To Be Determined      Primary Diagnoses: Present on Admission: . Acute hypoxemic respiratory failure ([place])   I have reviewed the medical record, interviewed the patient and family, and examined the patient. The following aspects are pertinent.  Past Medical History:  Diagnosis Date  . Anemia   . CKD (chronic kidney disease) stage 3, GFR 30-59 ml/min (HCC)   . COPD (chronic obstructive pulmonary disease) ([place])   . Diabetes mellitus without complication ([place])    type 2  . Hypertension   . Hypoxia   . Malignant neoplasm of stomach ([place])   . Stenosis of carotid artery    Social History   Socioeconomic History  . Marital status: Divorced    Spouse name: Not on file  . Number of children: Not on file  . Years of education: Not on file  . Highest education level: Not on file  Occupational History  . Not on file  Social Needs  . Financial resource strain: Not on file  . Food insecurity    Worry: Not on file    Inability: Not on file  . Transportation needs    Medical: Not on file    Non-medical: Not on file  Tobacco Use  . Smoking status: Unknown If Ever Smoked  Substance and Sexual Activity  . Alcohol use: Not Currently  . Drug use: Never  . Sexual activity: Not on file  Lifestyle  . Physical activity    Days per week: Not on file    Minutes per session: Not on file  . Stress: Not on file  Relationships  . Social Herbalist on phone: Not on file    Gets together: Not on file    Attends religious service: Not on file    Active member of club or organization: Not on file    Attends meetings of clubs or organizations: Not on file    Relationship status: Not on file  Other Topics Concern  . Not on file  Social History Narrative  . Not on file   History reviewed. No pertinent family history. Scheduled Meds: .  chlorhexidine gluconate (MEDLINE KIT)  15 mL Mouth Rinse BID  . Chlorhexidine Gluconate Cloth  6 each Topical Daily  . Chlorhexidine Gluconate Cloth  6 each Topical Daily  . enoxaparin (LOVENOX) injection  80 mg Subcutaneous Q24H  . insulin aspart  0-9 Units Subcutaneous Q4H  . ipratropium-albuterol  3 mL Nebulization Q6H  . mouth rinse  15 mL Mouth Rinse 10 times per day  . pantoprazole (PROTONIX) IV  40 mg Intravenous QHS  . sodium chloride flush  3 mL Intravenous Once   Continuous Infusions: . [START ON 08/21/2018]  ceFEPime (MAXIPIME) IV    . dextrose 5% lactated ringers 75 mL/hr at 08/20/18 0918  . TPN ADULT (ION)    . [START ON 08/21/2018] vancomycin     PRN Meds:.albuterol, fentaNYL (SUBLIMAZE) injection, fentaNYL (SUBLIMAZE) injection, sodium chloride flush Medications Prior to Admission:  Prior to Admission medications   Medication Sig Start Date End Date Taking? Authorizing Provider  acetaminophen (TYLENOL) 325 MG tablet Take 650 mg by mouth every 6 (six) hours as needed for fever.   Yes [provider]  albuterol (ACCUNEB) 1.25 MG/3ML nebulizer solution Take 1 ampule by nebulization every 6 (six) hours as needed for wheezing.   Yes [provider]  Amino Acids-Protein Hydrolys (FEEDING SUPPLEMENT, PRO-STAT SUGAR FREE 64,) LIQD Take 30 mLs by mouth daily.   Yes [provider]  Cholecalciferol (VITAMIN D) 50 MCG (2000 UT) tablet Take 2,000 Units by mouth daily.   Yes [provider]  docusate sodium (COLACE) 100 MG capsule Take 100 mg by mouth 2 (two) times daily.   Yes [provider]  enoxaparin (LOVENOX) 80 MG/0.8ML injection Inject 80 mg into the skin 2 (two) times a day.   Yes [provider]  Glucagon HCl (GLUCAGON EMERGENCY) 1 MG SOLR Inject 1 mg into the muscle every 2 (two) hours as needed (Hyproglycenmia).   Yes [provider]  hydrALAZINE (APRESOLINE) 10 MG tablet Take 10 mg by mouth every 6 (six) hours.    Yes [provider]  HYDROcodone-acetaminophen (NORCO/VICODIN) 5-325 MG tablet Take 1 tablet by mouth every 6 (six) hours as needed for moderate pain. 07/21/18  Yes Aline August, MD  insulin aspart (NOVOLOG) 100 UNIT/ML injection Inject 1-9 Units into the skin 3 (three) times daily before meals. 101-150=1U 151-200=2U 201-250=3U 251-300=5U 301-350=7U >350 =9U CALL MD for BS>400 OR<60   Yes [provider]  insulin detemir (LEVEMIR) 100 UNIT/ML injection Inject 32 Units into the skin daily.   Yes [provider]  ipratropium-albuterol (DUONEB) 0.5-2.5 (3) MG/3ML SOLN Take 3 mLs by nebulization 3 (three) times daily.   Yes [provider]  latanoprost (XALATAN) 0.005 % ophthalmic solution Place 1 drop into both eyes at bedtime.   Yes [provider]  lidocaine (LIDODERM) 5 % Place 1 patch onto the skin daily. Remove & Discard patch within 12 hours or as directed by MD   Yes [provider]  metoCLOPramide (REGLAN) 5 MG tablet Take 5 mg by mouth every 6 (six) hours.   Yes [provider]  metoprolol tartrate (LOPRESSOR) 50 MG tablet Take 50 mg by mouth 2 (two) times daily.   Yes [provider]  nystatin (MYCOSTATIN) 100000 UNIT/ML suspension Take 5 mLs by mouth 4 (four) times daily.   Yes [provider]  ondansetron (ZOFRAN) 4 MG tablet Take 4 mg by mouth every 6 (six) hours as needed for nausea or vomiting.   Yes [provider]  pantoprazole (PROTONIX) 20 MG tablet Take 20 mg by mouth 2 (two) times daily.   Yes [provider]  piperacillin-tazobactam (ZOSYN) 3.375 (3-0.375) g injection Inject 3.375 g into the muscle every 6 (six) hours.   Yes [provider]  povidone-Iodine (BETADINE) 5 % SOLN topical solution Apply 1 application topically 2 (two) times a day. To right heel   Yes [provider]  Skin Protectants, Misc. (MINERIN CREME) CREA Apply 1 application topically daily.  All extremities for dry skin   Yes [provider]  timolol (TIMOPTIC) 0.5 % ophthalmic  solution Place 1 drop into both eyes daily. 12/12/13  Yes [provider]  TPN ADULT Inject 70.8 mLs into the vein every Monday, Wednesday, and Friday. 3-IN-1 With lipids  Over 24 hours in the evening   Yes [provider]  TPN ADULT Inject 70.8 mLs into the vein See admin instructions. Tue , thurs, sat and Sunday (Without  Lipids) over 24 hours  2-IN-1   Yes [provider]  umeclidinium-vilanterol (ANORO ELLIPTA) 62.5-25 MCG/INH AEPB Inhale 1 puff into the lungs daily.   Yes [provider]  insulin glargine (LANTUS) 100 UNIT/ML injection Inject 0.15 mLs (15 Units total) into the skin 2 (two) times daily. Patient not taking: Reported on 08/20/2018 07/21/18   Aline August, MD   Allergies  Allergen Reactions  . Ace Inhibitors Other (See Comments)    Unable to recall Unable to recall   . Iothalamate Other (See Comments)    Patient does not know if allergic to this; able to receive current IV contrast (Omnipaque 350) without adverse effects    Review of Systems Non verbal, on the vent  Physical Exam Appears chronically ill Intubated and mechanically ventilated Recently received PRN pain medication Coarse breath sounds anteriorly Irregular Abdomen not distended Has edema LE Patient doesn't arouse to my voice, it is noted that earlier today, the patient was trying to open his eyes and followed commands  Vital Signs: BP (!) 104/56   Pulse 91   Temp 98.2 F (36.8 C) (Axillary)   Resp (!) 25   Ht _0  (1.702 m)   Wt 69.7 kg   SpO2 100%   BMI 24.07 kg/m  Pain Scale: CPOT       SpO2: SpO2: 100 % O2 Device:SpO2: 100 % O2 Flow Rate: .O2 Flow Rate (L/min): 15 L/min  IO: Intake/output summary:   Intake/Output Summary (Last 24 hours) at 08/20/2018 1502 Last data filed at 08/20/2018 1400 Gross per 24 hour  Intake 1272.23 ml  Output 550 ml  Net  722.23 ml    LBM:   Baseline Weight: Weight: 73.1 kg Most recent weight: Weight: 69.7 kg     Palliative Assessment/Data:   Flowsheet Rows     Most Recent Value  Intake Tab  Referral Department  Critical care  Unit at Time of Referral  ICU  Palliative Care Primary Diagnosis  Cancer  Palliative Care Type  New Palliative care  Reason for referral  Clarify Goals of Care  Date first seen by Palliative Care  08/20/18  Clinical Assessment  Palliative Performance Scale Score  20%  Pain Max last 24 hours  4  Pain Min Last 24 hours  3  Dyspnea Max Last 24 Hours  4  Dyspnea Min Last 24 hours  3  Psychosocial & Spiritual Assessment  Palliative Care Outcomes      Time In:  1400 Time Out:  1500 Time Total:  60 min  Greater than 50%  of this time was spent counseling and coordinating care related to the above assessment and plan.  Signed by: Loistine Chance, MD [phone]  Please contact Palliative Medicine Team phone at [phone] for questions and concerns.  For individual provider: See Shea Evans

## 2018-08-20 NOTE — Progress Notes (Signed)
eLink Physician-Brief Progress Note Patient Name: Blong Busk DOB: Aug 26, 1934 MRN: [id number]   Date of Service  08/20/2018  HPI/Events of Note  Acid-base disturbance exacerbated by a component of respiratory acidosis  eICU Interventions  Respiratory rate increased to 28 per minute, ABG at 6 AM        Brandis Matsuura U Ellieana Dolecki 08/20/2018, 4:47 AM

## 2018-08-20 NOTE — Progress Notes (Signed)
Transported patient from ED to 4N27 without event.

## 2018-08-20 NOTE — Progress Notes (Addendum)
Inpatient Diabetes Program Recommendations  AACE/ADA: New Consensus Statement on Inpatient Glycemic Control (2015)  Target Ranges:  Prepandial:   less than 140 mg/dL      Peak postprandial:   less than 180 mg/dL (1-2 hours)      Critically ill patients:  140 - 180 mg/dL   Lab Results  Component Value Date   GLUCAP 134 (H) 08/20/2018   HGBA1C 10.0 (H) 08/20/2018    Review of Glycemic Control Results for MCGUIRE, GASPARYAN (MRN [id number]) as of 08/20/2018 10:20  Ref. Range 08/20/2018 02:43 08/20/2018 03:18 08/20/2018 04:30 08/20/2018 07:44  Glucose-Capillary Latest Ref Range: 70 - 99 mg/dL 46 (L) 117 (H) 87 51 (L)   Diabetes history: Type 2 DM Outpatient Diabetes medications: Novolog 1-9 units TID, Levemir 32 units QD, TPN Current orders for Inpatient glycemic control: Novolog 0-9 units Q4H  Inpatient Diabetes Program Recommendations:    Noted patient experiencing hypoglycemia. Has not received insulin since admission. D5 added to [facility]. In agreement.  As it becomes appropriate, consider switching correction to ICU order set- Novolog 1-3 units Q4H.   Will continue to follow.   Thanks, Bronson Curb, MSN, RNC-OB Diabetes Coordinator [phone] (8a-5p)

## 2018-08-20 NOTE — Progress Notes (Addendum)
NAME:  Timothy Garcia, MRN:  [id number], DOB:  Aug 03, 1934, LOS: 0 ADMISSION DATE:  08/18/2018, CONSULTATION DATE:  08/20/18 REFERRING MD:  Betsey Holiday  CHIEF COMPLAINT:  SOB   Brief History   Timothy Garcia is a 84 y.o. male who resides at SNF and has hx recurrent aspiration, admitted 6/24 with hypoxic respiratory failure presumed due to aspiration.  Required intubation in ED.   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  Significant Hospital Events   6/25 > admit.  Intubated, culture sent, IV fluids started, antibiotics initiated  Consults:  None.  Procedures:  ETT 6/24 >   Significant Diagnostic Tests:  CXR 6/24 > bilateral opacities R > L with bibasilar atelectasis.  Micro Data:  Blood 6/24 >  Sputum 6/24 >  Urine 6/24 >  U. Strep 6/25 > negative U. Legionella 6/25 >  SARS CoV2 6/24 > negative.  Antimicrobials:  Vanc 6/24 >  Cefepime 6/24 >    Interim history/subjective:  More awake, hemodynamically stable  Objective:  Blood pressure (Abnormal) 105/49, pulse 100, temperature (Abnormal) 96.8 F (36 C), temperature source Axillary, resp. rate (Abnormal) 28, height 5\' 7"  (1.702 m), weight 69.7 kg, SpO2 99 %.    Vent Mode: PRVC FiO2 (%):  [50 %-100 %] 50 % Set Rate:  [18 bmp-28 bmp] 28 bmp Vt Set:  [450 mL-520 mL] 520 mL PEEP:  [5 cmH20-8 cmH20] 8 cmH20 Plateau Pressure:  [18 cmH20-22 cmH20] 18 cmH20   Intake/Output Summary (Last 24 hours) at 08/20/2018 0851 Last data filed at 08/20/2018 0800 Gross per 24 hour  Intake 792.95 ml  Output 550 ml  Net 242.95 ml   Filed Weights   08/02/2018 2219 08/03/2018 2329 08/20/18 0408  Weight: 73.1 kg 70 kg 69.7 kg    Examination: General: This is a chronically ill 84 year old black male he is resting in bed and in no acute distress on mechanical ventilation HEENT temporal wasting  orally intubated poor dentition no JVD mucous membranes moist Pulmonary: Coarse and diffuse rhonchi no accessory use on mechanically assisted breath Cardiac: Rapid irregular heart rhythm atrial fibrillation appreciated on telemetry Abdomen: Soft, midabdominal incision well-healed.  Hypoactive bowel sounds.  No pain Extremities: Warm and dry does have some dependent edema/anasarca.  Pulses are palpable Neuro: Awake, tries to interact, will follow commands.  Tremulous both upper extremities. GU: Clear yellow  Assessment & Plan:   Acute hypoxic respiratory failure requiring intubation - presumed due to aspiration especially in light of underlying hx of recurrent aspiration. Portable chest x-ray personally reviewed demonstrating predominantly right-sided airspace disease endotracheal tubes in satisfactory position Plan Continue full ventilator support Weaning to wean PEEP as indicated Repeating arterial blood gas later this a.m. Continue VAP protocol PAD protocol with RA SS goal 0 to -1 Continue IV vancomycin and cefepime day #2 Await current pending culture data Repeat a.m. chest x-ray   Hx COPD.  Severe with FEV1 less than 30%.  No current evidence of exacerbation Plan Continue bronchodilators   Atrial fibrillation with episodes of RVR currently blood pressure within goal Plan Avoid hypokalemia and hypomagnesemia Treat pneumonia Continue telemetry monitoring Already on Lovenox, continue this   Hx DVTs / PE - s/p IVC filter and on lovenox. Plan Continuing preadmission low molecular weight heparin  AKI: Suspect ATN Plan Avoid hypotension Renal dose medications Strict intake output A.m. chemistry  Fluid and electrolyte  imbalance: Non-anion gap metabolic acidosis in the setting of hyperchloremia Plan Discontinue sodium chloride, add lactated Ringer's Adjusting minute ventilation Follow-up blood chemistry in a.m. Repeat arterial blood gas later this a.m.  Hx DM.  Episode of hypoglycemia Plan Resuming TPN Sensitive sliding scale insulin Hold any basal insulin for now  Hx gastric adenocarcinoma - on chronic TPN. Plan Resume TPN Okay to take meds via tube, he had been receiving full liquids orally, I am not sure he is safe to be taking orals with his aspiration risk  Failure to thrive. He has been followed by palliative care in the outpatient setting.  Review of their notes from 6/5 demonstrate ongoing weight loss, poor activity tolerance, also suggests fairly poor insight to his prognosis Plan We will ask palliative to continue supportive care while here in-house    Best Practice:  Diet: NPO. Pain/Anxiety/Delirium protocol (if indicated): Fentanyl PRN / Midazolam PRN.  RASS goal 0 to -1. VAP protocol (if indicated): In place. DVT prophylaxis: SCD's / Lovenox. GI prophylaxis: PPI. Glucose control: SSI. Mobility: Bedrest. Code Status: Full. Family Communication: None available. Disposition: Continue ICU status, working on weaning, changing IV fluid maintenance, resuming TPN, continue antibiotics.  Critical care time: 69 minutes      Erick Colace, [place] [phone]  Attending Note:  84 year old with extensive PMH and recurrent aspiration pneumonia who had a partial gastrectomy in 04/2018 for grade I adeno of the stomach who presents with another episode of aspiration and VDRV.  Patient is TPN dependent at SNF due to inability to eat post op.  No events since admission, no new complaints.  On exam, coarse BS diffusely with grimacing upon palpating his abdomen but no signs of a surgical abdomen.  I reviewed CXR myself, ETT is in a good position with RLL infiltrate noted.  Discussed with PCCM-NP.  Will continue full vent support for now.  Restart TPN.  D/C IVF after TPN is available.  Call palliative care for [place] as I believe with frequent aspiration and hospitalization the patient will not do well.  PCCM will continue to manage.  The patient  is critically ill with multiple organ systems failure and requires high complexity decision making for assessment and support, frequent evaluation and titration of therapies, application of advanced monitoring technologies and extensive interpretation of multiple databases.   Critical Care Time devoted to patient care services described in this note is  45  Minutes. This time reflects time of care of this signee Dr Jennet Maduro. This critical care time does not reflect procedure time, or teaching time or supervisory time of PA/NP/Med student/Med Resident etc but could involve care discussion time.  Rush Farmer, M.D. [facility] Pulmonary/Critical Care Medicine. Pager: [phone]. After hours pager: [phone].

## 2018-08-20 NOTE — ED Provider Notes (Signed)
  Physical Exam  BP (!) 99/58 (BP Location: Right Arm)   Pulse (!) 101   Temp (!) 96.8 F (36 C) (Axillary)   Resp 18   Ht 5\' 7"  (1.702 m)   Wt 69.7 kg   SpO2 94%   BMI 24.07 kg/m   Physical Exam  ED Course/Procedures   Clinical Course as of Aug 19 540  Thu Aug 20, 2018  0157 Patient recheck. BP 110/60s with HR in the low 100s. RR improved to 18. He was received 2250 ccs of fluid.  Nursing staff reports no urine output today.  Will order an additional liter of fluids.   [MM]    Clinical Course User Index [MM] Joanne Gavel, PA-C    Procedure Name: Intubation Date/Time: 08/20/2018 5:42 AM Performed by: Orpah Greek, MD Pre-anesthesia Checklist: Patient identified, Patient being monitored, Emergency Drugs available, Timeout performed and Suction available Oxygen Delivery Method: Non-rebreather mask Preoxygenation: Pre-oxygenation with 100% oxygen Induction Type: Rapid sequence Ventilation: Mask ventilation without difficulty Laryngoscope Size: Glidescope and 4 Grade View: Grade I Tube size: 7.5 mm Number of attempts: 1 Placement Confirmation: ETT inserted through vocal cords under direct vision,  CO2 detector and Breath sounds checked- equal and bilateral Secured at: 24 cm Dental Injury: Teeth and Oropharynx as per pre-operative assessment        MDM  Patient presented with shortness of breath and mental status changes. Patient found to febrile with evidence of septic shock. Patient unable to protect airway, was intubated. Septic shock treated with IVF resuscitation, care turned over to ICU team.      Orpah Greek, MD 08/20/18 [phone]

## 2018-08-20 NOTE — Progress Notes (Signed)
eLink Physician-Brief Progress Note Patient Name: Timothy Garcia DOB: [birth year] MRN: [id number]   Date of Service  08/20/2018  HPI/Events of Note  Pt admitted through the ED with presumed aspiration pneumonia, altered mental status and sepsis. He is on broad spectrum antibiotic coverage.  eICU Interventions  New  Patient evaluation completed.        [name] [name] 08/20/2018, 1:22 AM

## 2018-08-20 NOTE — Progress Notes (Signed)
PHARMACY - ADULT TOTAL PARENTERAL NUTRITION CONSULT NOTE   Pharmacy Consult for TPN Indication: gastric adenocarcinoma s/p gastrectomy  Patient Measurements: Height: 5\' 7"  (170.2 cm) Weight: 153 lb 10.6 oz (69.7 kg) IBW/kg (Calculated) : 66.1 TPN AdjBW (KG): 69.7 Body mass index is 24.07 kg/m.   Assessment:  On chronic TPN from [place]. Patient with gastric cancer and FTT, follows with palliative care as an outpatient. Admitted from nursing home with respiratory distress requiring intubation.   GI: Has been receiving TPN as outpatient. Last prealbumin was 16 from May. Endo: cbgs 80-260s, a1c = 10 Insulin requirements in the past 24 hours: 0 units Lytes: Na, K, Mg, Phos wnl, CoCa 9.5 Renal: SCr 1.2, BUN elevated, has d5LR @ 75 Pulm: Vent Cards: VSS Hepatobil: AST/ALT 23/60, Tbili wnl, TG 217 Neuro: Sedated on vent, RASS -1 ID: ?Aspiration pneumonia on vancomycin and cefepime AC: Full dose Lovenox for recurrent VTEs  TPN Access: PICC TPN start date: Continue from PTA Nutritional Goals (per RD recommendation on ): Awaiting recommendations kCal: Protein:  Fluid:   Current Nutrition:  Able to use OG tube for medications Full liquid diet  Home TPN: 280 g dextrose, 85 g amino acid, 70 g lipid for a total of 1992 kcal in 1800 mL    Plan:  -Initiate TPN at 75 mL/hr -Hold 20% lipid emulsion for first 7 days for ICU patients per ASPEN guidelines (Start date 08/27/18) -This TPN provides 90 g of protein, 414 g of dextrose which provides 1767 kCals per day -Electrolytes in TPN: Increase Mg, other electrolytes to standard, KW:IOXBDZH to max acetate -Add MVI, trace elements Mon/Wed/Fri -Reduce D5LR to 30 ml/hr   Timothy Garcia 08/20/2018,9:24 AM

## 2018-08-21 ENCOUNTER — Encounter (HOSPITAL_COMMUNITY): Payer: Self-pay

## 2018-08-21 ENCOUNTER — Inpatient Hospital Stay (HOSPITAL_COMMUNITY): Payer: Medicare Other

## 2018-08-21 ENCOUNTER — Other Ambulatory Visit: Payer: Self-pay

## 2018-08-21 DIAGNOSIS — E119 Type 2 diabetes mellitus without complications: Secondary | ICD-10-CM

## 2018-08-21 DIAGNOSIS — Z86711 Personal history of pulmonary embolism: Secondary | ICD-10-CM

## 2018-08-21 DIAGNOSIS — I10 Essential (primary) hypertension: Secondary | ICD-10-CM

## 2018-08-21 DIAGNOSIS — J449 Chronic obstructive pulmonary disease, unspecified: Secondary | ICD-10-CM

## 2018-08-21 DIAGNOSIS — Z903 Acquired absence of stomach [part of]: Secondary | ICD-10-CM

## 2018-08-21 DIAGNOSIS — Z8701 Personal history of pneumonia (recurrent): Secondary | ICD-10-CM

## 2018-08-21 DIAGNOSIS — Z9989 Dependence on other enabling machines and devices: Secondary | ICD-10-CM

## 2018-08-21 DIAGNOSIS — R0602 Shortness of breath: Secondary | ICD-10-CM

## 2018-08-21 DIAGNOSIS — Z86718 Personal history of other venous thrombosis and embolism: Secondary | ICD-10-CM

## 2018-08-21 DIAGNOSIS — Z9911 Dependence on respirator [ventilator] status: Secondary | ICD-10-CM

## 2018-08-21 DIAGNOSIS — Z888 Allergy status to other drugs, medicaments and biological substances status: Secondary | ICD-10-CM

## 2018-08-21 DIAGNOSIS — B49 Unspecified mycosis: Secondary | ICD-10-CM

## 2018-08-21 DIAGNOSIS — Z95828 Presence of other vascular implants and grafts: Secondary | ICD-10-CM

## 2018-08-21 DIAGNOSIS — Z85020 Personal history of malignant carcinoid tumor of stomach: Secondary | ICD-10-CM

## 2018-08-21 LAB — POCT I-STAT 7, (LYTES, BLD GAS, ICA,H+H)
Acid-base deficit: 10 mmol/L — ABNORMAL HIGH (ref 0.0–2.0)
Bicarbonate: 15.3 mmol/L — ABNORMAL LOW (ref 20.0–28.0)
Calcium, Ion: 1.31 mmol/L (ref 1.15–1.40)
HCT: 21 % — ABNORMAL LOW (ref 39.0–52.0)
Hemoglobin: 7.1 g/dL — ABNORMAL LOW (ref 13.0–17.0)
O2 Saturation: 91 %
Patient temperature: 98.1
Potassium: 4.3 mmol/L (ref 3.5–5.1)
Sodium: 147 mmol/L — ABNORMAL HIGH (ref 135–145)
TCO2: 16 mmol/L — ABNORMAL LOW (ref 22–32)
pCO2 arterial: 31.5 mmHg — ABNORMAL LOW (ref 32.0–48.0)
pH, Arterial: 7.291 — ABNORMAL LOW (ref 7.350–7.450)
pO2, Arterial: 67 mmHg — ABNORMAL LOW (ref 83.0–108.0)

## 2018-08-21 LAB — MAGNESIUM: Magnesium: 1.9 mg/dL (ref 1.7–2.4)

## 2018-08-21 LAB — ECHOCARDIOGRAM COMPLETE
Height: 67 in
Weight: 2472.68 oz

## 2018-08-21 LAB — GLUCOSE, CAPILLARY
Glucose-Capillary: 101 mg/dL — ABNORMAL HIGH (ref 70–99)
Glucose-Capillary: 114 mg/dL — ABNORMAL HIGH (ref 70–99)
Glucose-Capillary: 139 mg/dL — ABNORMAL HIGH (ref 70–99)
Glucose-Capillary: 142 mg/dL — ABNORMAL HIGH (ref 70–99)
Glucose-Capillary: 163 mg/dL — ABNORMAL HIGH (ref 70–99)
Glucose-Capillary: 178 mg/dL — ABNORMAL HIGH (ref 70–99)
Glucose-Capillary: 190 mg/dL — ABNORMAL HIGH (ref 70–99)
Glucose-Capillary: 222 mg/dL — ABNORMAL HIGH (ref 70–99)
Glucose-Capillary: 282 mg/dL — ABNORMAL HIGH (ref 70–99)
Glucose-Capillary: 344 mg/dL — ABNORMAL HIGH (ref 70–99)
Glucose-Capillary: 393 mg/dL — ABNORMAL HIGH (ref 70–99)
Glucose-Capillary: 439 mg/dL — ABNORMAL HIGH (ref 70–99)
Glucose-Capillary: 476 mg/dL — ABNORMAL HIGH (ref 70–99)
Glucose-Capillary: 497 mg/dL — ABNORMAL HIGH (ref 70–99)
Glucose-Capillary: 498 mg/dL — ABNORMAL HIGH (ref 70–99)
Glucose-Capillary: 92 mg/dL (ref 70–99)
Glucose-Capillary: 96 mg/dL (ref 70–99)

## 2018-08-21 LAB — LACTIC ACID, PLASMA
Lactic Acid, Venous: 2.1 mmol/L (ref 0.5–1.9)
Lactic Acid, Venous: 1.7 mmol/L (ref 0.5–1.9)

## 2018-08-21 LAB — COMPREHENSIVE METABOLIC PANEL
ALT: 39 U/L (ref 0–44)
AST: 21 U/L (ref 15–41)
Albumin: 1.4 g/dL — ABNORMAL LOW (ref 3.5–5.0)
Alkaline Phosphatase: 266 U/L — ABNORMAL HIGH (ref 38–126)
Anion gap: 8 (ref 5–15)
BUN: 41 mg/dL — ABNORMAL HIGH (ref 8–23)
CO2: 16 mmol/L — ABNORMAL LOW (ref 22–32)
Calcium: 7.9 mg/dL — ABNORMAL LOW (ref 8.9–10.3)
Chloride: 119 mmol/L — ABNORMAL HIGH (ref 98–111)
Creatinine, Ser: 1.24 mg/dL (ref 0.61–1.24)
GFR calc Af Amer: >60 mL/min (ref >60)
GFR calc non Af Amer: 54 mL/min — ABNORMAL LOW (ref >60)
Glucose, Bld: 436 mg/dL — ABNORMAL HIGH (ref 70–99)
Potassium: 3.9 mmol/L (ref 3.5–5.1)
Sodium: 143 mmol/L (ref 135–145)
Total Bilirubin: 0.3 mg/dL (ref 0.3–1.2)
Total Protein: 5.9 g/dL — ABNORMAL LOW (ref 6.5–8.1)

## 2018-08-21 LAB — URINE CULTURE: Culture: NO GROWTH

## 2018-08-21 LAB — TRIGLYCERIDES: Triglycerides: 290 mg/dL — ABNORMAL HIGH (ref <150)

## 2018-08-21 LAB — BLOOD CULTURE ID PANEL (REFLEXED)
Acinetobacter baumannii: NOT DETECTED
Candida albicans: DETECTED — AB
Candida glabrata: NOT DETECTED
Candida krusei: NOT DETECTED
Candida parapsilosis: NOT DETECTED
Candida tropicalis: NOT DETECTED
Enterobacter cloacae complex: NOT DETECTED
Enterobacteriaceae species: NOT DETECTED
Enterococcus species: NOT DETECTED
Escherichia coli: NOT DETECTED
Haemophilus influenzae: NOT DETECTED
Klebsiella oxytoca: NOT DETECTED
Klebsiella pneumoniae: NOT DETECTED
Listeria monocytogenes: NOT DETECTED
Neisseria meningitidis: NOT DETECTED
Proteus species: NOT DETECTED
Pseudomonas aeruginosa: NOT DETECTED
Serratia marcescens: NOT DETECTED
Staphylococcus aureus (BCID): NOT DETECTED
Staphylococcus species: NOT DETECTED
Streptococcus agalactiae: NOT DETECTED
Streptococcus pneumoniae: NOT DETECTED
Streptococcus pyogenes: NOT DETECTED
Streptococcus species: NOT DETECTED

## 2018-08-21 LAB — CBC
HCT: 28.3 % — ABNORMAL LOW (ref 39.0–52.0)
Hemoglobin: 8.3 g/dL — ABNORMAL LOW (ref 13.0–17.0)
MCH: 24.9 pg — ABNORMAL LOW (ref 26.0–34.0)
MCHC: 29.3 g/dL — ABNORMAL LOW (ref 30.0–36.0)
MCV: 85 fL (ref 80.0–100.0)
Platelets: 314 10*3/uL (ref 150–400)
RBC: 3.33 MIL/uL — ABNORMAL LOW (ref 4.22–5.81)
RDW: 17.8 % — ABNORMAL HIGH (ref 11.5–15.5)
WBC: 17.3 10*3/uL — ABNORMAL HIGH (ref 4.0–10.5)
nRBC: 0.2 % (ref 0.0–0.2)

## 2018-08-21 LAB — LEGIONELLA PNEUMOPHILA SEROGP 1 UR AG: L. pneumophila Serogp 1 Ur Ag: NEGATIVE

## 2018-08-21 LAB — PREALBUMIN: Prealbumin: 5.6 mg/dL — ABNORMAL LOW (ref 18–38)

## 2018-08-21 LAB — PHOSPHORUS: Phosphorus: 2 mg/dL — ABNORMAL LOW (ref 2.5–4.6)

## 2018-08-21 MED ORDER — DEXTROSE 10 % IV SOLN
INTRAVENOUS | Status: DC | PRN
  Administered 2018-08-30 – 2018-09-01 (×2): via INTRAVENOUS
  Filled 2018-08-21: qty 1000

## 2018-08-21 MED ORDER — FLUCONAZOLE IN SODIUM CHLORIDE 400-0.9 MG/200ML-% IV SOLN
800.0000 mg | Freq: Once | INTRAVENOUS | Status: AC
  Administered 2018-08-21: 800 mg via INTRAVENOUS
  Filled 2018-08-21: qty 400

## 2018-08-21 MED ORDER — MIDAZOLAM HCL 2 MG/2ML IJ SOLN
1.0000 mg | INTRAMUSCULAR | Status: DC | PRN
  Administered 2018-08-21 – 2018-08-31 (×7): 1 mg via INTRAVENOUS
  Filled 2018-08-21 (×7): qty 2

## 2018-08-21 MED ORDER — INSULIN DETEMIR 100 UNIT/ML ~~LOC~~ SOLN
5.0000 [IU] | Freq: Two times a day (BID) | SUBCUTANEOUS | Status: DC
  Administered 2018-08-21 – 2018-08-26 (×11): 5 [IU] via SUBCUTANEOUS
  Filled 2018-08-21 (×13): qty 0.05

## 2018-08-21 MED ORDER — VITAL 1.5 CAL PO LIQD
1000.0000 mL | ORAL | Status: DC
  Administered 2018-08-21 – 2018-08-22 (×2): 1000 mL
  Filled 2018-08-21 (×2): qty 1000

## 2018-08-21 MED ORDER — SODIUM CHLORIDE 0.9 % IV SOLN
3.0000 g | Freq: Three times a day (TID) | INTRAVENOUS | Status: DC
  Administered 2018-08-21 – 2018-08-25 (×13): 3 g via INTRAVENOUS
  Filled 2018-08-21 (×14): qty 3

## 2018-08-21 MED ORDER — INSULIN REGULAR(HUMAN) IN NACL 100-0.9 UT/100ML-% IV SOLN
INTRAVENOUS | Status: DC
  Administered 2018-08-21: 4.2 [IU]/h via INTRAVENOUS
  Filled 2018-08-21 (×2): qty 100

## 2018-08-21 MED ORDER — POTASSIUM PHOSPHATES 15 MMOLE/5ML IV SOLN
20.0000 mmol | Freq: Once | INTRAVENOUS | Status: AC
  Administered 2018-08-21: 20 mmol via INTRAVENOUS
  Filled 2018-08-21: qty 6.67

## 2018-08-21 MED ORDER — FLUCONAZOLE IN SODIUM CHLORIDE 400-0.9 MG/200ML-% IV SOLN
400.0000 mg | INTRAVENOUS | Status: DC
  Administered 2018-08-22 – 2018-08-26 (×5): 400 mg via INTRAVENOUS
  Filled 2018-08-21 (×5): qty 200

## 2018-08-21 MED ORDER — INSULIN ASPART 100 UNIT/ML ~~LOC~~ SOLN
1.0000 [IU] | SUBCUTANEOUS | Status: DC
  Administered 2018-08-21 – 2018-08-26 (×24): 1 [IU] via SUBCUTANEOUS

## 2018-08-21 MED ORDER — LACTATED RINGERS IV SOLN
INTRAVENOUS | Status: DC
  Administered 2018-08-21 – 2018-08-23 (×3): via INTRAVENOUS

## 2018-08-21 MED ORDER — INSULIN ASPART 100 UNIT/ML ~~LOC~~ SOLN
2.0000 [IU] | SUBCUTANEOUS | Status: DC
  Administered 2018-08-21: 2 [IU] via SUBCUTANEOUS
  Administered 2018-08-22: 4 [IU] via SUBCUTANEOUS
  Administered 2018-08-22: 2 [IU] via SUBCUTANEOUS
  Administered 2018-08-22: 4 [IU] via SUBCUTANEOUS
  Administered 2018-08-22 (×2): 2 [IU] via SUBCUTANEOUS
  Administered 2018-08-23 (×2): 4 [IU] via SUBCUTANEOUS
  Administered 2018-08-23 – 2018-08-25 (×7): 2 [IU] via SUBCUTANEOUS
  Administered 2018-08-26: 4 [IU] via SUBCUTANEOUS
  Administered 2018-08-26 (×4): 2 [IU] via SUBCUTANEOUS
  Administered 2018-08-26: 4 [IU] via SUBCUTANEOUS
  Administered 2018-08-27 (×2): 6 [IU] via SUBCUTANEOUS
  Administered 2018-08-27 (×2): 4 [IU] via SUBCUTANEOUS
  Administered 2018-08-27: 6 [IU] via SUBCUTANEOUS
  Administered 2018-08-27: 4 [IU] via SUBCUTANEOUS
  Administered 2018-08-27 – 2018-08-28 (×3): 6 [IU] via SUBCUTANEOUS
  Administered 2018-08-28: 4 [IU] via SUBCUTANEOUS
  Administered 2018-08-28: 2 [IU] via SUBCUTANEOUS
  Administered 2018-08-29 (×5): 6 [IU] via SUBCUTANEOUS
  Administered 2018-08-30 (×2): 4 [IU] via SUBCUTANEOUS
  Administered 2018-08-30: 6 [IU] via SUBCUTANEOUS
  Administered 2018-08-30 – 2018-08-31 (×4): 4 [IU] via SUBCUTANEOUS
  Administered 2018-08-31: 2 [IU] via SUBCUTANEOUS
  Administered 2018-08-31: 4 [IU] via SUBCUTANEOUS
  Administered 2018-09-01 (×3): 2 [IU] via SUBCUTANEOUS
  Administered 2018-09-01 – 2018-09-02 (×2): 4 [IU] via SUBCUTANEOUS
  Administered 2018-09-02: 2 [IU] via SUBCUTANEOUS

## 2018-08-21 NOTE — Progress Notes (Signed)
eLink Physician-Brief Progress Note Patient Name: Timothy Garcia DOB: 1934/12/27 MRN: [id number]   Date of Service  08/21/2018  HPI/Events of Note  Hyperglycemia which is getting worse with blood sugar up to 436 mg %  eICU Interventions  Insulin infusion ordered        Frederik Pear 08/21/2018, 3:27 AM

## 2018-08-21 NOTE — Progress Notes (Signed)
Notified MD of run of V tach- orders obtained to draw labs. Also notified of high CBGs' order obtained to start insulin gtt.  Will continue to monitor

## 2018-08-21 NOTE — Progress Notes (Signed)
NAME:  Timothy Garcia, MRN:  [id number], DOB:  04-06-1934, LOS: 1 ADMISSION DATE:  08/18/2018, CONSULTATION DATE:  08/20/18 REFERRING MD:  Betsey Holiday  CHIEF COMPLAINT:  SOB   Brief History   Timothy Garcia is a 84 y.o. male who resides at SNF and has hx recurrent aspiration, admitted 6/24 with hypoxic respiratory failure presumed due to aspiration.  Required intubation in ED.   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  Significant Hospital Events   6/25 > admit.  Intubated, culture sent, IV fluids started, antibiotics initiated  Consults:  None.  Procedures:  ETT 6/24 >   Significant Diagnostic Tests:  CXR 6/24 > bilateral opacities R > L with bibasilar atelectasis.  Micro Data:  Blood 6/24 >  Sputum 6/24 >  Urine 6/24 >  U. Strep 6/25 > negative U. Legionella 6/25 >  SARS CoV2 6/24 > negative.  Antimicrobials:  Vanc 6/24 >  Cefepime 6/24 >    Interim history/subjective:  More awake, hemodynamically stable  Objective:  Blood pressure (!) 134/57, pulse (!) 120, temperature 99 F (37.2 C), temperature source Axillary, resp. rate (!) 30, height 5\' 7"  (1.702 m), weight 70.1 kg, SpO2 99 %.    Vent Mode: PRVC FiO2 (%):  [40 %] 40 % Set Rate:  [28 bmp] 28 bmp Vt Set:  [520 mL] 520 mL PEEP:  [8 cmH20] 8 cmH20 Plateau Pressure:  [24 cmH20-26 cmH20] 24 cmH20   Intake/Output Summary (Last 24 hours) at 08/21/2018 1100 Last data filed at 08/21/2018 1000 Gross per 24 hour  Intake 3035.86 ml  Output 1550 ml  Net 1485.86 ml   Filed Weights   08/09/2018 2329 08/20/18 0408 08/21/18 0500  Weight: 70 kg 69.7 kg 70.1 kg    Examination: General: NAD HEENT temporal wasting, ETT in place, poor dentition  Pulmonary: Coarse and diffuse rhonchi no accessory use on mechanically assisted breath Cardiac: Rapid irregular heart rhythm atrial  fibrillation appreciated on telemetry Abdomen: Soft, midabdominal incision well-healed.  Hypoactive bowel sounds.  No pain Extremities: Warm and dry does have some dependent edema/anasarca.  Pulses are palpable Neuro: Awake, tries to interact, will follow commands.  Tremulous both upper extremities. GU: Clear yellow  Assessment & Plan:   Acute hypoxic respiratory failure requiring intubation - presumed due to aspiration especially in light of underlying hx of recurrent aspiration. Portable chest x-ray personally reviewed demonstrating predominantly right-sided airspace disease endotracheal tubes in satisfactory position Plan Continue full ventilator support Weaning to wean PEEP as indicated Repeating arterial blood gas later this a.m. Continue VAP protocol PAD protocol with RA SS goal 0 to -1 Continue IV vancomycin and cefepime day #2 Await current pending culture data Repeat a.m. chest x-ray   Hx COPD.  Severe with FEV1 less than 30%.  No current evidence of exacerbation Plan Continue bronchodilators   Atrial fibrillation with episodes of RVR currently blood pressure within goal Plan Avoid hypokalemia and hypomagnesemia Treat pneumonia Continue telemetry monitoring Already on Lovenox, continue this   Hx DVTs / PE - s/p IVC filter and on lovenox. Plan Continuing preadmission low molecular weight heparin  AKI: Suspect ATN Plan Avoid hypotension Renal dose medications Strict intake output A.m. chemistry  Fluid and electrolyte imbalance: Non-anion gap metabolic acidosis in the setting of hyperchloremia Plan  Adjusting minute ventilation Follow CMP, CBC  Hx DM. Episode of hypoglycemia Plan Resuming TPN  Sensitive sliding scale insulin Hold any basal insulin for now  Hx gastric adenocarcinoma - on chronic TPN. Plan Resume TPN Okay to take meds via tube, he had been receiving full liquids orally, I am not sure he is safe to be taking orals with his aspiration  risk  Failure to thrive. He has been followed by palliative care in the outpatient setting.  Review of their notes from 6/5 demonstrate ongoing weight loss, poor activity tolerance, also suggests fairly poor insight to his prognosis Plan We will ask palliative to continue supportive care while here in-house    Best Practice:  Diet: NPO. Pain/Anxiety/Delirium protocol (if indicated): Fentanyl PRN / Midazolam PRN.  RASS goal 0 to -1. VAP protocol (if indicated): In place. DVT prophylaxis: SCD's / Lovenox. GI prophylaxis: PPI. Glucose control: SSI. Mobility: Bedrest. Code Status: Full. Family Communication: None available. Disposition: Continue ICU, working on weaning, TPN modification as able to improve glycemic control, continue antibiotics.    I have independently seen and examined the patient, reviewed data, and developed an assessment and plan. A total of 36 minutes were spent in critical care assessment and medical decision making. This critical care time does not reflect procedure time, or teaching time or supervisory time of PA/NP/Med student/Med Resident, etc but could involve care discussion time.  Bonna Gains, MD PhD 08/21/18 11:10 AM  Clydell Hakim, MD

## 2018-08-21 NOTE — Progress Notes (Signed)
Daily Progress Note   Patient Name: Timothy Garcia       Date: 08/21/2018 DOB: [birth year]  Age: 84 y.o. MRN#: [id number] Attending Physician: Timothy Mins, MD Primary Care Physician: Timothy Blocker, MD Admit Date: 08/23/2018  Reason for Consultation/Follow-up: Establishing goals of care  Subjective:  more awake Nods head and blinks eyes appropriately In mild distress, tachycardic on monitor.  He is on the vent On TPN Now also on fluconazole due to candida in blood cultures.   I tried to explain to the patient about his current medical condition, discussed with him that he was brought in with aspiration PNA, he has infection in his blood, is on chronic TPN. Patient nods and attempts to follow along. He does tire easily, got more tachycardic and closed his eyes after a few minutes.   Length of Stay: 1  Current Medications: Scheduled Meds:  . chlorhexidine gluconate (MEDLINE KIT)  15 mL Mouth Rinse BID  . Chlorhexidine Gluconate Cloth  6 each Topical Daily  . Chlorhexidine Gluconate Cloth  6 each Topical Daily  . enoxaparin (LOVENOX) injection  80 mg Subcutaneous Q24H  . ipratropium-albuterol  3 mL Nebulization Q6H  . mouth rinse  15 mL Mouth Rinse 10 times per day  . pantoprazole (PROTONIX) IV  40 mg Intravenous QHS  . sodium chloride flush  3 mL Intravenous Once    Continuous Infusions: . ampicillin-sulbactam (UNASYN) IV 3 g (08/21/18 1121)  . dextrose 5% lactated ringers Stopped (08/21/18 0957)  . [START ON 08/22/2018] fluconazole (DIFLUCAN) IV    . insulin 11.1 mL/hr at 08/21/18 1000  . TPN ADULT (ION) 75 mL/hr at 08/21/18 1000    PRN Meds: albuterol, fentaNYL (SUBLIMAZE) injection, fentaNYL (SUBLIMAZE) injection, midazolam, sodium chloride flush  Physical Exam         Has  ETT Resting in bed Coarse breath sounds Irregular and tachycardic Dependent edema Awake alert, nods and blinks appropriately  Vital Signs: BP (!) 134/57   Pulse (!) 120   Temp 99 F (37.2 C) (Axillary)   Resp (!) 30   Ht 5' 7" (1.702 m)   Wt 70.1 kg   SpO2 99%   BMI 24.20 kg/m  SpO2: SpO2: 99 % O2 Device: O2 Device: Ventilator O2 Flow Rate: O2 Flow Rate (L/min): 15 L/min  Intake/output summary:  Intake/Output Summary (Last 24 hours) at 08/21/2018 1123 Last data filed at 08/21/2018 1000 Gross per 24 hour  Intake 3035.86 ml  Output 1550 ml  Net 1485.86 ml   LBM:   Baseline Weight: Weight: 73.1 kg Most recent weight: Weight: 70.1 kg       Palliative Assessment/Data:    Flowsheet Rows     Most Recent Value  Intake Tab  Referral Department  Critical care  Unit at Time of Referral  ICU  Palliative Care Primary Diagnosis  Cancer  Palliative Care Type  New Palliative care  Reason for referral  Clarify Goals of Care  Date first seen by Palliative Care  08/20/18  Clinical Assessment  Palliative Performance Scale Score  20%  Pain Max last 24 hours  4  Pain Min Last 24 hours  3  Dyspnea Max Last 24 Hours  4  Dyspnea Min Last 24 hours  3  Psychosocial & Spiritual Assessment  Palliative Care Outcomes      Patient Active Problem List   Diagnosis Date Noted  . Acute hypoxemic respiratory failure ([place]) 08/20/2018  . Aspiration pneumonia of both lower lobes ([place])   . Palliative care by specialist   . Goals of care, counseling/discussion   . Sepsis ([place]) 07/16/2018  . Hyperglycemia 07/16/2018  . Benign essential HTN 07/16/2018  . AMS (altered mental status) 07/16/2018  . COPD with acute exacerbation ([place]) 07/16/2018  . CKD (chronic kidney disease) stage 3, GFR 30-59 ml/min ([facility]) 07/16/2018  . Emphysema of lung ([place]) 07/16/2018  . NSVT (nonsustained ventricular tachycardia) ([place]) 07/16/2018    Palliative Care Assessment & Plan   Patient Profile:     Assessment:  Timothy Garcia a 84 y.o.malewho resides at [facility] and has hx recurrent aspiration, admitted 6/24 with hypoxic respiratory failure presumed due to aspiration. Required intubation in ED. 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  The patient remains intubated, PMT consult for ongoing goals of care discussions.   Tachycardic On the vent More awake alert Is on TPN Has candida in blood, pharmacy following, was already on vanc cefepime, now also started on fluconazole.  PMT following for goals of care discussions, as per out patient palliative care note, patient had endorsed full code, full scope treatment, this is his HCPOA sister's understanding as well, she is Timothy Timothy Garcia who lives locally.  Patient also has children in [place], noted daughter's number from out patient palliative note, call placed but unable to reach daughter Timothy Garcia at [phone]. Goes straight to voicemail and mailbox is full, unable to reach a message.   Recommendations/Plan:  PMT to continue to follow over the weekend.   Monitor hospital course, patient's disease trajectory, ability to tolerate weaning trials. Remains on anti fungal and broad spectrum antibiotics.   Initial goals of care discussions started with sister Timothy Garcia over the phone, will have to discuss with patient, sister and daughter if she is available regarding long term goals. The patient remains at high risk for ongoing decline and decompensation, remains at high risk for ongoing aspiration and resp compromise events. Was with frailty and deconditioning, not able to participate much in rehab recently, according to sister and chart review.    Goals of Care and Additional Recommendations:  Limitations on Scope of Treatment: Full Scope Treatment  Code Status:     Code Status  Orders  (From admission, onward)         Start     Ordered   08/20/18 0153  Full code  Continuous     08/20/18 0154        Code Status History    Date Active Date Inactive Code Status Order ID Comments User Context   07/16/2018 2113 07/21/2018 2110 Full Code [id number]  Elwyn Reach, MD Inpatient   05/20/2018 2308 06/29/2018 1744 Full Code [id number]  Horald Chestnut Inpatient   Advance Care Planning Activity       Prognosis:   Unable to determine  Discharge Planning:  To Be Determined  Care plan was discussed with patient  Who is still intubated, but much more awake alert and interactive, he nods his head yes/no and follows the conversation. Also discussed with bedside RN.   Thank you for allowing the Palliative Medicine Team to assist in the care of this patient.   Time In:  10.30 Time Out: 11.05 Total Time 35 Prolonged Time Billed  no       Greater than 50%  of this time was spent counseling and coordinating care related to the above assessment and plan. PPS 30% Loistine Chance, MD [phone] Please contact Palliative Medicine Team phone at [phone] for questions and concerns.

## 2018-08-21 NOTE — Consult Note (Signed)
[place] for Infectious Disease    Date of Admission:  07/29/2018     Total days of antibiotics 3               Reason for Consult: Fungemia   Referring Provider: [facility] Primary Care Provider: Rogers Blocker, MD   Assessment/Plan:  [name] is an 84 year old male TPN dependent following gastrectomy in March 2020 and recurrent aspiration pneumonia admitted with worsening mental status and hypoxia and found to have aspiration pneumonia and Candida Albicans fungemia. Respiratory status required ventilatory support. Current antimicrobials changed to Unasyn for aspiration pneumonia and Fluconazole for fungemia.   Aspiration pneumonia - Continue current dose of Unasyn. May need swallow evaluation depending on nutritional status.   Fungemia - Will need to replace central line and require 48 hour line holiday. TPN will be stopped this evening. Remove central line tomorrow and repeat blood cultures. Replacement of central line pending clearance of cultures as the Central line although replaced is likely the source of infection. Will need ophthalmology evaluation to rule out endophthalmitis.   Type 2 diabetes - Most recent A1c of 10.0. Continue management per primary team.   Acute Hypoxemic Respiratory Failure - Likely result of aspiration pneumonia. Currently on full vent support. Continue management per Critical Care.    Active Problems:   Acute hypoxemic respiratory failure (HCC)   Aspiration pneumonia of both lower lobes [facility])   Palliative care by specialist   Goals of care, counseling/discussion   . chlorhexidine gluconate (MEDLINE KIT)  15 mL Mouth Rinse BID  . Chlorhexidine Gluconate Cloth  6 each Topical Daily  . Chlorhexidine Gluconate Cloth  6 each Topical Daily  . enoxaparin (LOVENOX) injection  80 mg Subcutaneous Q24H  . ipratropium-albuterol  3 mL Nebulization Q6H  . mouth rinse  15 mL Mouth Rinse 10 times per day  . pantoprazole (PROTONIX) IV  40 mg  Intravenous QHS  . sodium chloride flush  3 mL Intravenous Once     HPI: Timothy Garcia is a 84 y.o. 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 Mr. Cappuccio was previously admitted from 5/21-5/26 with staphylococcus epidermidis bacteremia with question of contaminant and treated for 4 days with vancomycin and piperacillin-tazobactam.    Previous chest x-ray prior to arrival with recurrent pneumonia and was receiving IV Zosyn. Rectal temp of 100.9 in the ED with tachycardia and tachypnea. Labs with creatinine of 1.45, WBC count of 28.8,  A1c of 10.0, and respiratory acidosis on blood gas. Mr. Berent was intubated in the ED for respiratory distress and hypoxia. Repeat chest x-ray with slight interval worsening of right sided interstitual and hazy pulmonary airspace disease and persistent left>right pleural effusion with basilar consolidation, atelectasis, or pneumonia. Antimicrobial therapy was changed to Vancomycin and Cefepime.  Mr. Camilli has been afebrile since admission with improvement in his WBC count with most recent being 17.3. Blood cultures now positive for fungemia. He remains intubated for airway protection and is able to nod appropriately.    Review of Systems: Review of Systems  Unable to perform ROS: Intubated     Past Medical History:  Diagnosis Date  . Anemia   . CKD (chronic kidney disease) stage 3, GFR  30-59 ml/min ([place])   . COPD (chronic obstructive pulmonary disease) ([place])   . Diabetes mellitus without complication ([place])    type 2  . Hypertension   . Hypoxia   . Malignant neoplasm of stomach ([place])   . Stenosis of carotid artery     Social History    Tobacco Use  . Smoking status: Unknown If Ever Smoked  Substance Use Topics  . Alcohol use: Not Currently  . Drug use: Never    History reviewed. No pertinent family history.  Allergies  Allergen Reactions  . Ace Inhibitors Other (See Comments)    Unable to recall Unable to recall   . Iothalamate Other (See Comments)    Patient does not know if allergic to this; able to receive current IV contrast (Omnipaque 350) without adverse effects     OBJECTIVE: Blood pressure 123/61, pulse (!) 120, temperature 99 F (37.2 C), temperature source Axillary, resp. rate (!) 35, height '5\' 7"'$  (1.702 m), weight 70.1 kg, SpO2 100 %.  Physical Exam Constitutional:      General: He is not in acute distress.    Appearance: He is well-developed. He is ill-appearing.     Interventions: He is intubated.  Cardiovascular:     Rate and Rhythm: Normal rate and regular rhythm.     Heart sounds: Normal heart sounds.  Pulmonary:     Effort: Pulmonary effort is normal. He is intubated.     Breath sounds: Normal breath sounds.  Skin:    General: Skin is warm and dry.  Neurological:     Mental Status: He is alert.     Lab Results Lab Results  Component Value Date   WBC 17.3 (H) 08/21/2018   HGB 7.1 (L) 08/21/2018   HCT 21.0 (L) 08/21/2018   MCV 85.0 08/21/2018   PLT 314 08/21/2018    Lab Results  Component Value Date   CREATININE 1.24 08/21/2018   BUN 41 (H) 08/21/2018   NA 147 (H) 08/21/2018   K 4.3 08/21/2018   CL 119 (H) 08/21/2018   CO2 16 (L) 08/21/2018    Lab Results  Component Value Date   ALT 39 08/21/2018   AST 21 08/21/2018   ALKPHOS 266 (H) 08/21/2018   BILITOT 0.3 08/21/2018     Microbiology: Recent Results (from the past 240 hour(s))  SARS Coronavirus 2 (CEPHEID- Performed in [place] hospital lab), Hosp Order     Status: None   Collection Time: 07/29/2018 10:38 PM   Specimen: Nasopharyngeal Swab  Result Value Ref Range Status   SARS Coronavirus 2 NEGATIVE  NEGATIVE Final    Comment: (NOTE) If result is NEGATIVE SARS-CoV-2 target nucleic acids are NOT DETECTED. The SARS-CoV-2 RNA is generally detectable in upper and lower  respiratory specimens during the acute phase of infection. The lowest  concentration of SARS-CoV-2 viral copies this assay can detect is 250  copies / mL. A negative result does not preclude SARS-CoV-2 infection  and should not be used as the sole basis for treatment or other  patient management decisions.  A negative result may occur with  improper specimen collection / handling, submission of specimen other  than nasopharyngeal swab, presence of viral mutation(s) within the  areas targeted by this assay, and inadequate number of viral copies  (<250 copies / mL). A negative result must be combined with clinical  observations, patient history, and epidemiological information. If result is POSITIVE SARS-CoV-2 target nucleic acids are DETECTED. The SARS-CoV-2 RNA is generally  detectable in upper and lower  respiratory specimens dur ing the acute phase of infection.  Positive  results are indicative of active infection with SARS-CoV-2.  Clinical  correlation with patient history and other diagnostic information is  necessary to determine patient infection status.  Positive results do  not rule out bacterial infection or co-infection with other viruses. If result is PRESUMPTIVE POSTIVE SARS-CoV-2 nucleic acids MAY BE PRESENT.   A presumptive positive result was obtained on the submitted specimen  and confirmed on repeat testing.  While 2019 novel coronavirus  (SARS-CoV-2) nucleic acids may be present in the submitted sample  additional confirmatory testing may be necessary for epidemiological  and / or clinical management purposes  to differentiate between  SARS-CoV-2 and other Sarbecovirus currently known to infect humans.  If clinically indicated additional testing with an alternate test  methodology [phone]) is  advised. The SARS-CoV-2 RNA is generally  detectable in upper and lower respiratory sp ecimens during the acute  phase of infection. The expected result is Negative. Fact Sheet for Patients:  StrictlyIdeas.no Fact Sheet for Healthcare Providers: BankingDealers.co.za This test is not yet approved or cleared by the [place] FDA and has been authorized for detection and/or diagnosis of SARS-CoV-2 by FDA under an Emergency Use Authorization (EUA).  This EUA will remain in effect (meaning this test can be used) for the duration of the COVID-19 declaration under Section 564(b)(1) of the Act, 21 U.S.C. section 360bbb-3(b)(1), unless the authorization is terminated or revoked sooner. Performed at [facility], [place] [address], [place], [place] [address]   Blood Culture (routine x 2)     Status: Abnormal (Preliminary result)   Collection Time: 07/30/2018 11:15 PM   Specimen: BLOOD RIGHT HAND  Result Value Ref Range Status   Specimen Description BLOOD RIGHT HAND  Final   Special Requests   Final    BOTTLES DRAWN AEROBIC ONLY Blood Culture results may not be optimal due to an inadequate volume of blood received in culture bottles   Culture  Setup Time (A)  Final    YEAST AEROBIC BOTTLE ONLY CRITICAL VALUE NOTED.  VALUE IS CONSISTENT WITH PREVIOUSLY REPORTED AND CALLED VALUE. Performed at [facility], [place] [address], [place], [place] [address]    Culture YEAST  Final   Report Status PENDING  Incomplete  Blood Culture (routine x 2)     Status: Abnormal (Preliminary result)   Collection Time: 08/16/2018 11:16 PM   Specimen: BLOOD RIGHT HAND  Result Value Ref Range Status   Specimen Description BLOOD RIGHT HAND  Final   Special Requests   Final    BOTTLES DRAWN AEROBIC AND ANAEROBIC Blood Culture results may not be optimal due to an inadequate volume of blood received in culture bottles   Culture  Setup Time   Final    AEROBIC  BOTTLE ONLY YEAST CRITICAL RESULT CALLED TO, READ BACK BY AND VERIFIED WITH: PHARMD L EAY [id number] AT 720 AM BY CM Performed at [facility], [place] [address] [place], [place] [address]    Culture YEAST (A)  Final   Report Status PENDING  Incomplete  Blood Culture ID Panel (Reflexed)     Status: Abnormal   Collection Time: 08/20/2018 11:16 PM  Result Value Ref Range Status   Enterococcus species NOT DETECTED NOT DETECTED Final   Listeria monocytogenes NOT DETECTED NOT DETECTED Final   Staphylococcus species NOT DETECTED NOT DETECTED Final   Staphylococcus aureus (BCID) NOT DETECTED  NOT DETECTED Final   Streptococcus species NOT DETECTED NOT DETECTED Final   Streptococcus agalactiae NOT DETECTED NOT DETECTED Final   Streptococcus pneumoniae NOT DETECTED NOT DETECTED Final   Streptococcus pyogenes NOT DETECTED NOT DETECTED Final   Acinetobacter baumannii NOT DETECTED NOT DETECTED Final   Enterobacteriaceae species NOT DETECTED NOT DETECTED Final   Enterobacter cloacae complex NOT DETECTED NOT DETECTED Final   Escherichia coli NOT DETECTED NOT DETECTED Final   Klebsiella oxytoca NOT DETECTED NOT DETECTED Final   Klebsiella pneumoniae NOT DETECTED NOT DETECTED Final   Proteus species NOT DETECTED NOT DETECTED Final   Serratia marcescens NOT DETECTED NOT DETECTED Final   Haemophilus influenzae NOT DETECTED NOT DETECTED Final   Neisseria meningitidis NOT DETECTED NOT DETECTED Final   Pseudomonas aeruginosa NOT DETECTED NOT DETECTED Final   Candida albicans DETECTED (A) NOT DETECTED Final    Comment: CRITICAL RESULT CALLED TO, READ BACK BY AND VERIFIED WITH: PHARMD L SEAY [id number] AT 720 AM BY CM    Candida glabrata NOT DETECTED NOT DETECTED Final   Candida krusei NOT DETECTED NOT DETECTED Final   Candida parapsilosis NOT DETECTED NOT DETECTED Final   Candida tropicalis NOT DETECTED NOT DETECTED Final    Comment: Performed at [facility], [place] [address], [place], [place] [address]   MRSA PCR Screening     Status: None   Collection Time: 08/20/18  4:04 AM   Specimen: Nasopharyngeal  Result Value Ref Range Status   MRSA by PCR NEGATIVE NEGATIVE Final    Comment:        The GeneXpert MRSA Assay (FDA approved for NASAL specimens only), is one component of a comprehensive MRSA colonization surveillance program. It is not intended to diagnose MRSA infection nor to guide or monitor treatment for MRSA infections. Performed at [facility], [place] [address], [place], [place] [address]   Urine culture     Status: None   Collection Time: 08/20/18  5:34 AM   Specimen: Urine, Random  Result Value Ref Range Status   Specimen Description URINE, RANDOM  Final   Special Requests NONE  Final   Culture   Final    NO GROWTH Performed at [facility], [place] [address] [place], [place] [address]    Report Status 08/21/2018 FINAL  Final     Terri Piedra, NP [place] [facility] [phone] Pager  08/21/2018  9:19 AM

## 2018-08-21 NOTE — Progress Notes (Addendum)
PHARMACY - ADULT TOTAL PARENTERAL NUTRITION CONSULT NOTE   Pharmacy Consult for TPN Indication: gastric adenocarcinoma s/p gastrectomy  Patient Measurements: Height: 5\' 7"  (170.2 cm) Weight: 154 lb 8.7 oz (70.1 kg) IBW/kg (Calculated) : 66.1 TPN AdjBW (KG): 69.7 Body mass index is 24.2 kg/m.   Assessment:  On chronic TPN from [facility]. Patient with gastric cancer and FTT, follows with palliative care as an outpatient. Admitted from nursing home with respiratory distress requiring intubation.   GI: Has been receiving TPN as outpatient. Last prealbumin was 16 from May. Endo: cbgs increased over last 24h to 200-400s, a1c = 10 Insulin requirements in the past 24 hours: 16 units SSI then converted to insulin gtt overnight Lytes: Na 147, phos 2, K 4.3 Renal: SCr 1.2, BUN elevated, has d5LR @ 75 Pulm: Vent Cards: VSS Hepatobil: AST/ALT 23/60, Tbili wnl, TG 217 Neuro: Sedated on vent, RASS -1 ID: Aspiration pna on Unasyn, fluconazole for candida albicans in BCx 6/26 - plan for line pull and holiday AC: Full dose Lovenox for recurrent VTEs  TPN Access: PICC (to be pulled) TPN start date: Continue from PTA Nutritional Goals (per RD recommendation on ): Awaiting recommendations kCal: Protein:  Fluid:  Current Nutrition:  Able to use OG tube for medications Full liquid diet  Home TPN: 280 g dextrose, 85 g amino acid, 70 g lipid for a total of 1992 kcal in 1800 mL  Plan:  Will hold new TPN bag today after discussion with ID team, plan for line to be pulled early AM and line holiday for 2-3 days F/u ID plan and ability to re-start TPN Give 20 mmol Kphos IV x1 Phos lvl AM On D5W/LR @ [place], PharmD Clinical Pharmacist Please check AMION for all [place] numbers 08/21/2018 10:49 AM

## 2018-08-21 NOTE — Progress Notes (Signed)
Patient started on trickle tube feeds at 20 ml/hr and TPN stopped per MD at 1430.  Patient vomited tube feeds at 4174 and MD notified.  Verbal order to turn tube feeds to 10 ml/hr and to continue to monitor.

## 2018-08-21 NOTE — Progress Notes (Signed)
PHARMACY - PHYSICIAN COMMUNICATION CRITICAL VALUE ALERT - BLOOD CULTURE IDENTIFICATION (BCID)  Timothy Garcia is an 84 y.o. male who presented to [facility] on 08[facility]2018 with a chief complaint of sepsis  Assessment:  1/4 BC positive for C albicans  Name of physician (or Provider) Contacted: Cristal Ford  Current antibiotics: vanc and cefepime  Changes to prescribed antibiotics recommended:  Ordered one dose fluconazole 800 mg IV.  F/u with ID  Results for orders placed or performed during the hospital encounter of 07/28/2018  Blood Culture ID Panel (Reflexed) (Collected: 08/16/2018 11:16 PM)  Result Value Ref Range   Enterococcus species NOT DETECTED NOT DETECTED   Listeria monocytogenes NOT DETECTED NOT DETECTED   Staphylococcus species NOT DETECTED NOT DETECTED   Staphylococcus aureus (BCID) NOT DETECTED NOT DETECTED   Streptococcus species NOT DETECTED NOT DETECTED   Streptococcus agalactiae NOT DETECTED NOT DETECTED   Streptococcus pneumoniae NOT DETECTED NOT DETECTED   Streptococcus pyogenes NOT DETECTED NOT DETECTED   Acinetobacter baumannii NOT DETECTED NOT DETECTED   Enterobacteriaceae species NOT DETECTED NOT DETECTED   Enterobacter cloacae complex NOT DETECTED NOT DETECTED   Escherichia coli NOT DETECTED NOT DETECTED   Klebsiella oxytoca NOT DETECTED NOT DETECTED   Klebsiella pneumoniae NOT DETECTED NOT DETECTED   Proteus species NOT DETECTED NOT DETECTED   Serratia marcescens NOT DETECTED NOT DETECTED   Haemophilus influenzae NOT DETECTED NOT DETECTED   Neisseria meningitidis NOT DETECTED NOT DETECTED   Pseudomonas aeruginosa NOT DETECTED NOT DETECTED   Candida albicans DETECTED (A) NOT DETECTED   Candida glabrata NOT DETECTED NOT DETECTED   Candida krusei NOT DETECTED NOT DETECTED   Candida parapsilosis NOT DETECTED NOT DETECTED   Candida tropicalis NOT DETECTED NOT DETECTED    Beverlee Nims 08/21/2018  7:38 AM

## 2018-08-21 NOTE — Progress Notes (Signed)
Initial Nutrition Assessment  DOCUMENTATION CODES:   Not applicable  INTERVENTION:   TPN line holiday  Initiate trickle TF Vital 1.5 @ 20 ml/hr via NG tube  (provides: 720 kcal, 32 grams protein, and 366 ml free water)  As able recommend advancing TF to goal rate of 40 ml/hr Add 60 ml Prostat BID  Provides: 1840 kcal, 124 grams protein, and 733 ml free water.    NUTRITION DIAGNOSIS:   Increased nutrient needs related to chronic illness(aspiration PNA, cancer) as evidenced by estimated needs.  GOAL:   Patient will meet greater than or equal to 90% of their needs  MONITOR:   Labs  REASON FOR ASSESSMENT:   Consult, Ventilator Assessment of nutrition requirement/status, Enteral/tube feeding initiation and management(High blood sugars)  ASSESSMENT:   Pt with PMH of COPD, DM, stomach cancer s/p distal gastrectomy with Roux-en-y on 3/5, unable to place J-tube due to narrow caliber of jejunum therefore no enteral access on chronic TPN, recurrent aspiration from SNF now admitted with hypoxic respiratory failure presumed due to aspiration.   Per Pharmacy TPN held for 2-3 day line holiday. Pt on fluconazole due to candida in blood cultures.   Per chart review it appears that after gastric surgery pt unable to tolerate liquids and started remained TPN, pt has no enteral access after surgery. Unknown why pt is not tolerating PO diet (dysphagia, altered GI function). Spoke with CCM will initiate trickle TF via NG tube and monitor tolerance.    Patient is currently intubated on ventilator support MV: 16.3 L/min Temp (24hrs), Avg:98.2 F (36.8 C), Min:97.8 F (36.6 C), Max:99 F (37.2 C)  18 F OG tube in place Medications reviewed and include: insulin drip  Labs reviewed: PO4: 2 (L) CBG's: [phone]  NUTRITION - FOCUSED PHYSICAL EXAM:  Deferred, RD working remotely   Diet Order:   Diet Order            Diet NPO time specified  Diet effective now               EDUCATION NEEDS:   No education needs have been identified at this time  Skin:  Skin Assessment: Reviewed RN Assessment  Last BM:  unknown  Height:   Ht Readings from Last 1 Encounters:  08/20/18 5\' 7"  (1.702 m)    Weight:   Wt Readings from Last 1 Encounters:  08/21/18 70.1 kg    Ideal Body Weight:  67.2 kg  BMI:  Body mass index is 24.2 kg/m.  Estimated Nutritional Needs:   Kcal:  3710  Protein:  105-125 grams  Fluid:  > 1.8 L/day  Maylon Peppers RD, LDN, CNSC [phone] Pager [phone] After Hours Pager

## 2018-08-21 NOTE — Progress Notes (Addendum)
Inpatient Diabetes Program Recommendations  AACE/ADA: New Consensus Statement on Inpatient Glycemic Control (2015)  Target Ranges:  Prepandial:   less than 140 mg/dL      Peak postprandial:   less than 180 mg/dL (1-2 hours)      Critically ill patients:  140 - 180 mg/dL   Lab Results  Component Value Date   GLUCAP 344 (H) 08/21/2018   HGBA1C 10.0 (H) 08/20/2018    Review of Glycemic Control Results for Timothy Garcia, Timothy Garcia (MRN [id number]) as of 08/21/2018 09:06  Ref. Range 08/21/2018 05:07 08/21/2018 05:55 08/21/2018 07:00 08/21/2018 08:12  Glucose-Capillary Latest Ref Range: 70 - 99 mg/dL 497 (H) 498 (H) 393 (H) 344 (H)   Diabetes history: Type 2 DM Outpatient Diabetes medications: Novolog 1-9 units TID, Levemir 32 units QD, TPN Current orders for Inpatient glycemic control: IV insulin  Inpatient Diabetes Program Recommendations:    Noted patient glucose trends significantly increased and now patient on glucostabilizer.   Of note, trends began to increase following start of TPN. This has no insulin within the bag and contains 70% Dextrose with a total 414 g. This is in addition to the D5% at 75 ml/hr patient is receiving. May be worthwhile to contact pharmacy for dosing insulin within TPN.   Addendum: Noted that TPN will be hold for several days. When ready to transition off glucostabilizer and while TPN on hold consider: Levemir 8 units two hours prior to the discontinuation of the drip, then Q24H following. Also, consider adding Novolog 1-3 units Q4H under the ICU protocol.  Will continue to follow.   Thanks, Bronson Curb, MSN, RNC-OB Diabetes Coordinator [phone] (8a-5p)

## 2018-08-21 NOTE — Progress Notes (Signed)
  Echocardiogram 2D Echocardiogram has been performed.  Timothy Garcia 08/21/2018, 4:20 PM

## 2018-08-22 LAB — CBC WITH DIFFERENTIAL/PLATELET
Abs Immature Granulocytes: 0.26 10*3/uL — ABNORMAL HIGH (ref 0.00–0.07)
Basophils Absolute: 0 10*3/uL (ref 0.0–0.1)
Basophils Relative: 0 %
Eosinophils Absolute: 0.1 10*3/uL (ref 0.0–0.5)
Eosinophils Relative: 1 %
HCT: 21.8 % — ABNORMAL LOW (ref 39.0–52.0)
Hemoglobin: 6.8 g/dL — CL (ref 13.0–17.0)
Immature Granulocytes: 1 %
Lymphocytes Relative: 8 %
Lymphs Abs: 1.7 10*3/uL (ref 0.7–4.0)
MCH: 25.4 pg — ABNORMAL LOW (ref 26.0–34.0)
MCHC: 31.2 g/dL (ref 30.0–36.0)
MCV: 81.3 fL (ref 80.0–100.0)
Monocytes Absolute: 1.4 10*3/uL — ABNORMAL HIGH (ref 0.1–1.0)
Monocytes Relative: 6 %
Neutro Abs: 18 10*3/uL — ABNORMAL HIGH (ref 1.7–7.7)
Neutrophils Relative %: 84 %
Platelets: 346 10*3/uL (ref 150–400)
RBC: 2.68 MIL/uL — ABNORMAL LOW (ref 4.22–5.81)
RDW: 18.2 % — ABNORMAL HIGH (ref 11.5–15.5)
WBC: 21.5 10*3/uL — ABNORMAL HIGH (ref 4.0–10.5)
nRBC: 0.3 % — ABNORMAL HIGH (ref 0.0–0.2)

## 2018-08-22 LAB — COMPREHENSIVE METABOLIC PANEL
ALT: 51 U/L — ABNORMAL HIGH (ref 0–44)
AST: 39 U/L (ref 15–41)
Albumin: 1.4 g/dL — ABNORMAL LOW (ref 3.5–5.0)
Alkaline Phosphatase: 426 U/L — ABNORMAL HIGH (ref 38–126)
Anion gap: 12 (ref 5–15)
BUN: 34 mg/dL — ABNORMAL HIGH (ref 8–23)
CO2: 18 mmol/L — ABNORMAL LOW (ref 22–32)
Calcium: 8.1 mg/dL — ABNORMAL LOW (ref 8.9–10.3)
Chloride: 119 mmol/L — ABNORMAL HIGH (ref 98–111)
Creatinine, Ser: 1.26 mg/dL — ABNORMAL HIGH (ref 0.61–1.24)
GFR calc Af Amer: >60 mL/min (ref >60)
GFR calc non Af Amer: 53 mL/min — ABNORMAL LOW (ref >60)
Glucose, Bld: 126 mg/dL — ABNORMAL HIGH (ref 70–99)
Potassium: 4 mmol/L (ref 3.5–5.1)
Sodium: 149 mmol/L — ABNORMAL HIGH (ref 135–145)
Total Bilirubin: 0.5 mg/dL (ref 0.3–1.2)
Total Protein: 6 g/dL — ABNORMAL LOW (ref 6.5–8.1)

## 2018-08-22 LAB — MAGNESIUM: Magnesium: 2 mg/dL (ref 1.7–2.4)

## 2018-08-22 LAB — GLUCOSE, CAPILLARY
Glucose-Capillary: 98 mg/dL (ref 70–99)
Glucose-Capillary: 137 mg/dL — ABNORMAL HIGH (ref 70–99)
Glucose-Capillary: 142 mg/dL — ABNORMAL HIGH (ref 70–99)
Glucose-Capillary: 129 mg/dL — ABNORMAL HIGH (ref 70–99)
Glucose-Capillary: 190 mg/dL — ABNORMAL HIGH (ref 70–99)
Glucose-Capillary: 180 mg/dL — ABNORMAL HIGH (ref 70–99)

## 2018-08-22 LAB — PHOSPHORUS: Phosphorus: 2.7 mg/dL (ref 2.5–4.6)

## 2018-08-22 MED ORDER — CHLORHEXIDINE GLUCONATE CLOTH 2 % EX PADS
6.0000 | MEDICATED_PAD | Freq: Every day | CUTANEOUS | Status: DC
  Administered 2018-08-23 – 2018-09-04 (×13): 6 via TOPICAL

## 2018-08-22 NOTE — Progress Notes (Signed)
NAME:  Timothy Garcia, MRN:  [id number], DOB:  29-Jun-1934, LOS: 2 ADMISSION DATE:  07/31/2018, CONSULTATION DATE:  08/20/18 REFERRING MD:  Betsey Holiday  CHIEF COMPLAINT:  SOB   Brief History   Timothy Garcia is a 84 y.o. male who resides at SNF and has hx recurrent aspiration, admitted 6/24 with hypoxic respiratory failure presumed due to aspiration.  Required intubation in ED.   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  Significant Hospital Events   6/25 > admit.  Intubated, culture sent, IV fluids started, antibiotics initiated  Consults:  None.  Procedures:  ETT 6/24 >   Significant Diagnostic Tests:  CXR 6/24 > bilateral opacities R > L with bibasilar atelectasis.  Micro Data:  Blood 6/24 >  Sputum 6/24 >  Urine 6/24 >  U. Strep 6/25 > negative U. Legionella 6/25 >  SARS CoV2 6/24 > negative.  Antimicrobials:  Vanc 6/24 >  Cefepime 6/24 >    Interim history/subjective:  More awake, hemodynamically stable. Tachypnea with SBT  Objective:  Blood pressure (!) 132/47, pulse 100, temperature 97.7 F (36.5 C), temperature source Axillary, resp. rate (!) 28, height 5\' 7"  (1.702 m), weight 72.3 kg, SpO2 100 %.    Vent Mode: PRVC FiO2 (%):  [40 %] 40 % Set Rate:  [28 bmp] 28 bmp Vt Set:  [50 mL-520 mL] 520 mL PEEP:  [8 cmH20] 8 cmH20 Plateau Pressure:  [15 cmH20-30 cmH20] 30 cmH20   Intake/Output Summary (Last 24 hours) at 08/22/2018 1200 Last data filed at 08/22/2018 0700 Gross per 24 hour  Intake 2131.41 ml  Output 1625 ml  Net 506.41 ml   Filed Weights   08/20/18 0408 08/21/18 0500 08/22/18 0500  Weight: 69.7 kg 70.1 kg 72.3 kg    Examination: General: NAD HEENT temporal wasting, ETT in place, poor dentition  Pulmonary: Coarse and diffuse rhonchi no accessory use on mechanically assisted breath Cardiac: Rapid  irregular heart rhythm atrial fibrillation appreciated on telemetry Abdomen: Soft, midabdominal incision well-healed.  Hypoactive bowel sounds.  No pain Extremities: Warm and dry does have some dependent edema/anasarca.  Pulses are palpable Neuro: Awake, tries to interact, will follow commands.  Tremulous both upper extremities. GU: Clear yellow  Assessment & Plan:   Critically ill due to acute hypoxic respiratory failure requiring intubation - presumed due to aspiration especially in light of underlying hx of recurrent aspiration. Portable chest x-ray personally reviewed demonstrating predominantly right-sided airspace disease endotracheal tubes in satisfactory position Plan Continue full ventilator support Daily weaning trials, maybe ready for extubation soon.  Aspiration pneumonia  Continue IV vancomycin and cefepime day #2 Await current pending culture data  Hx COPD.  Severe with FEV1 less than 30%.  No current evidence of exacerbation Plan Continue bronchodilators  Atrial fibrillation with episodes of RVR currently blood pressure within goal Plan Avoid hypokalemia and hypomagnesemia Treat pneumonia Already on Lovenox  Hx DVTs / PE - s/p IVC filter and on lovenox. Plan Continuing preadmission low molecular weight heparin  AKI: Suspect ATN Plan Avoid hypotension Renal dose medications Strict intake output A.m. chemistry  C albicans fungemia PICC for chronic TPN most likely source. Continue fluconazole. Remove PICC  Repeat blood cultures once PICC out to ensure sterilization of blood.  Non-anion gap metabolic acidosis in the setting of hyperchloremia Plan Adjusting minute ventilation Follow CMP, CBC  Hx DM. Episode of  hypoglycemia Plan Trickle feeds. Sensitive sliding scale insulin Hold any basal insulin for now  Hx gastric adenocarcinoma - on chronic TPN.  Tolerating trickle feeds. Stop TPN due to fungemia.   Failure to thrive. He has been followed by  palliative care in the outpatient setting.  Review of their notes from 6/5 demonstrate ongoing weight loss, poor activity tolerance, also suggests fairly poor insight to his prognosis Plan We will ask palliative to continue supportive care while here in-house  Best Practice:  Diet: trickle feeds. Pain/Anxiety/Delirium protocol (if indicated): Fentanyl PRN / Midazolam PRN.  RASS goal 0 to -1. VAP protocol (if indicated): In place. DVT prophylaxis: SCD's / Lovenox. GI prophylaxis: PPI. Glucose control: SSI. Mobility: Bedrest. Code Status: Full. Family Communication: None available. Disposition: ICU. Ongoing palliative care discussions.   CRITICAL CARE Performed by: Kipp Brood   Total critical care time: 35 minutes  Critical care time was exclusive of separately billable procedures and treating other patients.  Critical care was necessary to treat or prevent imminent or life-threatening deterioration.  Critical care was time spent personally by me on the following activities: development of treatment plan with patient and/or surrogate as well as nursing, discussions with consultants, evaluation of patient's response to treatment, examination of patient, obtaining history from patient or surrogate, ordering and performing treatments and interventions, ordering and review of laboratory studies, ordering and review of radiographic studies, pulse oximetry, re-evaluation of patient's condition and participation in multidisciplinary rounds.  Kipp Brood, MD [facility] ICU Physician [facility]  Pager: [phone] Mobile: [phone] After hours: [phone].

## 2018-08-22 NOTE — Progress Notes (Signed)
Daily Progress Note   Patient Name: Timothy Garcia       Date: 08/22/2018 DOB: [birth year]  Age: 84 y.o. MRN#: [id number] Attending Physician: Roxanne Mins, MD Primary Care Physician: Rogers Blocker, MD Admit Date: 08/07/2018  Reason for Consultation/Follow-up: Establishing goals of care  Subjective: Patient is resting in bed, opens eyes when his name is called.  Does not appear as alert/interactive as yesterday. Nods head and blinks eyes appropriately In mild distress, tachycardic on monitor.  He is on the vent Has orogastric tube, chart review notes did not tolerate trickle tube feeds well.  Now also on fluconazole due to candida in blood cultures.  Final blood cultures still pending  Length of Stay: 2  Current Medications: Scheduled Meds:  . chlorhexidine gluconate (MEDLINE KIT)  15 mL Mouth Rinse BID  . Chlorhexidine Gluconate Cloth  6 each Topical Daily  . enoxaparin (LOVENOX) injection  80 mg Subcutaneous Q24H  . insulin aspart  1 Units Subcutaneous Q4H  . insulin aspart  2-6 Units Subcutaneous Q4H  . insulin detemir  5 Units Subcutaneous Q12H  . ipratropium-albuterol  3 mL Nebulization Q6H  . mouth rinse  15 mL Mouth Rinse 10 times per day  . pantoprazole (PROTONIX) IV  40 mg Intravenous QHS  . sodium chloride flush  3 mL Intravenous Once    Continuous Infusions: . ampicillin-sulbactam (UNASYN) IV Stopped (08/22/18 0343)  . dextrose    . feeding supplement (VITAL 1.5 CAL) 10 mL/hr at 08/21/18 1600  . fluconazole (DIFLUCAN) IV    . lactated ringers 50 mL/hr at 08/22/18 0700    PRN Meds: albuterol, dextrose, fentaNYL (SUBLIMAZE) injection, fentaNYL (SUBLIMAZE) injection, midazolam, sodium chloride flush  Physical Exam         Has ETT Has OGT Resting in bed Coarse  breath sounds Irregular and tachycardic Dependent edema Awake alert, nods and blinks appropriately  Vital Signs: BP (!) 119/45   Pulse 100   Temp 97.7 F (36.5 C) (Axillary)   Resp (!) 28   Ht _0  (1.702 m)   Wt 72.3 kg   SpO2 100%   BMI 24.96 kg/m  SpO2: SpO2: 100 % O2 Device: O2 Device: Ventilator O2 Flow Rate: O2 Flow Rate (L/min): 15 L/min  Intake/output summary:   Intake/Output Summary (Last 24 hours) at 08/22/2018  [place] filed at 08/22/2018 0700 Gross per 24 hour  Intake 2438.72 ml  Output 1625 ml  Net 813.72 ml   LBM:   Baseline Weight: Weight: 73.1 kg Most recent weight: Weight: 72.3 kg       Palliative Assessment/Data:    Flowsheet Rows     Most Recent Value  Intake Tab  Referral Department  Critical care  Unit at Time of Referral  ICU  Palliative Care Primary Diagnosis  Cancer  Palliative Care Type  New Palliative care  Reason for referral  Clarify Goals of Care  Date first seen by Palliative Care  08/20/18  Clinical Assessment  Palliative Performance Scale Score  20%  Pain Max last 24 hours  4  Pain Min Last 24 hours  3  Dyspnea Max Last 24 Hours  4  Dyspnea Min Last 24 hours  3  Psychosocial & Spiritual Assessment  Palliative Care Outcomes      Patient Active Problem List   Diagnosis Date Noted  . Acute hypoxemic respiratory failure ([place]) 08/20/2018  . Aspiration pneumonia of both lower lobes ([place])   . Palliative care by specialist   . Goals of care, counseling/discussion   . Sepsis ([place]) 07/16/2018  . Hyperglycemia 07/16/2018  . Benign essential HTN 07/16/2018  . AMS (altered mental status) 07/16/2018  . COPD with acute exacerbation ([place]) 07/16/2018  . CKD (chronic kidney disease) stage 3, GFR 30-59 ml/min ([facility]) 07/16/2018  . Emphysema of lung ([place]) 07/16/2018  . NSVT (nonsustained ventricular tachycardia) ([place]) 07/16/2018    Palliative Care Assessment & Plan   Patient Profile:    Assessment:  Timothy Garcia a 84  y.o.malewho resides at [facility] and has hx recurrent aspiration, admitted 6/24 with hypoxic respiratory failure presumed due to aspiration. Required intubation in ED. 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  The patient remains intubated, PMT consult for ongoing goals of care discussions.   Tachycardic On the vent More awake alert  Has candida in blood, pharmacy following, was already on vanc cefepime, now also started on fluconazole.  PMT following for goals of care discussions, as per out patient palliative care note, patient had endorsed full code, full scope treatment, this is his HCPOA sister's understanding as well, she is Ms Sharlet Salina who lives locally.  Patient also has children in [place], noted daughter's number from out patient palliative note, call placed but unable to reach daughter Charlynn Court at [phone]. Goes straight to voicemail and mailbox is full, unable to reach a message.  I re-attempted to call daughter again this a.m. 6-27 without success.   Recommendations/Plan:  PMT to continue to follow over the weekend.   Monitor hospital course, patient's disease trajectory, ability to tolerate weaning trials. Remains on anti fungal and broad spectrum antibiotics.  2D echo also done.  No definitive evidence of vegetation.  Call placed and discussed again with sister Ms. Crawford over the phone.  I gave her clinical updates about the patient's current condition.  Discussed frankly about the patient's functional decline, poor nutritional status, recent surgery for gastric cancer prior to this particular episode of hypoxic respiratory failure deemed secondary to aspiration, now also found to have Candida in the blood.  Patient sister simply asks, " do you think he will ever get better ?"  I shared with her my concern  that  the patient remains at high risk for ongoing decline and decompensation.  Discussed with her that for now, all measures are being taken for life maintenance/life prolongation.  Discussed that after a time trial of current interventions over the course of the next few days, if there is no significant meaningful recovery, then we will have to reengage in goals of care discussions, at that time, we will have to discuss further about focusing more on comfort measures, considering withdrawal of ventilatory support, establishing limits on care such as DO NOT RESUSCITATE etc.  She states that she is trying to give information to the patient's children who live in [place] and is keeping them informed.  She is thankful for information obtained today and requests for ongoing communication with as many hospital staff members as possible so that she can be better informed, and so that she can better relay the information to his children.   Goals of Care and Additional Recommendations:  Limitations on Scope of Treatment: Full Scope Treatment  Code Status:    Code Status Orders  (From admission, onward)         Start     Ordered   08/20/18 0153  Full code  Continuous     08/20/18 0154        Code Status History    Date Active Date Inactive Code Status Order ID Comments User Context   07/16/2018 2113 07/21/2018 2110 Full Code [id number]  Elwyn Reach, MD Inpatient   05/20/2018 2308 06/29/2018 1744 Full Code [id number]  Horald Chestnut Inpatient   Advance Care Planning Activity       Prognosis:   Unable to determine  Discharge Planning:  To Be Determined  Care plan was discussed with patient  Who is still intubated, but somwhat awake alert and interactive, he nods his head yes/no and attempts to follow the conversation.   Call placed and updated sister.   Thank you for allowing the Palliative Medicine Team to assist in the care of this patient.   Time In:  10.00 Time Out: 10.35 Total Time  35 Prolonged Time Billed  no       Greater than 50%  of this time was spent counseling and coordinating care related to the above assessment and plan. PPS 30% Loistine Chance, MD [phone] Please contact Palliative Medicine Team phone at [phone] for questions and concerns.

## 2018-08-22 NOTE — Progress Notes (Signed)
BCx 6-24 yeast Will repeat BCx Asked lab check fluconazole sensi

## 2018-08-23 LAB — CBC WITH DIFFERENTIAL/PLATELET
Abs Immature Granulocytes: 0.19 10*3/uL — ABNORMAL HIGH (ref 0.00–0.07)
Basophils Absolute: 0.1 10*3/uL (ref 0.0–0.1)
Basophils Relative: 0 %
Eosinophils Absolute: 0.1 10*3/uL (ref 0.0–0.5)
Eosinophils Relative: 1 %
HCT: 21.1 % — ABNORMAL LOW (ref 39.0–52.0)
Hemoglobin: 6.6 g/dL — CL (ref 13.0–17.0)
Immature Granulocytes: 1 %
Lymphocytes Relative: 6 %
Lymphs Abs: 1.1 10*3/uL (ref 0.7–4.0)
MCH: 25 pg — ABNORMAL LOW (ref 26.0–34.0)
MCHC: 31.3 g/dL (ref 30.0–36.0)
MCV: 79.9 fL — ABNORMAL LOW (ref 80.0–100.0)
Monocytes Absolute: 1 10*3/uL (ref 0.1–1.0)
Monocytes Relative: 5 %
Neutro Abs: 16.5 10*3/uL — ABNORMAL HIGH (ref 1.7–7.7)
Neutrophils Relative %: 87 %
Platelets: 335 10*3/uL (ref 150–400)
RBC: 2.64 MIL/uL — ABNORMAL LOW (ref 4.22–5.81)
RDW: 18.2 % — ABNORMAL HIGH (ref 11.5–15.5)
WBC: 18.9 10*3/uL — ABNORMAL HIGH (ref 4.0–10.5)
nRBC: 0.5 % — ABNORMAL HIGH (ref 0.0–0.2)

## 2018-08-23 LAB — COMPREHENSIVE METABOLIC PANEL
ALT: 51 U/L — ABNORMAL HIGH (ref 0–44)
AST: 37 U/L (ref 15–41)
Albumin: 1.4 g/dL — ABNORMAL LOW (ref 3.5–5.0)
Alkaline Phosphatase: 446 U/L — ABNORMAL HIGH (ref 38–126)
Anion gap: 5 (ref 5–15)
BUN: 29 mg/dL — ABNORMAL HIGH (ref 8–23)
CO2: 22 mmol/L (ref 22–32)
Calcium: 8.1 mg/dL — ABNORMAL LOW (ref 8.9–10.3)
Chloride: 128 mmol/L — ABNORMAL HIGH (ref 98–111)
Creatinine, Ser: 1.26 mg/dL — ABNORMAL HIGH (ref 0.61–1.24)
GFR calc Af Amer: >60 mL/min (ref >60)
GFR calc non Af Amer: 53 mL/min — ABNORMAL LOW (ref >60)
Glucose, Bld: 128 mg/dL — ABNORMAL HIGH (ref 70–99)
Potassium: 4.2 mmol/L (ref 3.5–5.1)
Sodium: 155 mmol/L — ABNORMAL HIGH (ref 135–145)
Total Bilirubin: 0.4 mg/dL (ref 0.3–1.2)
Total Protein: 6.1 g/dL — ABNORMAL LOW (ref 6.5–8.1)

## 2018-08-23 LAB — GLUCOSE, CAPILLARY
Glucose-Capillary: 105 mg/dL — ABNORMAL HIGH (ref 70–99)
Glucose-Capillary: 118 mg/dL — ABNORMAL HIGH (ref 70–99)
Glucose-Capillary: 135 mg/dL — ABNORMAL HIGH (ref 70–99)
Glucose-Capillary: 139 mg/dL — ABNORMAL HIGH (ref 70–99)
Glucose-Capillary: 173 mg/dL — ABNORMAL HIGH (ref 70–99)
Glucose-Capillary: 98 mg/dL (ref 70–99)

## 2018-08-23 LAB — MAGNESIUM
Magnesium: 2 mg/dL (ref 1.7–2.4)
Magnesium: 2 mg/dL (ref 1.7–2.4)

## 2018-08-23 LAB — CBC
HCT: 23.8 % — ABNORMAL LOW (ref 39.0–52.0)
Hemoglobin: 7.6 g/dL — ABNORMAL LOW (ref 13.0–17.0)
MCH: 25.9 pg — ABNORMAL LOW (ref 26.0–34.0)
MCHC: 31.9 g/dL (ref 30.0–36.0)
MCV: 81.2 fL (ref 80.0–100.0)
Platelets: 342 10*3/uL (ref 150–400)
RBC: 2.93 MIL/uL — ABNORMAL LOW (ref 4.22–5.81)
RDW: 18.1 % — ABNORMAL HIGH (ref 11.5–15.5)
WBC: 18.9 10*3/uL — ABNORMAL HIGH (ref 4.0–10.5)
nRBC: 0.5 % — ABNORMAL HIGH (ref 0.0–0.2)

## 2018-08-23 LAB — PHOSPHORUS
Phosphorus: 2.4 mg/dL — ABNORMAL LOW (ref 2.5–4.6)
Phosphorus: 2.5 mg/dL (ref 2.5–4.6)

## 2018-08-23 LAB — PREPARE RBC (CROSSMATCH)

## 2018-08-23 MED ORDER — SODIUM CHLORIDE 0.9% IV SOLUTION: Freq: Once | INTRAVENOUS | Status: DC

## 2018-08-23 NOTE — Progress Notes (Signed)
PHARMACY - PHYSICIAN COMMUNICATION CRITICAL VALUE ALERT - BLOOD CULTURE IDENTIFICATION (BCID)  Timothy Garcia is an 84 y.o. male who presented to [facility] on 08/18/2018 with a chief complaint of respiratory failure presumed secondary to aspiration.  Assessment:  Pt has known + blood cx for candida albicans and is on Diflucan.  Updated cx now growing gram positive rods in 1/4 bottles.  BCID not routinely performed on GPR as likely contaminant.    Name of physician (or Provider) Contacted: Dr. Maryjean Ka via secure chat message  Current antibiotics: Unasyn and Fluconazole  Changes to prescribed antibiotics recommended:  None at this time.  Results for orders placed or performed during the hospital encounter of 08/11/2018  Blood Culture ID Panel (Reflexed) (Collected: 08/22/2018 11:16 PM)  Result Value Ref Range   Enterococcus species NOT DETECTED NOT DETECTED   Listeria monocytogenes NOT DETECTED NOT DETECTED   Staphylococcus species NOT DETECTED NOT DETECTED   Staphylococcus aureus (BCID) NOT DETECTED NOT DETECTED   Streptococcus species NOT DETECTED NOT DETECTED   Streptococcus agalactiae NOT DETECTED NOT DETECTED   Streptococcus pneumoniae NOT DETECTED NOT DETECTED   Streptococcus pyogenes NOT DETECTED NOT DETECTED   Acinetobacter baumannii NOT DETECTED NOT DETECTED   Enterobacteriaceae species NOT DETECTED NOT DETECTED   Enterobacter cloacae complex NOT DETECTED NOT DETECTED   Escherichia coli NOT DETECTED NOT DETECTED   Klebsiella oxytoca NOT DETECTED NOT DETECTED   Klebsiella pneumoniae NOT DETECTED NOT DETECTED   Proteus species NOT DETECTED NOT DETECTED   Serratia marcescens NOT DETECTED NOT DETECTED   Haemophilus influenzae NOT DETECTED NOT DETECTED   Neisseria meningitidis NOT DETECTED NOT DETECTED   Pseudomonas aeruginosa NOT DETECTED NOT DETECTED   Candida albicans DETECTED (A) NOT DETECTED   Candida glabrata NOT DETECTED NOT DETECTED   Candida krusei NOT DETECTED NOT  DETECTED   Candida parapsilosis NOT DETECTED NOT DETECTED   Candida tropicalis NOT DETECTED NOT DETECTED    Sharnelle Cappelli, [place] 08/23/2018  4:31 PM

## 2018-08-23 NOTE — Progress Notes (Signed)
NAME:  Timothy Garcia, MRN:  [id number], DOB:  1934/12/03, LOS: 3 ADMISSION DATE:  07/27/2018, CONSULTATION DATE:  08/20/18 REFERRING MD:  Betsey Holiday  CHIEF COMPLAINT:  SOB   Brief History   Timothy Garcia is a 84 y.o. male who resides at SNF and has hx recurrent aspiration, admitted 6/24 with hypoxic respiratory failure presumed due to aspiration.  Required intubation in ED.   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  Significant Hospital Events   6/25 > admit.  Intubated, culture sent, IV fluids started, antibiotics initiated  Consults:  None.  Procedures:  ETT 6/24 >   Significant Diagnostic Tests:  CXR 6/24 > bilateral opacities R > L with bibasilar atelectasis.  Micro Data:  Blood 6/24 >  Sputum 6/24 >  Urine 6/24 >  U. Strep 6/25 > negative U. Legionella 6/25 >  SARS CoV2 6/24 > negative.  Antimicrobials:  Vanc 6/24 >  Cefepime 6/24 >    Interim history/subjective:  More awake, interactive. Hemodynamically stable. Tachypnea with SBT yesterday. PICC out yesterday.   Objective:  Blood pressure 123/67, pulse (!) 101, temperature 98.2 F (36.8 C), temperature source Axillary, resp. rate (!) 24, height 5\' 7"  (1.702 m), weight 71 kg, SpO2 99 %.    Vent Mode: PRVC FiO2 (%):  [40 %] 40 % Set Rate:  [28 bmp] 28 bmp Vt Set:  [520 mL] 520 mL PEEP:  [8 cmH20] 8 cmH20 Plateau Pressure:  [19 cmH20-30 cmH20] 19 cmH20   Intake/Output Summary (Last 24 hours) at 08/23/2018 0923 Last data filed at 08/23/2018 0800 Gross per 24 hour  Intake 1720.48 ml  Output 600 ml  Net 1120.48 ml   Filed Weights   08/21/18 0500 08/22/18 0500 08/23/18 0500  Weight: 70.1 kg 72.3 kg 71 kg    Examination: General: NAD HEENT temporal wasting, ETT in place, poor dentition  Pulmonary: Coarse and diffuse rhonchi no accessory use on mechanically  assisted breath Cardiac: irregularly irregular, rate variable, no r/m/g Abdomen: SNDNT. midabdominal incision well-healed.  Hypoactive bowel sounds.  No pain Extremities: Warm and dry does have some dependent edema/anasarca.  Pulses are palpable Neuro: Awake, tries to interact, will follow commands.  Somewhat deconditioned, but MAE against gravity GU: Clear yellow  Assessment & Plan:   Critically ill due to acute hypoxic respiratory failure requiring intubation - presumed due to aspiration especially in light of underlying hx of recurrent aspiration. Portable chest x-ray personally reviewed demonstrating predominantly right-sided airspace disease endotracheal tubes in satisfactory position Plan Continue full ventilator support Daily weaning trials, maybe ready for extubation soon.  Aspiration pneumonia  Continue IV vancomycin and cefepime day #2 Await current pending culture data  Hx COPD.  Severe with FEV1 less than 30%.  No current evidence of exacerbation Plan Continue bronchodilators  Anemia: Repeat labs pending. Pt asymptomatic but given mult comorbidities will transfuse for Hgb <7.  Atrial fibrillation with episodes of RVR currently blood pressure within goal Plan Avoid hypokalemia and hypomagnesemia Treat pneumonia Already on Lovenox  Hx DVTs / PE - s/p IVC filter and on lovenox. Plan Continuing preadmission low molecular weight heparin  AKI: Suspect ATN Plan Avoid hypotension Renal dose medications Strict intake output A.m. chemistry  C albicans fungemia PICC for chronic TPN most likely source. Removed 6/27 Continue fluconazole. Remove PICC  Repeat blood cultures once PICC out to ensure sterilization of blood.  Non-anion gap metabolic acidosis in the setting of hyperchloremia Plan Adjusting minute ventilation Follow CMP, CBC  Hx DM. Episode of hypoglycemia Plan Trickle feeds continue; pt tolerating 20cc/hr. Increase to 25-->30cc/hr as tolerated  Sensitive sliding scale insulin Hold any basal insulin for now  Hx gastric adenocarcinoma - on chronic TPN.  Tolerating trickle feeds. Stop TPN due to fungemia.   Failure to thrive. He has been followed by palliative care in the outpatient setting.  Review of their notes from 6/5 demonstrate ongoing weight loss, poor activity tolerance, also suggests fairly poor insight to his prognosis Plan We will ask palliative to continue supportive care while here in-house  Best Practice:  Diet: trickle feeds. Pain/Anxiety/Delirium protocol (if indicated): Fentanyl PRN / Midazolam PRN.  RASS goal 0 to -1. VAP protocol (if indicated): In place. DVT prophylaxis: SCD's / Lovenox. GI prophylaxis: PPI. Glucose control: SSI. Mobility: Bedrest. Code Status: Full. Family Communication: None available. Disposition: ICU. Ongoing palliative care discussions.   CRITICAL CARE Performed by: Bonna Gains   Total critical care time: 35 minutes  Critical care time was exclusive of separately billable procedures and treating other patients.  Critical care was necessary to treat or prevent imminent or life-threatening deterioration.  Critical care was time spent personally by me on the following activities: development of treatment plan with patient and/or surrogate as well as nursing, discussions with consultants, evaluation of patient's response to treatment, examination of patient, obtaining history from patient or surrogate, ordering and performing treatments and interventions, ordering and review of laboratory studies, ordering and review of radiographic studies, pulse oximetry, re-evaluation of patient's condition and participation in multidisciplinary rounds.

## 2018-08-24 ENCOUNTER — Inpatient Hospital Stay (HOSPITAL_COMMUNITY): Payer: Medicare Other

## 2018-08-24 DIAGNOSIS — M7989 Other specified soft tissue disorders: Secondary | ICD-10-CM

## 2018-08-24 LAB — COMPREHENSIVE METABOLIC PANEL
ALT: 43 U/L (ref 0–44)
AST: 31 U/L (ref 15–41)
Albumin: 1.3 g/dL — ABNORMAL LOW (ref 3.5–5.0)
Alkaline Phosphatase: 419 U/L — ABNORMAL HIGH (ref 38–126)
Anion gap: 10 (ref 5–15)
BUN: 24 mg/dL — ABNORMAL HIGH (ref 8–23)
CO2: 22 mmol/L (ref 22–32)
Calcium: 8 mg/dL — ABNORMAL LOW (ref 8.9–10.3)
Chloride: 125 mmol/L — ABNORMAL HIGH (ref 98–111)
Creatinine, Ser: 1.3 mg/dL — ABNORMAL HIGH (ref 0.61–1.24)
GFR calc Af Amer: 59 mL/min — ABNORMAL LOW (ref >60)
GFR calc non Af Amer: 51 mL/min — ABNORMAL LOW (ref >60)
Glucose, Bld: 128 mg/dL — ABNORMAL HIGH (ref 70–99)
Potassium: 3.9 mmol/L (ref 3.5–5.1)
Sodium: 157 mmol/L — ABNORMAL HIGH (ref 135–145)
Total Bilirubin: 0.4 mg/dL (ref 0.3–1.2)
Total Protein: 6.3 g/dL — ABNORMAL LOW (ref 6.5–8.1)

## 2018-08-24 LAB — BPAM RBC
Blood Product Expiration Date: 202007282359
ISSUE DATE / TIME: 202006281431
Unit Type and Rh: 5100

## 2018-08-24 LAB — CBC WITH DIFFERENTIAL/PLATELET
Abs Immature Granulocytes: 0.17 10*3/uL — ABNORMAL HIGH (ref 0.00–0.07)
Basophils Absolute: 0 10*3/uL (ref 0.0–0.1)
Basophils Relative: 0 %
Eosinophils Absolute: 0.1 10*3/uL (ref 0.0–0.5)
Eosinophils Relative: 1 %
HCT: 24.4 % — ABNORMAL LOW (ref 39.0–52.0)
Hemoglobin: 7.7 g/dL — ABNORMAL LOW (ref 13.0–17.0)
Immature Granulocytes: 1 %
Lymphocytes Relative: 9 %
Lymphs Abs: 1.5 10*3/uL (ref 0.7–4.0)
MCH: 25.6 pg — ABNORMAL LOW (ref 26.0–34.0)
MCHC: 31.6 g/dL (ref 30.0–36.0)
MCV: 81.1 fL (ref 80.0–100.0)
Monocytes Absolute: 0.9 10*3/uL (ref 0.1–1.0)
Monocytes Relative: 6 %
Neutro Abs: 14.2 10*3/uL — ABNORMAL HIGH (ref 1.7–7.7)
Neutrophils Relative %: 83 %
Platelets: 342 10*3/uL (ref 150–400)
RBC: 3.01 MIL/uL — ABNORMAL LOW (ref 4.22–5.81)
RDW: 18.2 % — ABNORMAL HIGH (ref 11.5–15.5)
WBC: 16.8 10*3/uL — ABNORMAL HIGH (ref 4.0–10.5)
nRBC: 0.4 % — ABNORMAL HIGH (ref 0.0–0.2)

## 2018-08-24 LAB — GLUCOSE, CAPILLARY
Glucose-Capillary: 122 mg/dL — ABNORMAL HIGH (ref 70–99)
Glucose-Capillary: 106 mg/dL — ABNORMAL HIGH (ref 70–99)
Glucose-Capillary: 131 mg/dL — ABNORMAL HIGH (ref 70–99)
Glucose-Capillary: 100 mg/dL — ABNORMAL HIGH (ref 70–99)
Glucose-Capillary: 104 mg/dL — ABNORMAL HIGH (ref 70–99)
Glucose-Capillary: 121 mg/dL — ABNORMAL HIGH (ref 70–99)

## 2018-08-24 LAB — PREALBUMIN: Prealbumin: 5.9 mg/dL — ABNORMAL LOW (ref 18–38)

## 2018-08-24 LAB — TYPE AND SCREEN
ABO/RH(D): O POS
Antibody Screen: NEGATIVE
Unit division: 0

## 2018-08-24 LAB — TRIGLYCERIDES: Triglycerides: 188 mg/dL — ABNORMAL HIGH (ref <150)

## 2018-08-24 LAB — MAGNESIUM: Magnesium: 2 mg/dL (ref 1.7–2.4)

## 2018-08-24 LAB — PHOSPHORUS: Phosphorus: 2.5 mg/dL (ref 2.5–4.6)

## 2018-08-24 MED ORDER — BETHANECHOL CHLORIDE 10 MG PO TABS
10.0000 mg | ORAL_TABLET | Freq: Three times a day (TID) | ORAL | Status: DC
  Administered 2018-08-24 – 2018-09-04 (×32): 10 mg
  Filled 2018-08-24 (×37): qty 1

## 2018-08-24 MED ORDER — FREE WATER: 200.0000 mL | Freq: Four times a day (QID) | Status: DC

## 2018-08-24 MED ORDER — ENOXAPARIN SODIUM 80 MG/0.8ML ~~LOC~~ SOLN
70.0000 mg | SUBCUTANEOUS | Status: DC
  Administered 2018-08-25: 70 mg via SUBCUTANEOUS
  Filled 2018-08-24: qty 0.8

## 2018-08-24 MED ORDER — FREE WATER
200.0000 mL | Freq: Four times a day (QID) | Status: DC
  Administered 2018-08-24 – 2018-08-25 (×4): 200 mL

## 2018-08-24 NOTE — Progress Notes (Signed)
Daily Progress Note   Patient Name: Timothy Garcia       Date: 08/24/2018 DOB: 02/06/1935  Age: 84 y.o. MRN#: [id number] Attending Physician: Roxanne Mins, MD Primary Care Physician: Rogers Blocker, MD Admit Date: 08/05/2018  Reason for Consultation/Follow-up: Establishing goals of care  Subjective: Patient is resting in bed, opens eyes when his name is called.   Nods head and blinks eyes appropriately, follows commands.  He is on the vent On tube feeds  Length of Stay: 4  Current Medications: Scheduled Meds:  . sodium chloride   Intravenous Once  . bethanechol  10 mg Per Tube TID  . chlorhexidine gluconate (MEDLINE KIT)  15 mL Mouth Rinse BID  . Chlorhexidine Gluconate Cloth  6 each Topical Daily  . [START ON 08/25/2018] enoxaparin (LOVENOX) injection  70 mg Subcutaneous Q24H  . insulin aspart  1 Units Subcutaneous Q4H  . insulin aspart  2-6 Units Subcutaneous Q4H  . insulin detemir  5 Units Subcutaneous Q12H  . ipratropium-albuterol  3 mL Nebulization Q6H  . mouth rinse  15 mL Mouth Rinse 10 times per day  . pantoprazole (PROTONIX) IV  40 mg Intravenous QHS  . sodium chloride flush  3 mL Intravenous Once    Continuous Infusions: . ampicillin-sulbactam (UNASYN) IV Stopped (08/24/18 0440)  . dextrose    . feeding supplement (VITAL 1.5 CAL) 20 mL/hr at 08/24/18 1000  . fluconazole (DIFLUCAN) IV Stopped (08/24/18 0958)    PRN Meds: albuterol, dextrose, fentaNYL (SUBLIMAZE) injection, fentaNYL (SUBLIMAZE) injection, midazolam, sodium chloride flush  Physical Exam         Has ETT Has OGT Resting in bed Coarse breath sounds Irregular and tachycardic Dependent edema Awake alert, nods and blinks appropriately  Vital Signs: BP 123/65   Pulse 93   Temp 97.6 F (36.4 C)  (Axillary)   Resp (!) 25   Ht _0  (1.702 m)   Wt 72.4 kg   SpO2 100%   BMI 25.00 kg/m  SpO2: SpO2: 100 % O2 Device: O2 Device: Ventilator O2 Flow Rate: O2 Flow Rate (L/min): 15 L/min  Intake/output summary:   Intake/Output Summary (Last 24 hours) at 08/24/2018 1204 Last data filed at 08/24/2018 1000 Gross per 24 hour  Intake 2174.83 ml  Output 2305 ml  Net -130.17 ml   LBM:  Last BM Date: 08/23/18 Baseline Weight: Weight: 73.1 kg Most recent weight: Weight: 72.4 kg       Palliative Assessment/Data:    Flowsheet Rows     Most Recent Value  Intake Tab  Referral Department  Critical care  Unit at Time of Referral  ICU  Palliative Care Primary Diagnosis  Cancer  Palliative Care Type  New Palliative care  Reason for referral  Clarify Goals of Care  Date first seen by Palliative Care  08/20/18  Clinical Assessment  Palliative Performance Scale Score  20%  Pain Max last 24 hours  4  Pain Min Last 24 hours  3  Dyspnea Max Last 24 Hours  4  Dyspnea Min Last 24 hours  3  Psychosocial & Spiritual Assessment  Palliative Care Outcomes      Patient Active Problem List   Diagnosis Date Noted  . Acute hypoxemic respiratory failure ([place]) 08/20/2018  . Aspiration pneumonia of both lower lobes ([place])   . Palliative care by specialist   . Goals of care, counseling/discussion   . Sepsis ([place]) 07/16/2018  . Hyperglycemia 07/16/2018  . Benign essential HTN 07/16/2018  . AMS (altered mental status) 07/16/2018  . COPD with acute exacerbation ([place]) 07/16/2018  . CKD (chronic kidney disease) stage 3, GFR 30-59 ml/min ([facility]) 07/16/2018  . Emphysema of lung ([place]) 07/16/2018  . NSVT (nonsustained ventricular tachycardia) ([place]) 07/16/2018    Palliative Care Assessment & Plan   Patient Profile:    Assessment:  Estanislao Harmon a 84 y.o.malewho resides at [facility] and has hx recurrent aspiration, admitted 6/24 with hypoxic respiratory failure presumed due to aspiration. Required  intubation in ED. 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  The patient remains intubated, PMT consult for ongoing goals of care discussions.   Tachycardic On the vent More awake alert  Has candida in blood, pharmacy following, was already on vanc cefepime, now also started on fluconazole.  PMT following for goals of care discussions, as per out patient palliative care note, patient had endorsed full code, full scope treatment, this is his HCPOA sister's understanding as well, she is Ms Sharlet Salina who lives locally.  Patient also has children in [place], noted daughter's number from out patient palliative note, call placed but unable to reach daughter Charlynn Court at [phone]. Goes straight to voicemail and mailbox is full, unable to reach a message.  I re-attempted to call daughter again this a.m. 6-27 without success.   Recommendations/Plan:  Continue current mode of care  PMT to continue to follow hospital course, how patient does with SBT trials. If appropriate, will arrange for sister Ms Sharlet Salina to come in to the hospital for face to face goals of care discussions.   Goals of Care and Additional Recommendations:  Limitations on Scope of Treatment: Full Scope Treatment  Code Status:    Code Status Orders  (From admission, onward)         Start     Ordered   08/20/18 0153  Full code  Continuous     08/20/18 0154        Code Status History    Date Active Date Inactive Code Status Order ID Comments User Context   07/16/2018 2113 07/21/2018 2110 Full Code [id number]  Elwyn Reach, MD Inpatient   05/20/2018 2308 06/29/2018 1744 Full Code [id number]  Kenton Kingfisher,  K[place] Inpatient   Advance Care Planning Activity       Prognosis:   Unable to determine Guarded  Discharge Planning:  To Be Determined  Care  plan was discussed with patient  Who is still intubated, but he is awake alert and interactive, he nods his head yes/no and attempts to follow the conversation.    Thank you for allowing the Palliative Medicine Team to assist in the care of this patient.   Time In:  10.00 Time Out: 10.25 Total Time 25 Prolonged Time Billed  no       Greater than 50%  of this time was spent counseling and coordinating care related to the above assessment and plan. PPS 30% Loistine Chance, MD [phone] Please contact Palliative Medicine Team phone at [phone] for questions and concerns.

## 2018-08-24 NOTE — Progress Notes (Addendum)
NAME:  Timothy Garcia, MRN:  [id number], DOB:  02-07-1935, LOS: 4 ADMISSION DATE:  08/13/2018, CONSULTATION DATE:  08/20/18 REFERRING MD:  Betsey Holiday  CHIEF COMPLAINT:  SOB   Brief History   Ansen Sayegh is a 84 y.o. male who resides at SNF and has hx recurrent aspiration, admitted 6/24 with hypoxic respiratory failure presumed due to aspiration.  Required intubation in ED.   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  Significant Hospital Events   6/25 > admit.  Intubated, culture sent, IV fluids started, antibiotics initiated  Consults:  None.  Procedures:  ETT 6/24 >  5/25 L picc->6/27  Significant Diagnostic Tests:  CXR 6/24 > bilateral opacities R > L with bibasilar atelectasis.  6/26 echo:  1. The left ventricle has normal systolic function with an ejection fraction of 60-65%. The cavity size was normal. Left ventricular diastolic Doppler parameters are consistent with impaired relaxation. No evidence of left ventricular regional wall  motion abnormalities.  2. The right ventricle has normal systolic function. The cavity was normal. There is no increase in right ventricular wall thickness.  3. Left-sided pleural effusion noted.  4. Mild calcification of the mitral valve leaflet. No evidence of mitral valve stenosis. No significant mitral regurgitation.  5. The aortic valve is tricuspid. Moderate calcification of the aortic valve. No stenosis of the aortic valve.  6. The aortic root is normal in size and structure.  7. Normal IVC size. No complete TR doppler jet so unable to estimate PA systolic pressure.  8. No definite vegetation identified but would need TEE to definitively rule out.  Micro Data:  Blood 6/24 > candida albicans Urine 6/25 > ngtd U. Strep 6/25 > negative U. Legionella 6/25 > negative SARS CoV2 6/24 > negative.  Blood 6/27: ngtd  Antimicrobials:  Vanc 6/24 > 6/26 Cefepime 6/24 >  6/26 unasyn 6/26-> Fluconazole 6/26->  Interim history/subjective:  6/29: no events overnight. Attempted on sbt with increased rr and hr  Objective:  Blood pressure 131/84, pulse 94, temperature 97.8 F (36.6 C), temperature source Axillary, resp. rate (!) 28, height 5\' 7"  (1.702 m), weight 72.4 kg, SpO2 100 %.    Vent Mode: PRVC FiO2 (%):  [40 %] 40 % Set Rate:  [28 bmp] 28 bmp Vt Set:  [520 mL] 520 mL PEEP:  [8 cmH20] 8 cmH20 Plateau Pressure:  [22 cmH20-28 cmH20] 26 cmH20   Intake/Output Summary (Last 24 hours) at 08/24/2018 0804 Last data filed at 08/24/2018 0700 Gross per 24 hour  Intake 2245.37 ml  Output 2080 ml  Net 165.37 ml   Filed Weights   08/22/18 0500 08/23/18 0500 08/24/18 0400  Weight: 72.3 kg 71 kg 72.4 kg    Examination: General: NAD, chronically ill appearing male with ett in place HEENT: NCAT, mmmp, temporal wasting, ETT in place, poor dentition  Pulmonary: Coarse and diffuse rhonchi no accessory use on mechanically assisted breath Cardiac: irregularly irregular, rate variable, no r/m/g Abdomen: mildly distended, non tender, bs active, midabdominal incision well-healed.   No rebound/gaurding Extremities: RUE swollen and warm. Warm and dry does have some dependent edema/anasarca.  Pulses are palpable Neuro: Awake, tries to interact, will follow commands.  Somewhat deconditioned, but MAE against gravity GU: Clear yellow  Assessment & Plan:   Critically ill due to acute hypoxic respiratory failure requiring intubation - presumed due to aspiration especially  in light of underlying hx of recurrent aspiration. Continue full ventilator support Wean as tolerated Not tolerating sbt at this time. Does have large amount of secretions.  Aspiration pneumonia  On  unasyn (id following) Await current pending culture data, no sputum obtained per chart review cxr personally reviewed and remains  with R>L infiltrate Hx COPD.  Severe with FEV1 less than 30%.  No current evidence of exacerbation Plan Continue bronchodilators   Atrial fibrillation with episodes of RVR currently blood pressure within goal Plan Avoid hypokalemia and hypomagnesemia Treat pneumonia Already on Lovenox Chronic HFpEF:  Stable    Anemia acute on chronic: Follow trend, no overt bleeding seen transfuse Hgb <7. Hx DVTs / PE - s/p IVC filter and on lovenox. Plan Continuing preadmission low molecular weight heparin   AKI: Suspect ATN Avoid hypotension Renal dose medications Strict intake/output uop >2L/24hr Follow bmp (baseline Cr 0.9) Non-anion gap metabolic acidosis in the setting of hyperchloremia Plan Improved Hypernatremia:  Free water flushes today 259mlq6 Tolerating trickle tf   C albicans fungemia PICC for chronic TPN most likely source. Removed 6/27 Continue fluconazole. Repeat blood cultures 6/27 ngtd Wbc improving   Hx DM. Episode of hypoglycemia Plan Tolerating tf Sensitive sliding scale insulin Hold any basal insulin for now   Hx gastric adenocarcinoma - on chronic TPN.  Tolerating trickle feeds. Stop TPN due to fungemia.   RUE swelling:  RUE u/s   Failure to thrive. He has been followed by palliative care in the outpatient setting.  Review of their notes from 6/5 demonstrate ongoing weight loss, poor activity tolerance, also suggests fairly poor insight to his prognosis Plan We will ask palliative to continue supportive care while here in-house  Best Practice:  Diet: tf Pain/Anxiety/Delirium protocol (if indicated): per protocol VAP protocol (if indicated): In place. DVT prophylaxis: SCD's / Lovenox. GI prophylaxis: PPI. Glucose control: SSI. Mobility: Bedrest. Code Status: Full. Family Communication: None available. Disposition: ICU. Ongoing palliative care discussions.   Critical care time: The patient is critically ill with multiple organ systems  failure and requires high complexity decision making for assessment and support, frequent evaluation and titration of therapies, application of advanced monitoring technologies and extensive interpretation of multiple databases.  Critical care time 40 mins. This represents my time independent of the NPs time taking care of the pt. This is excluding procedures.    Audria Nine DO Pager: [phone] After hours pager: [phone]  [facility] 08/24/2018, 8:04 AM

## 2018-08-24 NOTE — Progress Notes (Signed)
Right upper extremity venous duplex completed. Refer to "CV Proc" under chart review to view preliminary results.  Preliminary results discussed with Normand Sloop, RN.  08/24/2018 10:05 AM Maudry Mayhew, MHA, RVT, RDCS, RDMS

## 2018-08-24 NOTE — Progress Notes (Signed)
[facility]ious [facility]  Date of Admission:  08/07/2018     Total days of antibiotics 6         ASSESSMENT/PLAN  Timothy Garcia is an 84 year old male TPN dependent following gastrectomy in 2020 and recurrent aspiration pneumonia admitted with worsening mental status and hypoxia and found to have aspiration pneumonia and Candida albicans fungemia.  Respiratory status continues to require ventilatory support.  Currently on day 4 of ampicillin-sulbactam and fluconazole.  PICC line removed on 6/27 and currently undergoing line holiday.  Candida albicans fungemia -most likely source being previous PICC line as he is TPN dependent.  Currently undergoing line holiday.  Repeat cultures remain negative to date.  Likely okay to place PEG in the next 24 to 48 hours.  Will need TEE to rule out endocarditis.  Aspiration pneumonia -continues to require ventilatory support.  White blood cell count trending downward with no recent fever.  Continue current dose of ampicillin- sulbactam and will consider treatment length of approximately 7 days.  Acute hypoxemic respiratory failure -remains on full ventilatory support upon evaluation.  Continue management per critical care team.  Active Problems:   Acute hypoxemic respiratory failure ([place])   Aspiration pneumonia of both lower lobes [facility])   Palliative care by specialist   Goals of care, counseling/discussion   . sodium chloride   Intravenous Once  . bethanechol  10 mg Per Tube TID  . chlorhexidine gluconate (MEDLINE KIT)  15 mL Mouth Rinse BID  . Chlorhexidine Gluconate Cloth  6 each Topical Daily  . [START ON 08/25/2018] enoxaparin (LOVENOX) injection  70 mg Subcutaneous Q24H  . free water  200 mL Per Tube Q6H  . insulin aspart  1 Units Subcutaneous Q4H  . insulin aspart  2-6 Units Subcutaneous Q4H  . insulin detemir  5 Units Subcutaneous Q12H  . ipratropium-albuterol  3 mL Nebulization Q6H  . mouth rinse  15 mL Mouth Rinse 10 times per  day  . pantoprazole (PROTONIX) IV  40 mg Intravenous QHS  . sodium chloride flush  3 mL Intravenous Once    SUBJECTIVE:  Afebrile overnight with no acute events. WBC count improving. Repeat cultures without growth to date. Continues to require ventilatory support.   Allergies  Allergen Reactions  . Ace Inhibitors Other (See Comments)    Unable to recall Unable to recall   . Iothalamate Other (See Comments)    Patient does not know if allergic to this; able to receive current IV contrast (Omnipaque 350) without adverse effects      Review of Systems: Review of Systems  Unable to perform ROS: Intubated    OBJECTIVE: Vitals:   08/24/18 1437 08/24/18 1500 08/24/18 1510 08/24/18 1600  BP:  122/86    Pulse:  88    Resp:  (!) 28    Temp:    (!) 97.2 F (36.2 C)  TempSrc:    Axillary  SpO2: 100% 100% 100%   Weight:      Height:       Body mass index is 25 kg/m.  Physical Exam Constitutional:      General: He is not in acute distress.    Appearance: He is well-developed. He is ill-appearing.     Comments: Lying in bed with head of bed elevated; nods appropriately  Cardiovascular:     Rate and Rhythm: Normal rate and regular rhythm.     Heart sounds: Normal heart sounds.  Pulmonary:     Effort: Pulmonary  effort is normal.     Breath sounds: Normal breath sounds.  Skin:    General: Skin is warm and dry.  Neurological:     Mental Status: He is alert and oriented to person, place, and time.  Psychiatric:        Behavior: Behavior normal.        Thought Content: Thought content normal.        Judgment: Judgment normal.     Lab Results Lab Results  Component Value Date   WBC 16.8 (H) 08/24/2018   HGB 7.7 (L) 08/24/2018   HCT 24.4 (L) 08/24/2018   MCV 81.1 08/24/2018   PLT 342 08/24/2018    Lab Results  Component Value Date   CREATININE 1.30 (H) 08/24/2018   BUN 24 (H) 08/24/2018   NA 157 (H) 08/24/2018   K 3.9 08/24/2018   CL 125 (H) 08/24/2018   CO2 22  08/24/2018    Lab Results  Component Value Date   ALT 43 08/24/2018   AST 31 08/24/2018   ALKPHOS 419 (H) 08/24/2018   BILITOT 0.4 08/24/2018     Microbiology: Recent Results (from the past 240 hour(s))  SARS Coronavirus 2 (CEPHEID- Performed in [facility] hospital lab), Hosp Order     Status: None   Collection Time: 08/12/2018 10:38 PM   Specimen: Nasopharyngeal Swab  Result Value Ref Range Status   SARS Coronavirus 2 NEGATIVE NEGATIVE Final    Comment: (NOTE) If result is NEGATIVE SARS-CoV-2 target nucleic acids are NOT DETECTED. The SARS-CoV-2 RNA is generally detectable in upper and lower  respiratory specimens during the acute phase of infection. The lowest  concentration of SARS-CoV-2 viral copies this assay can detect is 250  copies / mL. A negative result does not preclude SARS-CoV-2 infection  and should not be used as the sole basis for treatment or other  patient management decisions.  A negative result may occur with  improper specimen collection / handling, submission of specimen other  than nasopharyngeal swab, presence of viral mutation(s) within the  areas targeted by this assay, and inadequate number of viral copies  (<250 copies / mL). A negative result must be combined with clinical  observations, patient history, and epidemiological information. If result is POSITIVE SARS-CoV-2 target nucleic acids are DETECTED. The SARS-CoV-2 RNA is generally detectable in upper and lower  respiratory specimens dur ing the acute phase of infection.  Positive  results are indicative of active infection with SARS-CoV-2.  Clinical  correlation with patient history and other diagnostic information is  necessary to determine patient infection status.  Positive results do  not rule out bacterial infection or co-infection with other viruses. If result is PRESUMPTIVE POSTIVE SARS-CoV-2 nucleic acids MAY BE PRESENT.   A presumptive positive result was obtained on the submitted  specimen  and confirmed on repeat testing.  While 2019 novel coronavirus  (SARS-CoV-2) nucleic acids may be present in the submitted sample  additional confirmatory testing may be necessary for epidemiological  and / or clinical management purposes  to differentiate between  SARS-CoV-2 and other Sarbecovirus currently known to infect humans.  If clinically indicated additional testing with an alternate test  methodology [phone]) is advised. The SARS-CoV-2 RNA is generally  detectable in upper and lower respiratory sp ecimens during the acute  phase of infection. The expected result is Negative. Fact Sheet for Patients:  StrictlyIdeas.no Fact Sheet for Healthcare Providers: BankingDealers.co.za This test is not yet approved or cleared by the [place]  FDA and has been authorized for detection and/or diagnosis of SARS-CoV-2 by FDA under an Emergency Use Authorization (EUA).  This EUA will remain in effect (meaning this test can be used) for the duration of the COVID-19 declaration under Section 564(b)(1) of the Act, 21 U.S.C. section 360bbb-3(b)(1), unless the authorization is terminated or revoked sooner. Performed at [facility], [place] [address], [place], [place] [address]   Blood Culture (routine x 2)     Status: Abnormal (Preliminary result)   Collection Time: 08/17/2018 11:15 PM   Specimen: BLOOD RIGHT HAND  Result Value Ref Range Status   Specimen Description BLOOD RIGHT HAND  Final   Special Requests   Final    BOTTLES DRAWN AEROBIC ONLY Blood Culture results may not be optimal due to an inadequate volume of blood received in culture bottles   Culture  Setup Time (A)  Final    YEAST AEROBIC BOTTLE ONLY CRITICAL VALUE NOTED.  VALUE IS CONSISTENT WITH PREVIOUSLY REPORTED AND CALLED VALUE.    Culture (A)  Final    CANDIDA ALBICANS Sent to [facility] for further susceptibility testing. Performed at [facility],  [place] [address], [place], [place] [address]    Report Status PENDING  Incomplete  Blood Culture (routine x 2)     Status: Abnormal (Preliminary result)   Collection Time: 08/04/2018 11:16 PM   Specimen: BLOOD RIGHT HAND  Result Value Ref Range Status   Specimen Description BLOOD RIGHT HAND  Final   Special Requests   Final    BOTTLES DRAWN AEROBIC AND ANAEROBIC Blood Culture results may not be optimal due to an inadequate volume of blood received in culture bottles   Culture  Setup Time   Final    AEROBIC BOTTLE ONLY YEAST CRITICAL RESULT CALLED TO, READ BACK BY AND VERIFIED WITH: PHARMD L EAY [id number] AT 720 AM BY CM GRAM POSITIVE RODS ANAEROBIC BOTTLE ONLY CRITICAL RESULT CALLED TO, READ BACK BY AND VERIFIED WITH: PHARMD KIMBERLY HAMMONS [phone] FCP     Culture (A)  Final    CANDIDA ALBICANS Sent to [place] for further susceptibility testing. LACTOBACILLUS SPECIES Standardized susceptibility testing for this organism is not available. Performed at [facility], [place] [address], [place], [place] [address]    Report Status PENDING  Incomplete  Blood Culture ID Panel (Reflexed)     Status: Abnormal   Collection Time: 08/02/2018 11:16 PM  Result Value Ref Range Status   Enterococcus species NOT DETECTED NOT DETECTED Final   Listeria monocytogenes NOT DETECTED NOT DETECTED Final   Staphylococcus species NOT DETECTED NOT DETECTED Final   Staphylococcus aureus (BCID) NOT DETECTED NOT DETECTED Final   Streptococcus species NOT DETECTED NOT DETECTED Final   Streptococcus agalactiae NOT DETECTED NOT DETECTED Final   Streptococcus pneumoniae NOT DETECTED NOT DETECTED Final   Streptococcus pyogenes NOT DETECTED NOT DETECTED Final   Acinetobacter baumannii NOT DETECTED NOT DETECTED Final   Enterobacteriaceae species NOT DETECTED NOT DETECTED Final   Enterobacter cloacae complex NOT DETECTED NOT DETECTED Final   Escherichia coli NOT DETECTED NOT DETECTED Final   Klebsiella oxytoca NOT  DETECTED NOT DETECTED Final   Klebsiella pneumoniae NOT DETECTED NOT DETECTED Final   Proteus species NOT DETECTED NOT DETECTED Final   Serratia marcescens NOT DETECTED NOT DETECTED Final   Haemophilus influenzae NOT DETECTED NOT DETECTED Final   Neisseria meningitidis NOT DETECTED NOT DETECTED Final   Pseudomonas aeruginosa NOT DETECTED NOT DETECTED Final  Candida albicans DETECTED (A) NOT DETECTED Final    Comment: CRITICAL RESULT CALLED TO, READ BACK BY AND VERIFIED WITH: PHARMD L SEAY [id number] AT 720 AM BY CM    Candida glabrata NOT DETECTED NOT DETECTED Final   Candida krusei NOT DETECTED NOT DETECTED Final   Candida parapsilosis NOT DETECTED NOT DETECTED Final   Candida tropicalis NOT DETECTED NOT DETECTED Final    Comment: Performed at [facility] [address] [place], [place] [address]  MRSA PCR Screening     Status: None   Collection Time: 08/20/18  4:04 AM   Specimen: Nasopharyngeal  Result Value Ref Range Status   MRSA by PCR NEGATIVE NEGATIVE Final    Comment:        The GeneXpert MRSA Assay (FDA approved for NASAL specimens only), is one component of a comprehensive MRSA colonization surveillance program. It is not intended to diagnose MRSA infection nor to guide or monitor treatment for MRSA infections. Performed at [facility], [place] [address], [place], [place] [address]   Urine culture     Status: None   Collection Time: 08/20/18  5:34 AM   Specimen: Urine, Random  Result Value Ref Range Status   Specimen Description URINE, RANDOM  Final   Special Requests NONE  Final   Culture   Final    NO GROWTH Performed at [facility], [place] [address] [place], [place] [address]    Report Status 08/21/2018 FINAL  Final  Culture, blood (Routine X 2) w Reflex to ID Panel     Status: None (Preliminary result)   Collection Time: 08/22/18  2:20 PM   Specimen: BLOOD RIGHT HAND  Result Value Ref Range Status   Specimen Description BLOOD RIGHT HAND   Final   Special Requests   Final    BOTTLES DRAWN AEROBIC ONLY Blood Culture results may not be optimal due to an inadequate volume of blood received in culture bottles   Culture   Final    NO GROWTH 2 DAYS Performed at [facility], [place] [address] [place], [place] [address]    Report Status PENDING  Incomplete  Culture, respiratory     Status: None (Preliminary result)   Collection Time: 08/24/18  9:27 AM   Specimen: Tracheal Aspirate; Respiratory  Result Value Ref Range Status   Specimen Description TRACHEAL ASPIRATE  Final   Special Requests NONE  Final   Gram Stain   Final    MODERATE WBC PRESENT, PREDOMINANTLY PMN MODERATE GRAM NEGATIVE RODS RARE YEAST Performed at [facility], [address], [place], [place] [address]    Culture PENDING  Incomplete   Report Status PENDING  Incomplete     Terri Piedra, NP [facility] Pager  08/24/2018  4:52 PM

## 2018-08-24 NOTE — Progress Notes (Signed)
eLink Physician-Brief Progress Note Patient Name: Timothy Garcia DOB: 1934/03/17 MRN: [id number]   Date of Service  08/24/2018  HPI/Events of Note  Retention of urine x 3 on scan in last 24 hrs, asking for foley. Stomach cancer, TPN, HCAP.   eICU Interventions  Foley ordered     Intervention Category Intermediate Interventions: Other:  Elmer Sow 08/24/2018, 1:18 AM

## 2018-08-25 ENCOUNTER — Inpatient Hospital Stay (HOSPITAL_COMMUNITY): Payer: Medicare Other

## 2018-08-25 DIAGNOSIS — A419 Sepsis, unspecified organism: Secondary | ICD-10-CM

## 2018-08-25 DIAGNOSIS — R6521 Severe sepsis with septic shock: Secondary | ICD-10-CM

## 2018-08-25 LAB — GLUCOSE, CAPILLARY
Glucose-Capillary: 91 mg/dL (ref 70–99)
Glucose-Capillary: 68 mg/dL — ABNORMAL LOW (ref 70–99)
Glucose-Capillary: 170 mg/dL — ABNORMAL HIGH (ref 70–99)
Glucose-Capillary: 116 mg/dL — ABNORMAL HIGH (ref 70–99)
Glucose-Capillary: 139 mg/dL — ABNORMAL HIGH (ref 70–99)
Glucose-Capillary: 138 mg/dL — ABNORMAL HIGH (ref 70–99)
Glucose-Capillary: 159 mg/dL — ABNORMAL HIGH (ref 70–99)

## 2018-08-25 LAB — CBC
HCT: 23.8 % — ABNORMAL LOW (ref 39.0–52.0)
Hemoglobin: 7.8 g/dL — ABNORMAL LOW (ref 13.0–17.0)
MCH: 25.9 pg — ABNORMAL LOW (ref 26.0–34.0)
MCHC: 32.8 g/dL (ref 30.0–36.0)
MCV: 79.1 fL — ABNORMAL LOW (ref 80.0–100.0)
Platelets: 267 10*3/uL (ref 150–400)
RBC: 3.01 MIL/uL — ABNORMAL LOW (ref 4.22–5.81)
RDW: 18.6 % — ABNORMAL HIGH (ref 11.5–15.5)
WBC: 11.9 10*3/uL — ABNORMAL HIGH (ref 4.0–10.5)
nRBC: 0.3 % — ABNORMAL HIGH (ref 0.0–0.2)

## 2018-08-25 LAB — BASIC METABOLIC PANEL
Anion gap: 7 (ref 5–15)
BUN: 16 mg/dL (ref 8–23)
CO2: 21 mmol/L — ABNORMAL LOW (ref 22–32)
Calcium: 7.8 mg/dL — ABNORMAL LOW (ref 8.9–10.3)
Chloride: 127 mmol/L — ABNORMAL HIGH (ref 98–111)
Creatinine, Ser: 1.22 mg/dL (ref 0.61–1.24)
GFR calc Af Amer: >60 mL/min (ref >60)
GFR calc non Af Amer: 55 mL/min — ABNORMAL LOW (ref >60)
Glucose, Bld: 68 mg/dL — ABNORMAL LOW (ref 70–99)
Potassium: 3.5 mmol/L (ref 3.5–5.1)
Sodium: 155 mmol/L — ABNORMAL HIGH (ref 135–145)

## 2018-08-25 MED ORDER — SODIUM CHLORIDE 0.9 % IV SOLN
1.0000 g | INTRAVENOUS | Status: DC
  Administered 2018-08-25: 1 g via INTRAVENOUS
  Filled 2018-08-25: qty 1
  Filled 2018-08-25: qty 10

## 2018-08-25 MED ORDER — METRONIDAZOLE IN NACL 5-0.79 MG/ML-% IV SOLN
500.0000 mg | Freq: Three times a day (TID) | INTRAVENOUS | Status: DC
  Administered 2018-08-25 – 2018-08-26 (×3): 500 mg via INTRAVENOUS
  Filled 2018-08-25 (×3): qty 100

## 2018-08-25 MED ORDER — PRO-STAT SUGAR FREE PO LIQD
60.0000 mL | Freq: Two times a day (BID) | ORAL | Status: DC
  Administered 2018-08-25 – 2018-09-02 (×16): 60 mL
  Filled 2018-08-25 (×16): qty 60

## 2018-08-25 MED ORDER — DEXTROSE 50 % IV SOLN
INTRAVENOUS | Status: AC
  Administered 2018-08-25: 08:00:00 50 mL
  Filled 2018-08-25: qty 50

## 2018-08-25 MED ORDER — FREE WATER
300.0000 mL | Status: DC
  Administered 2018-08-25 – 2018-09-02 (×45): 300 mL

## 2018-08-25 MED ORDER — ENOXAPARIN SODIUM 80 MG/0.8ML ~~LOC~~ SOLN
70.0000 mg | Freq: Two times a day (BID) | SUBCUTANEOUS | Status: DC
  Administered 2018-08-25 – 2018-09-01 (×14): 70 mg via SUBCUTANEOUS
  Filled 2018-08-25 (×14): qty 0.8

## 2018-08-25 MED ORDER — VITAL 1.5 CAL PO LIQD
1000.0000 mL | ORAL | Status: DC
  Administered 2018-08-25 – 2018-08-28 (×3): 1000 mL
  Filled 2018-08-25 (×3): qty 1000

## 2018-08-25 NOTE — Progress Notes (Signed)
Daily Progress Note   Patient Name: Timothy Garcia       Date: 08/25/2018 DOB: [birth year]  Age: 84 y.o. MRN#: [id number] Attending Physician: Roxanne Mins, MD Primary Care Physician: Rogers Blocker, MD Admit Date: 08/11/2018  Reason for Consultation/Follow-up: Establishing goals of care  Subjective: Lying in bed with eyes open.   ETT in place.  Some nods and blinking on command, but not clear that he is able to have meaningful conversation.  I called and spoke with his sister today via phone.  She reports understanding that he is critically ill, but in my conversation, it appears as though she did not have full understanding of his situation.  When I mentioned working to wean him from the ventilator, she noted that someone told her that he needed "a tube to help him breathe" but she was unaware that this meant that he required mechanical ventilation and was getting ventilator support.  We reviewed again his clinical situation, and she reports understanding need to discuss next steps, but she is hopeful that he can participate in conversation.  She reports that he had told her that he wanted all aggressive measures, and she was able to reach patient's daughter who she reports said to "do everything," but she feels that he may not want some interventions, such as tracheostomy, if they would be permanent and not lead to him being well enough to live independently.  Length of Stay: 5  Current Medications: Scheduled Meds:   sodium chloride   Intravenous Once   bethanechol  10 mg Per Tube TID   chlorhexidine gluconate (MEDLINE KIT)  15 mL Mouth Rinse BID   Chlorhexidine Gluconate Cloth  6 each Topical Daily   enoxaparin (LOVENOX) injection  70 mg Subcutaneous Q12H   feeding supplement (PRO-STAT  SUGAR FREE 64)  60 mL Per Tube BID   free water  300 mL Per Tube Q4H   insulin aspart  1 Units Subcutaneous Q4H   insulin aspart  2-6 Units Subcutaneous Q4H   insulin detemir  5 Units Subcutaneous Q12H   ipratropium-albuterol  3 mL Nebulization Q6H   mouth rinse  15 mL Mouth Rinse 10 times per day   pantoprazole (PROTONIX) IV  40 mg Intravenous QHS   sodium chloride flush  3 mL Intravenous Once  Continuous Infusions:  cefTRIAXone (ROCEPHIN)  IV     dextrose     feeding supplement (VITAL 1.5 CAL) 1,000 mL (08/25/18 1007)   fluconazole (DIFLUCAN) IV Stopped (08/25/18 1001)   metronidazole      PRN Meds: albuterol, dextrose, fentaNYL (SUBLIMAZE) injection, fentaNYL (SUBLIMAZE) injection, midazolam, sodium chloride flush  Physical Exam         Has ETT Has OGT Resting in bed Coarse breath sounds Irregular and tachycardic Dependent edema Awake alert, nods and blinks appropriately  Vital Signs: BP 112/69    Pulse (!) 106    Temp 99.3 F (37.4 C) (Axillary)    Resp (!) 25    Ht _0  (1.702 m)    Wt 74.3 kg    SpO2 98%    BMI 25.66 kg/m  SpO2: SpO2: 98 % O2 Device: O2 Device: Ventilator O2 Flow Rate: O2 Flow Rate (L/min): 15 L/min  Intake/output summary:   Intake/Output Summary (Last 24 hours) at 08/25/2018 1625 Last data filed at 08/25/2018 1600 Gross per 24 hour  Intake 2250.92 ml  Output 1300 ml  Net 950.92 ml   LBM: Last BM Date: 08/25/18 Baseline Weight: Weight: 73.1 kg Most recent weight: Weight: 74.3 kg       Palliative Assessment/Data:    Flowsheet Rows     Most Recent Value  Intake Tab  Referral Department  Critical care  Unit at Time of Referral  ICU  Palliative Care Primary Diagnosis  Cancer  Palliative Care Type  New Palliative care  Reason for referral  Clarify Goals of Care  Date first seen by Palliative Care  08/20/18  Clinical Assessment  Palliative Performance Scale Score  20%  Pain Max last 24 hours  4  Pain Min Last 24 hours   3  Dyspnea Max Last 24 Hours  4  Dyspnea Min Last 24 hours  3  Psychosocial & Spiritual Assessment  Palliative Care Outcomes      Patient Active Problem List   Diagnosis Date Noted   Acute hypoxemic respiratory failure ([facility]) 08/20/2018   Aspiration pneumonia of both lower lobes ([facility])    Palliative care by specialist    Goals of care, counseling/discussion    Sepsis ([facility]) 07/16/2018   Hyperglycemia 07/16/2018   Benign essential HTN 07/16/2018   AMS (altered mental status) 07/16/2018   COPD with acute exacerbation ([place]) 07/16/2018   CKD (chronic kidney disease) stage 3, GFR 30-59 ml/min (HCC) 07/16/2018   Emphysema of lung ([place]) 07/16/2018   NSVT (nonsustained ventricular tachycardia) ([facility]) 07/16/2018    Palliative Care Assessment & Plan   Patient Profile:    Assessment:  Timothy Smithis a 84 y.[facility].malewho [facility] at [facility] and has hx recurrent aspiration, admitted 6/24 with hypoxic respiratory failure presumed due to aspiration. Required intubation in ED. 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  The patient remains intubated, PMT consult for ongoing goals of care discussions.   Tachycardic On the vent More awake alert  Has candida in blood, pharmacy following, was already on vanc cefepime, now also started on fluconazole.  PMT following for goals of care discussions, as per out patient palliative care note, patient had endorsed full code, full scope treatment, this is his HCPOA sister's understanding as well, she is Ms Timothy Garcia who lives locally.  Patient also has children in [place], noted daughter's number from out  patient palliative note, call placed but unable to reach daughter Charlynn Court at [phone]. Goes straight to voicemail and mailbox is full, unable to reach a message.    Recommendations/Plan:  Continue current mode of care  I was able to discuss with patients sister again today.  I do not think that she has fully realized the severity of his situation and we discussed this today.  She reports needing time to talk further with family, and she wants him to make his own decisions as much as possible.  I let her know that I would follow-up again with patient tomorrow, but based on interaction today, I do not think that he has insight into his condition to make decisions without family support.  Will reach out again to sister tomorrow to continue conversation.    Goals of Care and Additional Recommendations:  Limitations on Scope of Treatment: Full Scope Treatment  Code Status:    Code Status Orders  (From admission, onward)         Start     Ordered   08/20/18 0153  Full code  Continuous     08/20/18 0154        Code Status History    Date Active Date Inactive Code Status Order ID Comments User Context   07/16/2018 2113 07/21/2018 2110 Full Code [id number]  Elwyn Reach, MD Inpatient   05/20/2018 2308 06/29/2018 1744 Full Code [id number]  Horald Chestnut Inpatient   Advance Care Planning Activity       Prognosis:   Unable to determine Guarded  Discharge Planning:  To Be Determined  Care plan was discussed with patient (intubated) and sister  Thank you for allowing the Palliative Medicine Team to assist in the care of this patient.   Time In:  1440 Time Out: 1520 Total Time 40 Prolonged Time Billed  no       Greater than 50%  of this time was spent counseling and coordinating care related to the above assessment and plan.  Micheline Rough, MD [facility] Team [phone]  Please contact Palliative Medicine Team phone at [phone] for questions and concerns.

## 2018-08-25 NOTE — Progress Notes (Signed)
Timothy Garcia for Infectious Disease  Date of Admission:  07/31/2018     Total days of antibiotics 7         ASSESSMENT/PLAN  [name] is an 84 year old male TPN dependent following gastrectomy in 2020 and recurrent aspiration pneumonia admitted with worsening mental status and hypoxia and found to have aspiration pneumonia and Candida albicans fungemia.  He continues to require ventilatory support with minimal weaning accomplished today.  Currently on day 5 of antimicrobial therapy with Unasyn and fluconazole.  PICC line removed on 6/27.  Repeat cultures without growth to date.  Candida albicans fungemia -suspect PICC line as source of infection status post removal with cultures remaining without growth to date.  Okay for PICC line replacement and to resume TPN as indicated/needed.  Awaiting TEE results to rule out endocarditis.  Continue current dose of fluconazole.  Aspiration pneumonia -continues to remain on the ventilator although afebrile with no leukocytosis.  Change Unasyn to ceftriaxone and metronidazole due to hypernatremia.  Continue antibiotics for 2 more days totaling 7 days of therapy.  Hypernatremia -likely multifactorial including Unasyn which has a high sodium content.  Discontinue Unasyn.  Additional interventions per primary team.  Acute hypoxemic respiratory failure -continues to require ventilatory support with minimal weaning accomplished today.  Continue management per critical care.   Active Problems:   Acute hypoxemic respiratory failure (HCC)   Aspiration pneumonia of both lower lobes [facility])   Palliative care by specialist   Goals of care, counseling/discussion   . sodium chloride   Intravenous Once  . bethanechol  10 mg Per Tube TID  . chlorhexidine gluconate (MEDLINE KIT)  15 mL Mouth Rinse BID  . Chlorhexidine Gluconate Cloth  6 each Topical Daily  . enoxaparin (LOVENOX) injection  70 mg Subcutaneous Q12H  . feeding supplement (PRO-STAT SUGAR FREE  64)  60 mL Per Tube BID  . free water  300 mL Per Tube Q4H  . insulin aspart  1 Units Subcutaneous Q4H  . insulin aspart  2-6 Units Subcutaneous Q4H  . insulin detemir  5 Units Subcutaneous Q12H  . ipratropium-albuterol  3 mL Nebulization Q6H  . mouth rinse  15 mL Mouth Rinse 10 times per day  . pantoprazole (PROTONIX) IV  40 mg Intravenous QHS  . sodium chloride flush  3 mL Intravenous Once    SUBJECTIVE:  Afebrile overnight with improving leukocytosis. No acute events. Remains on line holiday for fungemia.   Allergies  Allergen Reactions  . Ace Inhibitors Other (See Comments)    Unable to recall Unable to recall   . Iothalamate Other (See Comments)    Patient does not know if allergic to this; able to receive current IV contrast (Omnipaque 350) without adverse effects      Review of Systems: Review of Systems  Unable to perform ROS: Intubated      OBJECTIVE: Vitals:   08/25/18 1400 08/25/18 1448 08/25/18 1502 08/25/18 1600  BP: 119/67   112/69  Pulse: 83   (!) 106  Resp: (!) 28   (!) 25  Temp:      TempSrc:      SpO2: 100% 100% 99% 98%  Weight:      Height:       Body mass index is 25.66 kg/m.  Physical Exam Constitutional:      General: He is not in acute distress.    Appearance: He is well-developed.     Comments: Lying in bed with head of bed  elevated; nods appropriately.   Cardiovascular:     Rate and Rhythm: Normal rate and regular rhythm.     Heart sounds: Normal heart sounds.  Pulmonary:     Effort: Pulmonary effort is normal.     Breath sounds: Normal breath sounds.  Skin:    General: Skin is warm and dry.  Neurological:     Mental Status: He is alert.  Psychiatric:        Mood and Affect: Mood normal.     Lab Results Lab Results  Component Value Date   WBC 11.9 (H) 08/25/2018   HGB 7.8 (L) 08/25/2018   HCT 23.8 (L) 08/25/2018   MCV 79.1 (L) 08/25/2018   PLT 267 08/25/2018    Lab Results  Component Value Date   CREATININE 1.22  08/25/2018   BUN 16 08/25/2018   NA 155 (H) 08/25/2018   K 3.5 08/25/2018   CL 127 (H) 08/25/2018   CO2 21 (L) 08/25/2018    Lab Results  Component Value Date   ALT 43 08/24/2018   AST 31 08/24/2018   ALKPHOS 419 (H) 08/24/2018   BILITOT 0.4 08/24/2018     Microbiology: Recent Results (from the past 240 hour(s))  SARS Coronavirus 2 (CEPHEID- Performed in [place] hospital lab), Hosp Order     Status: None   Collection Time: 08/21/2018 10:38 PM   Specimen: Nasopharyngeal Swab  Result Value Ref Range Status   SARS Coronavirus 2 NEGATIVE NEGATIVE Final    Comment: (NOTE) If result is NEGATIVE SARS-CoV-2 target nucleic acids are NOT DETECTED. The SARS-CoV-2 RNA is generally detectable in upper and lower  respiratory specimens during the acute phase of infection. The lowest  concentration of SARS-CoV-2 viral copies this assay can detect is 250  copies / mL. A negative result does not preclude SARS-CoV-2 infection  and should not be used as the sole basis for treatment or other  patient management decisions.  A negative result may occur with  improper specimen collection / handling, submission of specimen other  than nasopharyngeal swab, presence of viral mutation(s) within the  areas targeted by this assay, and inadequate number of viral copies  (<250 copies / mL). A negative result must be combined with clinical  observations, patient history, and epidemiological information. If result is POSITIVE SARS-CoV-2 target nucleic acids are DETECTED. The SARS-CoV-2 RNA is generally detectable in upper and lower  respiratory specimens dur ing the acute phase of infection.  Positive  results are indicative of active infection with SARS-CoV-2.  Clinical  correlation with patient history and other diagnostic information is  necessary to determine patient infection status.  Positive results do  not rule out bacterial infection or co-infection with other viruses. If result is PRESUMPTIVE  POSTIVE SARS-CoV-2 nucleic acids MAY BE PRESENT.   A presumptive positive result was obtained on the submitted specimen  and confirmed on repeat testing.  While 2019 novel coronavirus  (SARS-CoV-2) nucleic acids may be present in the submitted sample  additional confirmatory testing may be necessary for epidemiological  and / or clinical management purposes  to differentiate between  SARS-CoV-2 and other Sarbecovirus currently known to infect humans.  If clinically indicated additional testing with an alternate test  methodology [phone]) is advised. The SARS-CoV-2 RNA is generally  detectable in upper and lower respiratory sp ecimens during the acute  phase of infection. The expected result is Negative. Fact Sheet for Patients:  StrictlyIdeas.no Fact Sheet for Healthcare Providers: BankingDealers.co.za This test is not  yet approved or cleared by the [place] and has been authorized for detection and/or diagnosis of SARS-CoV-2 by FDA under an Emergency Use Authorization (EUA).  This EUA will remain in effect (meaning this test can be used) for the duration of the COVID-19 declaration under Section 564(b)(1) of the Act, 21 U.S.C. section 360bbb-3(b)(1), unless the authorization is terminated or revoked sooner. Performed at [facility], [place] [address], [place], [place] [address]   Blood Culture (routine x 2)     Status: Abnormal (Preliminary result)   Collection Time: 08/08/2018 11:15 PM   Specimen: BLOOD RIGHT HAND  Result Value Ref Range Status   Specimen Description BLOOD RIGHT HAND  Final   Special Requests   Final    BOTTLES DRAWN AEROBIC ONLY Blood Culture results may not be optimal due to an inadequate volume of blood received in culture bottles   Culture  Setup Time (A)  Final    YEAST AEROBIC BOTTLE ONLY CRITICAL VALUE NOTED.  VALUE IS CONSISTENT WITH PREVIOUSLY REPORTED AND CALLED VALUE.    Culture (A)  Final     CANDIDA ALBICANS Sent to [place] for further susceptibility testing. Performed at [facility], [place] [address] [place], [place] [address]    Report Status PENDING  Incomplete  Blood Culture (routine x 2)     Status: Abnormal (Preliminary result)   Collection Time: 08/13/2018 11:16 PM   Specimen: BLOOD RIGHT HAND  Result Value Ref Range Status   Specimen Description BLOOD RIGHT HAND  Final   Special Requests   Final    BOTTLES DRAWN AEROBIC AND ANAEROBIC Blood Culture results may not be optimal due to an inadequate volume of blood received in culture bottles   Culture  Setup Time   Final    AEROBIC BOTTLE ONLY YEAST CRITICAL RESULT CALLED TO, READ BACK BY AND VERIFIED WITH: PHARMD L EAY [id number] AT 720 AM BY CM GRAM POSITIVE RODS ANAEROBIC BOTTLE ONLY CRITICAL RESULT CALLED TO, READ BACK BY AND VERIFIED WITH: PHARMD KIMBERLY HAMMONS [id number] FCP     Culture (A)  Final    CANDIDA ALBICANS Sent to [facility] for further susceptibility testing. LACTOBACILLUS SPECIES Standardized susceptibility testing for this organism is not available. Performed at [facility], [place] [address], [place], [place] [address]    Report Status PENDING  Incomplete  Blood Culture ID Panel (Reflexed)     Status: Abnormal   Collection Time: 08/03/2018 11:16 PM  Result Value Ref Range Status   Enterococcus species NOT DETECTED NOT DETECTED Final   Listeria monocytogenes NOT DETECTED NOT DETECTED Final   Staphylococcus species NOT DETECTED NOT DETECTED Final   Staphylococcus aureus (BCID) NOT DETECTED NOT DETECTED Final   Streptococcus species NOT DETECTED NOT DETECTED Final   Streptococcus agalactiae NOT DETECTED NOT DETECTED Final   Streptococcus pneumoniae NOT DETECTED NOT DETECTED Final   Streptococcus pyogenes NOT DETECTED NOT DETECTED Final   Acinetobacter baumannii NOT DETECTED NOT DETECTED Final   Enterobacteriaceae species NOT DETECTED NOT DETECTED Final   Enterobacter cloacae complex  NOT DETECTED NOT DETECTED Final   Escherichia coli NOT DETECTED NOT DETECTED Final   Klebsiella oxytoca NOT DETECTED NOT DETECTED Final   Klebsiella pneumoniae NOT DETECTED NOT DETECTED Final   Proteus species NOT DETECTED NOT DETECTED Final   Serratia marcescens NOT DETECTED NOT DETECTED Final   Haemophilus influenzae NOT DETECTED NOT DETECTED Final   Neisseria meningitidis NOT DETECTED NOT DETECTED Final   Pseudomonas  aeruginosa NOT DETECTED NOT DETECTED Final   Candida albicans DETECTED (A) NOT DETECTED Final    Comment: CRITICAL RESULT CALLED TO, READ BACK BY AND VERIFIED WITH: PHARMD L SEAY [id number] AT 720 AM BY CM    Candida glabrata NOT DETECTED NOT DETECTED Final   Candida krusei NOT DETECTED NOT DETECTED Final   Candida parapsilosis NOT DETECTED NOT DETECTED Final   Candida tropicalis NOT DETECTED NOT DETECTED Final    Comment: Performed at [facility] [address], [place], [place] [address]  MRSA PCR Screening     Status: None   Collection Time: 08/20/18  4:04 AM   Specimen: Nasopharyngeal  Result Value Ref Range Status   MRSA by PCR NEGATIVE NEGATIVE Final    Comment:        The GeneXpert MRSA Assay (FDA approved for NASAL specimens only), is one component of a comprehensive MRSA colonization surveillance program. It is not intended to diagnose MRSA infection nor to guide or monitor treatment for MRSA infections. Performed at [facility], [place] [address] [place], [place] [address]   Urine culture     Status: None   Collection Time: 08/20/18  5:34 AM   Specimen: Urine, Random  Result Value Ref Range Status   Specimen Description URINE, RANDOM  Final   Special Requests NONE  Final   Culture   Final    NO GROWTH Performed at [facility], [place] [address] [place], [place] [address]    Report Status 08/21/2018 FINAL  Final  Culture, blood (Routine X 2) w Reflex to ID Panel     Status: None (Preliminary result)   Collection Time: 08/22/18   2:20 PM   Specimen: BLOOD RIGHT HAND  Result Value Ref Range Status   Specimen Description BLOOD RIGHT HAND  Final   Special Requests   Final    BOTTLES DRAWN AEROBIC ONLY Blood Culture results may not be optimal due to an inadequate volume of blood received in culture bottles   Culture   Final    NO GROWTH 3 DAYS Performed at [facility], [place] [address] [place], [place] [address]    Report Status PENDING  Incomplete  Culture, respiratory     Status: None (Preliminary result)   Collection Time: 08/24/18  9:27 AM   Specimen: Tracheal Aspirate; Respiratory  Result Value Ref Range Status   Specimen Description TRACHEAL ASPIRATE  Final   Special Requests NONE  Final   Gram Stain   Final    MODERATE WBC PRESENT, PREDOMINANTLY PMN MODERATE GRAM NEGATIVE RODS RARE YEAST Performed at [facility], [address] [place],  [address]    Culture MODERATE KLEBSIELLA PNEUMONIAE  Final   Report Status PENDING  Incomplete     Terri Piedra, NP [facility] [phone] Pager  08/25/2018  4:38 PM

## 2018-08-25 NOTE — Progress Notes (Signed)
NAME:  Timothy Garcia, MRN:  [id number], DOB:  12-17-1934, LOS: 5 ADMISSION DATE:  08/17/2018, CONSULTATION DATE:  08/20/18 REFERRING MD:  Betsey Holiday  CHIEF COMPLAINT:  SOB   Brief History   Timothy Garcia is a 84 y.o. male who resides at SNF and has hx recurrent aspiration, admitted 6/24 with hypoxic respiratory failure presumed due to aspiration.  Required intubation in ED.   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  Significant Hospital Events   6/25 > admit.  Intubated, culture sent, IV fluids started, antibiotics initiated  Consults:  None.  Procedures:  ETT 6/24 >  5/25 L picc->6/27  Significant Diagnostic Tests:  CXR 6/24 > bilateral opacities R > L with bibasilar atelectasis.  6/26 echo:  1. The left ventricle has normal systolic function with an ejection fraction of 60-65%. The cavity size was normal. Left ventricular diastolic Doppler parameters are consistent with impaired relaxation. No evidence of left ventricular regional wall  motion abnormalities.  2. The right ventricle has normal systolic function. The cavity was normal. There is no increase in right ventricular wall thickness.  3. Left-sided pleural effusion noted.  4. Mild calcification of the mitral valve leaflet. No evidence of mitral valve stenosis. No significant mitral regurgitation.  5. The aortic valve is tricuspid. Moderate calcification of the aortic valve. No stenosis of the aortic valve.  6. The aortic root is normal in size and structure.  7. Normal IVC size. No complete TR doppler jet so unable to estimate PA systolic pressure.  8. No definite vegetation identified but would need TEE to definitively rule out.  Micro Data:  Blood 6/24 > candida albicans Urine 6/25 > neg U. Strep 6/25 > negative U. Legionella 6/25 > negative SARS CoV2 6/24 > negative.  Blood 6/27: ngtd  Antimicrobials:  Vanc 6/24 > 6/26 Cefepime 6/24 >  6/26 unasyn 6/26-> Fluconazole 6/26->  Interim history/subjective:  No acute events overnight  Objective:  Blood pressure (!) 121/56, pulse 97, temperature 98.6 F (37 C), temperature source Axillary, resp. rate (!) 28, height 5\' 7"  (1.702 m), weight 74.3 kg, SpO2 98 %.    Vent Mode: PRVC FiO2 (%):  [40 %] 40 % Set Rate:  [28 bmp] 28 bmp Vt Set:  [520 mL] 520 mL PEEP:  [8 cmH20] 8 cmH20 Plateau Pressure:  [22 cmH20-26 cmH20] 22 cmH20   Intake/Output Summary (Last 24 hours) at 08/25/2018 0858 Last data filed at 08/25/2018 0802 Gross per 24 hour  Intake 1843.48 ml  Output 1475 ml  Net 368.48 ml   Filed Weights   08/23/18 0500 08/24/18 0400 08/25/18 0500  Weight: 71 kg 72.4 kg 74.3 kg    Examination: General: Frail elderly male who appears his stated age 63: No JVD or lymphadenopathy is appreciated Neuro: Noted to be tremulous.  Nods appropriately.  Weakly moves extremities CV: Heart sounds are irregular PULM: even/non-labored, lungs bilaterally diminished throughout GI: Soft, midline scar well approximated healed, feedings throughout 20 trickle Extremities: warm/dry, 1+ edema  Skin: no rashes or lesions   Assessment & Plan:   Critically ill due to acute hypoxic respiratory failure requiring intubation - presumed due to aspiration especially in light of underlying hx of recurrent aspiration. Requiring full ventilatory support Suspect failure to thrive advanced age and multiple medical comorbidities.  Any successful weaning and extubation   Aspiration pneumonia  Candidemia Antibiotics  and antifungal per ID  cxr personally reviewed and remains with R>L infiltrate Hx COPD.  Severe with FEV1 less than 30%.  No current evidence of exacerbation Plan Continue bronchodilators   Atrial fibrillation with episodes of RVR currently blood pressure within goal Plan Controlled ventricular rate   Lovenox   Chronic HFpEF:  Stable    Anemia acute on chronic: Recent Labs    08/24/18 0440 08/25/18 0722  HGB 7.7* 7.8*  ]  Transfuse per protocol.  Hx DVTs / PE - s/p IVC filter and on lovenox. Plan Continue Lovenox   AKI: Suspect ATN Lab Results  Component Value Date   CREATININE 1.22 08/25/2018   CREATININE 1.30 (H) 08/24/2018   CREATININE 1.26 (H) 08/23/2018    Avoid nephrotoxins Noted to be resolving Strict intake and output   Non-anion gap metabolic acidosis in the setting of hyperchloremia Plan Resolved  Hypernatremia:  Recent Labs  Lab 08/23/18 0920 08/24/18 0440 08/25/18 0722  NA 155* 157* 155*    Free water flushes  Tube feedings Continue to monitor sodium May need D5W infusion in future   C albicans fungemia PICC line for chronic TPN and was removed 627 Continue Diflucan ID is following Monitor culture data   Hx DM. CBG (last 3)  Recent Labs    08/25/18 0354 08/25/18 0733 08/25/18 0830  GLUCAP 91 68* 170*    Episode of hypoglycemia Plan Tolerating tube feeding Continue sliding scale insulin No basal insulin at this time   Hx gastric adenocarcinoma - on chronic TPN.  Continue trickle feeds Currently off TPN due to fungemia Advance tube feeding slowly   RUE swelling:  RUE u/s revealed findings consistent with acute superficial vein thrombosis involving the right cephalic vein   Failure to thrive. He has been followed by palliative care in the outpatient setting.  Review of their notes from 6/5 demonstrate ongoing weight loss, poor activity tolerance, also suggests fairly poor insight to his prognosis Plan Evaluated by palliative care 08/23/2018   Best Practice:  Diet: tf currently at trickle advancing as tolerated Pain/Anxiety/Delirium protocol (if indicated): per protocol VAP protocol (if indicated): In place. DVT prophylaxis: SCD's / Lovenox. GI prophylaxis: PPI. Glucose control: SSI. Mobility: Bedrest. Code  Status: Full.  Palliative care is involved Family Communication: No family available at this time Disposition: ICU. Ongoing palliative care discussions.   Critical care time: App 30 min   Timothy Garcia  ACNP Maryanna Shape PCCM Pager [phone] till 1 pm If no answer page [phone] 08/25/2018, 8:59 AM

## 2018-08-25 NOTE — Progress Notes (Signed)
Nutrition Follow-up  DOCUMENTATION CODES:   Not applicable  INTERVENTION:   - TPN line holiday  Tube feeding: - Increase Vital 1.5 to 30 ml/hr (720 ml/day) via OG tube - Add Pro-stat 60 ml BID  Current tube feeding regimen provides 1480 kcal, 109 grams of protein, and 550 ml of H2O (83% of kcal needs, 100% of protein needs).  As able, recommend advancing TF to goal rate of 40 ml/hr. With Pro-stat 60 ml BID, would provide 1840 kcal, 124 grams of protein, and 733 ml free water.  NUTRITION DIAGNOSIS:   Increased nutrient needs related to chronic illness (aspiration PNA, cancer) as evidenced by estimated needs.  Ongoing, being addressed via TF  GOAL:   Patient will meet greater than or equal to 90% of their needs  Progressing  MONITOR:   Labs  REASON FOR ASSESSMENT:   Consult, Ventilator Assessment of nutrition requirement/status, Enteral/tube feeding initiation and management (high blood sugars)  ASSESSMENT:   Pt with PMH of COPD, DM, stomach cancer s/p distal gastrectomy with Roux-en-y on 3/5, unable to place J-tube due to narrow caliber of jejunum therefore no enteral access on chronic TPN, recurrent aspiration from SNF now admitted with hypoxic respiratory failure presumed due to aspiration.  6/26 - trickle TF initiated 6/27 - PICC removed  Palliative care team following pt for discussions regarding [place].  Weight up 2 lbs overall since admission.  CCM increased TF rate to 30 ml/hr this AM. Spoke with CCM who approved adding Pro-stat 60 ml BID but would like to keep rate at 30 ml/hr today. Goal rate is 40 ml/hr.  Discussed pt with RN. Per RN, pt tolerating TF well.  Current TF: Vital 1.5 @ 30 ml/hr, free water 200 ml q 6 hours  Patient is currently intubated on ventilator support. MV: 15.3 L/min Temp (24hrs), Avg:97.9 F (36.6 C), Min:97.2 F (36.2 C), Max:98.6 F (37 C) BP: 112/59 MAP: 75  Medications reviewed and include: SSI, Novolog 1 unit q 4 hours,  Levemir 5 units q 12 hours, Protonix, IV abx  Labs reviewed: sodium 155, chloride 127, hemoglobin 7.8 CBG's: 68-170 x 24 hours  UOP: 1475 ml x 24 hours I/O's: +4.3 L since admit  NUTRITION - FOCUSED PHYSICAL EXAM:    Most Recent Value  Orbital Region  Mild depletion  Upper Arm Region  No depletion  Thoracic and Lumbar Region  No depletion  Buccal Region  Unable to assess  Temple Region  Severe depletion  Clavicle Bone Region  Mild depletion  Clavicle and Acromion Bone Region  Mild depletion  Scapular Bone Region  Unable to assess  Dorsal Hand  Mild depletion  Patellar Region  Mild depletion  Anterior Thigh Region  Mild depletion  Posterior Calf Region  Moderate depletion  Edema (RD Assessment)  None  Hair  Reviewed  Eyes  Reviewed  Mouth  Reviewed  Skin  Reviewed  Nails  Reviewed       Diet Order:   Diet Order            Diet NPO time specified  Diet effective now              EDUCATION NEEDS:   No education needs have been identified at this time  Skin:  Skin Assessment: Reviewed RN Assessment  Last BM:  08/25/18 medium type 6  Height:   Ht Readings from Last 1 Encounters:  08/20/18 5\' 7"  (1.702 m)    Weight:   Wt Readings from Last 1  Encounters:  08/25/18 74.3 kg    Ideal Body Weight:  67.2 kg  BMI:  Body mass index is 25.66 kg/m.  Estimated Nutritional Needs:   Kcal:  6962  Protein:  105-125 grams  Fluid:  > 1.8 L/day    Gaynell Face, MS, RD, LDN Inpatient Clinical Dietitian Pager: [phone] Weekend/After Hours: [phone]

## 2018-08-26 ENCOUNTER — Inpatient Hospital Stay (HOSPITAL_COMMUNITY): Payer: Medicare Other

## 2018-08-26 DIAGNOSIS — R0989 Other specified symptoms and signs involving the circulatory and respiratory systems: Secondary | ICD-10-CM

## 2018-08-26 DIAGNOSIS — B49 Unspecified mycosis: Secondary | ICD-10-CM | POA: Diagnosis present

## 2018-08-26 DIAGNOSIS — B961 Klebsiella pneumoniae [K. pneumoniae] as the cause of diseases classified elsewhere: Secondary | ICD-10-CM

## 2018-08-26 DIAGNOSIS — Z1612 Extended spectrum beta lactamase (ESBL) resistance: Secondary | ICD-10-CM

## 2018-08-26 LAB — BASIC METABOLIC PANEL
Anion gap: 8 (ref 5–15)
BUN: 21 mg/dL (ref 8–23)
CO2: 21 mmol/L — ABNORMAL LOW (ref 22–32)
Calcium: 7.9 mg/dL — ABNORMAL LOW (ref 8.9–10.3)
Chloride: 128 mmol/L — ABNORMAL HIGH (ref 98–111)
Creatinine, Ser: 1.36 mg/dL — ABNORMAL HIGH (ref 0.61–1.24)
GFR calc Af Amer: 56 mL/min — ABNORMAL LOW (ref >60)
GFR calc non Af Amer: 48 mL/min — ABNORMAL LOW (ref >60)
Glucose, Bld: 181 mg/dL — ABNORMAL HIGH (ref 70–99)
Potassium: 3 mmol/L — ABNORMAL LOW (ref 3.5–5.1)
Sodium: 157 mmol/L — ABNORMAL HIGH (ref 135–145)

## 2018-08-26 LAB — CULTURE, RESPIRATORY W GRAM STAIN

## 2018-08-26 LAB — GLUCOSE, CAPILLARY
Glucose-Capillary: 126 mg/dL — ABNORMAL HIGH (ref 70–99)
Glucose-Capillary: 143 mg/dL — ABNORMAL HIGH (ref 70–99)
Glucose-Capillary: 149 mg/dL — ABNORMAL HIGH (ref 70–99)
Glucose-Capillary: 162 mg/dL — ABNORMAL HIGH (ref 70–99)
Glucose-Capillary: 166 mg/dL — ABNORMAL HIGH (ref 70–99)
Glucose-Capillary: 206 mg/dL — ABNORMAL HIGH (ref 70–99)

## 2018-08-26 LAB — CBC WITH DIFFERENTIAL/PLATELET
Abs Immature Granulocytes: 0.12 10*3/uL — ABNORMAL HIGH (ref 0.00–0.07)
Basophils Absolute: 0 10*3/uL (ref 0.0–0.1)
Basophils Relative: 0 %
Eosinophils Absolute: 0.1 10*3/uL (ref 0.0–0.5)
Eosinophils Relative: 1 %
HCT: 25.4 % — ABNORMAL LOW (ref 39.0–52.0)
Hemoglobin: 7.9 g/dL — ABNORMAL LOW (ref 13.0–17.0)
Immature Granulocytes: 1 %
Lymphocytes Relative: 11 %
Lymphs Abs: 1.2 10*3/uL (ref 0.7–4.0)
MCH: 25.5 pg — ABNORMAL LOW (ref 26.0–34.0)
MCHC: 31.1 g/dL (ref 30.0–36.0)
MCV: 81.9 fL (ref 80.0–100.0)
Monocytes Absolute: 0.8 10*3/uL (ref 0.1–1.0)
Monocytes Relative: 7 %
Neutro Abs: 9.2 10*3/uL — ABNORMAL HIGH (ref 1.7–7.7)
Neutrophils Relative %: 80 %
Platelets: 335 10*3/uL (ref 150–400)
RBC: 3.1 MIL/uL — ABNORMAL LOW (ref 4.22–5.81)
RDW: 19.2 % — ABNORMAL HIGH (ref 11.5–15.5)
WBC: 11.5 10*3/uL — ABNORMAL HIGH (ref 4.0–10.5)
nRBC: 0.4 % — ABNORMAL HIGH (ref 0.0–0.2)

## 2018-08-26 LAB — PHOSPHORUS: Phosphorus: 3.3 mg/dL (ref 2.5–4.6)

## 2018-08-26 LAB — MAGNESIUM: Magnesium: 1.8 mg/dL (ref 1.7–2.4)

## 2018-08-26 MED ORDER — METRONIDAZOLE 500 MG PO TABS
500.0000 mg | ORAL_TABLET | Freq: Three times a day (TID) | ORAL | Status: DC
  Filled 2018-08-26 (×2): qty 1

## 2018-08-26 MED ORDER — DEXTROSE 5 % IV SOLN
INTRAVENOUS | Status: DC
  Administered 2018-08-26 – 2018-08-29 (×4): via INTRAVENOUS

## 2018-08-26 MED ORDER — SODIUM CHLORIDE 0.9 % IV SOLN
1.0000 g | Freq: Two times a day (BID) | INTRAVENOUS | Status: AC
  Administered 2018-08-26 – 2018-09-01 (×14): 1 g via INTRAVENOUS
  Filled 2018-08-26 (×15): qty 1

## 2018-08-26 MED ORDER — FLUCONAZOLE 200 MG PO TABS
400.0000 mg | ORAL_TABLET | Freq: Every day | ORAL | Status: DC
  Administered 2018-08-27 – 2018-09-02 (×7): 400 mg
  Filled 2018-08-26 (×8): qty 2

## 2018-08-26 NOTE — Progress Notes (Signed)
NAME:  Timothy Garcia, MRN:  [id number], DOB:  May 20, 1934, LOS: 6 ADMISSION DATE:  08/13/2018, CONSULTATION DATE:  08/20/18 REFERRING MD:  Betsey Holiday  CHIEF COMPLAINT:  SOB   Brief History   Timothy Garcia is a 84 y.o. male who resides at SNF and has hx recurrent aspiration, admitted 6/24 with hypoxic respiratory failure presumed due to aspiration.  Required intubation in ED.   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  Significant Hospital Events   6/25 > admit.  Intubated, culture sent, IV fluids started, antibiotics initiated  Consults:  None.  Procedures:  ETT 6/24 >  5/25 L picc->6/27  Significant Diagnostic Tests:  CXR 6/24 > bilateral opacities R > L with bibasilar atelectasis.  6/26 echo:  1. The left ventricle has normal systolic function with an ejection fraction of 60-65%. The cavity size was normal. Left ventricular diastolic Doppler parameters are consistent with impaired relaxation. No evidence of left ventricular regional wall  motion abnormalities.  2. The right ventricle has normal systolic function. The cavity was normal. There is no increase in right ventricular wall thickness.  3. Left-sided pleural effusion noted.  4. Mild calcification of the mitral valve leaflet. No evidence of mitral valve stenosis. No significant mitral regurgitation.  5. The aortic valve is tricuspid. Moderate calcification of the aortic valve. No stenosis of the aortic valve.  6. The aortic root is normal in size and structure.  7. Normal IVC size. No complete TR doppler jet so unable to estimate PA systolic pressure.  8. No definite vegetation identified but would need TEE to definitively rule out.  Micro Data:  Blood 6/24 > candida albicans Urine 6/25 > neg U. Strep 6/25 > negative U. Legionella 6/25 > negative SARS CoV2 6/24 > negative.  Blood 6/27: ngtd 629 sputum>> Klebsiella pneumoniae>>  Antimicrobials:  Vanc 6/24 > 6/26 Cefepime 6/24 >  6/26 unasyn 6/26-> Fluconazole 6/26->  Interim history/subjective:  Abdomen remains distended and tender.  Objective:  Blood pressure 97/77, pulse 97, temperature 98.2 F (36.8 C), temperature source Axillary, resp. rate (!) 28, height 5\' 7"  (1.702 m), weight 74 kg, SpO2 100 %.    Vent Mode: PRVC FiO2 (%):  [40 %] 40 % Set Rate:  [28 bmp] 28 bmp Vt Set:  [520 mL] 520 mL PEEP:  [8 cmH20] 8 cmH20 Plateau Pressure:  [23 cmH20-29 cmH20] 28 cmH20   Intake/Output Summary (Last 24 hours) at 08/26/2018 0811 Last data filed at 08/26/2018 0700 Gross per 24 hour  Intake 2906.53 ml  Output 1750 ml  Net 1156.53 ml   Filed Weights   08/24/18 0400 08/25/18 0500 08/26/18 0500  Weight: 72.4 kg 74.3 kg 74 kg    Examination: General: Frail elderly male he is failed spontaneous breathing trials daily HEENT: Mild JVD is noted, endotracheal tube is in place. Neuro: Tremulous, follows commands weakly. CV: Heart sounds are irregular PULM: even/non-labored, lungs bilaterally diminished GI distended, faint bowel sounds, painful to touch Extremities: warm/dry, 2+ edema  Skin: no rashes or lesions    Assessment & Plan:   Critically ill due to acute hypoxic respiratory failure requiring intubation - presumed due to aspiration especially in light of underlying hx of recurrent aspiration. Unable to liberate from full mechanical ventilatory support I suspect with multiple comorbidities he would not meet criteria for extubation anytime soon. Palliative care is continued conversation with  the family.   Aspiration pneumonia  Candidemia Antimicrobials and antifungals per ID  cxr personally reviewed and remains with R>L infiltrate  Hx COPD.  Severe with FEV1 less than 30%.  No current evidence of exacerbation Plan Continue bronchodilators   Atrial fibrillation with episodes of RVR  currently blood pressure within goal Plan Controlled ventricular rate Anticoagulation in place with low molecular weight heparin  Chronic HFpEF:  Stable   Anemia acute on chronic: Recent Labs    08/25/18 0722 08/26/18 0530  HGB 7.8* 7.9*  ]  Transfuse per protocol  Hx DVTs / PE - s/p IVC filter and on lovenox. Plan Continue Lovenox   AKI: Suspect ATN Lab Results  Component Value Date   CREATININE 1.36 (H) 08/26/2018   CREATININE 1.22 08/25/2018   CREATININE 1.30 (H) 08/24/2018    Avoid nephrotoxins Continue to monitor creatinine     Hypernatremia:  Recent Labs  Lab 08/24/18 0440 08/25/18 0722 08/26/18 0530  NA 157* 155* 157*    Sodium is rising We will start D5W 08/26/2018   C albicans fungemia PICC line for chronic TPN and was removed 627 Antifungals and antibiotics per ID   Hx DM. CBG (last 3)  Recent Labs    08/25/18 2329 08/26/18 0341 08/26/18 0737  GLUCAP 159* 126* 143*    Episode of hypoglycemia Plan Continue to monitor using sliding scale insulin protocol    Hx gastric adenocarcinoma - on chronic TPN.  Currently on trickle tube feedings as increasing abdominal distention.  Will check abdominal film 08/26/2018 Currently off TPN due to fungemia may need to replace PICC line and restart TPN Check abdominal film for completeness   RUE swelling:  Right upper extremity ultrasound reveals findings consistent with acute superficial vein thrombosis involving the right cephalic vein. Currently on low medical weight heparin 70 mg subcutaneous every 12 hours   Failure to thrive. He has been followed by palliative care in the outpatient setting.  Review of their notes from 6/5 demonstrate ongoing weight loss, poor activity tolerance, also suggests fairly poor insight to his prognosis Plan Palliative care is involved Supportive care   Best Practice:  Diet: tf currently at trickle advancing as tolerated Pain/Anxiety/Delirium protocol (if  indicated): per protocol VAP protocol (if indicated): In place. DVT prophylaxis: SCD's / Lovenox. GI prophylaxis: PPI. Glucose control: SSI. Mobility: Bedrest. Code Status: Full.  Palliative care is involved Family Communication: No family available at this time Disposition: ICU. Ongoing palliative care discussions.  Palliative care spoke to sister is a POA on 08/25/2018 who had very little insight of the fact the patient was on full mechanical ventilatory support.  Ongoing discussions per palliative care.   Critical care time: App 30 min   Richardson Landry Bintou Lafata ACNP Maryanna Shape PCCM Pager [phone] till 1 pm If no answer page [phone] 08/26/2018, 8:11 AM

## 2018-08-26 NOTE — Progress Notes (Signed)
Daily Progress Note   Patient Name: Timothy Garcia       Date: 08/26/2018 DOB: [birth year]  Age: 84 y.o. MRN#: [id number] Attending Physician: Roxanne Mins, MD Primary Care Physician: Rogers Blocker, MD Admit Date: 08/08/2018  Reason for Consultation/Follow-up: Establishing goals of care  Subjective: Lying in bed with eyes open.   ETT in place.  Some nods and blinking on command, but not clear that he is able to have meaningful conversation.  I called and spoke with his sister today via phone.  She reports understanding that he is critically ill, but in my conversation, it appears as though she did not have full understanding of his situation.  When I mentioned working to wean him from the ventilator, she noted that someone told her that he needed "a tube to help him breathe" but she was unaware that this meant that he required mechanical ventilation and was getting ventilator support.  We reviewed again his clinical situation, and she reports understanding need to discuss next steps, but she is hopeful that he can participate in conversation.  She reports that he had told her that he wanted all aggressive measures, and she was able to reach patient's daughter who she reports said to "do everything," but she feels that he may not want some interventions, such as tracheostomy, if they would be permanent and not lead to him being well enough to live independently.  Length of Stay: 6  Current Medications: Scheduled Meds:  . sodium chloride   Intravenous Once  . bethanechol  10 mg Per Tube TID  . chlorhexidine gluconate (MEDLINE KIT)  15 mL Mouth Rinse BID  . Chlorhexidine Gluconate Cloth  6 each Topical Daily  . enoxaparin (LOVENOX) injection  70 mg Subcutaneous Q12H  . feeding supplement (PRO-STAT  SUGAR FREE 64)  60 mL Per Tube BID  . [START ON 08/27/2018] fluconazole  400 mg Per Tube Daily  . free water  300 mL Per Tube Q4H  . insulin aspart  2-6 Units Subcutaneous Q4H  . insulin detemir  5 Units Subcutaneous Q12H  . ipratropium-albuterol  3 mL Nebulization Q6H  . mouth rinse  15 mL Mouth Rinse 10 times per day  . pantoprazole (PROTONIX) IV  40 mg Intravenous QHS  . sodium chloride flush  3 mL Intravenous Once  Continuous Infusions: . dextrose    . dextrose 50 mL/hr at 08/26/18 1900  . feeding supplement (VITAL 1.5 CAL) 30 mL/hr at 08/26/18 1300  . meropenem (MERREM) IV Stopped (08/26/18 1459)    PRN Meds: albuterol, dextrose, fentaNYL (SUBLIMAZE) injection, fentaNYL (SUBLIMAZE) injection, midazolam, sodium chloride flush  Physical Exam         General: Alert, awake, in no acute distress. Seems to nod appropriately to simple questions  HEENT: ETT Heart: Irregular, tachycardic. No murmur appreciated. Lungs: Good air movement, coarse Abdomen: Soft, nontender, nondistended, positive bowel sounds.  Ext: + edema Skin: Warm and dry  Vital Signs: BP 124/63   Pulse (!) 117   Temp 98.5 F (36.9 C) (Axillary)   Resp (!) 23   Ht '5\' 7"'  (1.702 m)   Wt 74 kg   SpO2 98%   BMI 25.55 kg/m  SpO2: SpO2: 98 % O2 Device: O2 Device: Ventilator O2 Flow Rate: O2 Flow Rate (L/min): 15 L/min  Intake/output summary:   Intake/Output Summary (Last 24 hours) at 08/26/2018 2054 Last data filed at 08/26/2018 1900 Gross per 24 hour  Intake 4192.14 ml  Output 1650 ml  Net 2542.14 ml   LBM: Last BM Date: 08/26/18 Baseline Weight: Weight: 73.1 kg Most recent weight: Weight: 74 kg       Palliative Assessment/Data:    Flowsheet Rows     Most Recent Value  Intake Tab  Referral Department  Critical care  Unit at Time of Referral  ICU  Palliative Care Primary Diagnosis  Cancer  Palliative Care Type  New Palliative care  Reason for referral  Clarify Goals of Care  Date first seen by  Palliative Care  08/20/18  Clinical Assessment  Palliative Performance Scale Score  20%  Pain Max last 24 hours  4  Pain Min Last 24 hours  3  Dyspnea Max Last 24 Hours  4  Dyspnea Min Last 24 hours  3  Psychosocial & Spiritual Assessment  Palliative Care Outcomes      Patient Active Problem List   Diagnosis Date Noted  . Fungemia 08/26/2018  . Acute hypoxemic respiratory failure ([place]) 08/20/2018  . Aspiration pneumonia of both lower lobes ([place])   . Palliative care by specialist   . Goals of care, counseling/discussion   . Sepsis ([place]) 07/16/2018  . Hyperglycemia 07/16/2018  . Benign essential HTN 07/16/2018  . AMS (altered mental status) 07/16/2018  . COPD with acute exacerbation ([place]) 07/16/2018  . CKD (chronic kidney disease) stage 3, GFR 30-59 ml/min ([facility]) 07/16/2018  . Emphysema of lung ([place]) 07/16/2018  . NSVT (nonsustained ventricular tachycardia) ([place]) 07/16/2018    Palliative Care Assessment & Plan   Patient Profile:    Assessment:  Torrence Hammack a 84 y.o.malewho resides at [facility] and has hx recurrent aspiration, admitted 6/24 with hypoxic respiratory failure presumed due to aspiration. Required intubation in ED. 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  The patient remains intubated, PMT consult for ongoing goals of care discussions.   Recommendations/Plan:  Continue current mode of care  Discussed with Dr. Maryjean Ka and bedside RN.  Called and discussed with his sister the concern that he is never going to reach a point where he is able to be successfully extubated.  She asks if he will "make it" and I shared that I believe he will not survive  this hospitalization.  She expressed again wanting him to make his own decisions and I talked with her about my attempt to address with him today.  While  he does seem to nod appropriately to simple questions, I do not think he has insight into the severity of his situation.    Will reach out again to sister tomorrow to continue conversation.  ? If would benefit from in person meeting with him and sister at bedside.   Goals of Care and Additional Recommendations:  Limitations on Scope of Treatment: Full Scope Treatment  Code Status:    Code Status Orders  (From admission, onward)         Start     Ordered   08/20/18 0153  Full code  Continuous     08/20/18 0154        Code Status History    Date Active Date Inactive Code Status Order ID Comments User Context   07/16/2018 2113 07/21/2018 2110 Full Code [id number]  Elwyn Reach, MD Inpatient   05/20/2018 2308 06/29/2018 1744 Full Code [id number]  Horald Chestnut Inpatient   Advance Care Planning Activity       Prognosis:   Unable to determine Guarded  Discharge Planning:  To Be Determined  Care plan was discussed with patient (intubated) and sister  Thank you for allowing the Palliative Medicine Team to assist in the care of this patient.   Time In: 1840 Time Out: 1920 Total Time 40 Prolonged Time Billed  no       Greater than 50%  of this time was spent counseling and coordinating care related to the above assessment and plan.  Micheline Rough, MD [place] Team [phone]  Please contact Palliative Medicine Team phone at [phone] for questions and concerns.

## 2018-08-26 NOTE — Progress Notes (Signed)
Patient ID: Timothy Garcia, male   DOB: Jan 21, 1935, 84 y.o.   MRN: [id number]         [facility]  Date of Admission:  08/23/2018   Total days of antibiotics 8        Day 6 fluconazole        Day 1 meropenem         ASSESSMENT: Timothy Garcia continues to do poorly on therapy for aspiration pneumonia and fungemia.  His most recent sputum culture grew ESBL producing Klebsiella so we changed him to meropenem today.  Repeat blood cultures on 08/22/2018 are negative.  He needs 2 weeks of fluconazole after that negative culture (through 23-Sep-2018).  PLAN: 1. Continue meropenem and fluconazole  Principal Problem:   Aspiration pneumonia of both lower lobes (HCC) Active Problems:   Acute hypoxemic respiratory failure (HCC)   Fungemia   Palliative care by specialist   Goals of care, counseling/discussion   Scheduled Meds: . sodium chloride   Intravenous Once  . bethanechol  10 mg Per Tube TID  . chlorhexidine gluconate (MEDLINE KIT)  15 mL Mouth Rinse BID  . Chlorhexidine Gluconate Cloth  6 each Topical Daily  . enoxaparin (LOVENOX) injection  70 mg Subcutaneous Q12H  . feeding supplement (PRO-STAT SUGAR FREE 64)  60 mL Per Tube BID  . [START ON 08/27/2018] fluconazole  400 mg Per Tube Daily  . free water  300 mL Per Tube Q4H  . insulin aspart  2-6 Units Subcutaneous Q4H  . insulin detemir  5 Units Subcutaneous Q12H  . ipratropium-albuterol  3 mL Nebulization Q6H  . mouth rinse  15 mL Mouth Rinse 10 times per day  . pantoprazole (PROTONIX) IV  40 mg Intravenous QHS  . sodium chloride flush  3 mL Intravenous Once   Continuous Infusions: . dextrose    . dextrose 50 mL/hr at 08/26/18 1200  . feeding supplement (VITAL 1.5 CAL) 30 mL/hr at 08/26/18 0800  . meropenem (MERREM) IV 1 g (08/26/18 1429)   PRN Meds:.albuterol, dextrose, fentaNYL (SUBLIMAZE) injection, fentaNYL (SUBLIMAZE) injection, midazolam, sodium chloride flush  Review of Systems: Review of Systems   Unable to perform ROS: Intubated    Allergies  Allergen Reactions  . Ace Inhibitors Other (See Comments)    Unable to recall Unable to recall   . Iothalamate Other (See Comments)    Patient does not know if allergic to this; able to receive current IV contrast (Omnipaque 350) without adverse effects     OBJECTIVE: Vitals:   08/26/18 1100 08/26/18 1141 08/26/18 1200 08/26/18 1445  BP: 111/64  112/64   Pulse: 100  (!) 103   Resp: (!) 25  (!) 23   Temp:   98 F (36.7 C)   TempSrc:   Axillary   SpO2: 100% 100% 100% 100%  Weight:      Height:       Body mass index is 25.55 kg/m.  Physical Exam Constitutional:      Comments: Patient is poorly responsive and on ventilator.  Cardiovascular:     Rate and Rhythm: Normal rate and regular rhythm.     Heart sounds: No murmur.  Pulmonary:     Breath sounds: Rales present.  Abdominal:     Palpations: Abdomen is soft.  Skin:    Comments: 1 IV in the left wrist.     Lab Results Lab Results  Component Value Date   WBC 11.5 (H) 08/26/2018   HGB 7.9 (  L) 08/26/2018   HCT 25.4 (L) 08/26/2018   MCV 81.9 08/26/2018   PLT 335 08/26/2018    Lab Results  Component Value Date   CREATININE 1.36 (H) 08/26/2018   BUN 21 08/26/2018   NA 157 (H) 08/26/2018   K 3.0 (L) 08/26/2018   CL 128 (H) 08/26/2018   CO2 21 (L) 08/26/2018    Lab Results  Component Value Date   ALT 43 08/24/2018   AST 31 08/24/2018   ALKPHOS 419 (H) 08/24/2018   BILITOT 0.4 08/24/2018     Microbiology: Recent Results (from the past 240 hour(s))  SARS Coronavirus 2 (CEPHEID- Performed in [facility] hospital lab), Hosp Order     Status: None   Collection Time: 08/18/2018 10:38 PM   Specimen: Nasopharyngeal Swab  Result Value Ref Range Status   SARS Coronavirus 2 NEGATIVE NEGATIVE Final    Comment: (NOTE) If result is NEGATIVE SARS-CoV-2 target nucleic acids are NOT DETECTED. The SARS-CoV-2 RNA is generally detectable in upper and lower  respiratory  specimens during the acute phase of infection. The lowest  concentration of SARS-CoV-2 viral copies this assay can detect is 250  copies / mL. A negative result does not preclude SARS-CoV-2 infection  and should not be used as the sole basis for treatment or other  patient management decisions.  A negative result may occur with  improper specimen collection / handling, submission of specimen other  than nasopharyngeal swab, presence of viral mutation(s) within the  areas targeted by this assay, and inadequate number of viral copies  (<250 copies / mL). A negative result must be combined with clinical  observations, patient history, and epidemiological information. If result is POSITIVE SARS-CoV-2 target nucleic acids are DETECTED. The SARS-CoV-2 RNA is generally detectable in upper and lower  respiratory specimens dur ing the acute phase of infection.  Positive  results are indicative of active infection with SARS-CoV-2.  Clinical  correlation with patient history and other diagnostic information is  necessary to determine patient infection status.  Positive results do  not rule out bacterial infection or co-infection with other viruses. If result is PRESUMPTIVE POSTIVE SARS-CoV-2 nucleic acids MAY BE PRESENT.   A presumptive positive result was obtained on the submitted specimen  and confirmed on repeat testing.  While 2019 novel coronavirus  (SARS-CoV-2) nucleic acids may be present in the submitted sample  additional confirmatory testing may be necessary for epidemiological  and / or clinical management purposes  to differentiate between  SARS-CoV-2 and other Sarbecovirus currently known to infect humans.  If clinically indicated additional testing with an alternate test  methodology [phone]) is advised. The SARS-CoV-2 RNA is generally  detectable in upper and lower respiratory sp ecimens during the acute  phase of infection. The expected result is Negative. Fact Sheet for  Patients:  StrictlyIdeas.no Fact Sheet for Healthcare Providers: BankingDealers.co.za This test is not yet approved or cleared by the [place] FDA and has been authorized for detection and/or diagnosis of SARS-CoV-2 by FDA under an Emergency Use Authorization (EUA).  This EUA will remain in effect (meaning this test can be used) for the duration of the COVID-19 declaration under Section 564(b)(1) of the Act, 21 U.S.C. section 360bbb-3(b)(1), unless the authorization is terminated or revoked sooner. Performed at [facility], [place] [address], [place], [place] [address]   Blood Culture (routine x 2)     Status: Abnormal (Preliminary result)   Collection Time: 08/02/2018 11:15 PM   Specimen: BLOOD  RIGHT HAND  Result Value Ref Range Status   Specimen Description BLOOD RIGHT HAND  Final   Special Requests   Final    BOTTLES DRAWN AEROBIC ONLY Blood Culture results may not be optimal due to an inadequate volume of blood received in culture bottles   Culture  Setup Time (A)  Final    YEAST AEROBIC BOTTLE ONLY CRITICAL VALUE NOTED.  VALUE IS CONSISTENT WITH PREVIOUSLY REPORTED AND CALLED VALUE.    Culture (A)  Final    CANDIDA ALBICANS Sent to [place] for further susceptibility testing. Performed at [facility], [place] [address] [place], [place] [address]    Report Status PENDING  Incomplete  Blood Culture (routine x 2)     Status: Abnormal (Preliminary result)   Collection Time: 08/04/2018 11:16 PM   Specimen: BLOOD RIGHT HAND  Result Value Ref Range Status   Specimen Description BLOOD RIGHT HAND  Final   Special Requests   Final    BOTTLES DRAWN AEROBIC AND ANAEROBIC Blood Culture results may not be optimal due to an inadequate volume of blood received in culture bottles   Culture  Setup Time   Final    AEROBIC BOTTLE ONLY YEAST CRITICAL RESULT CALLED TO, READ BACK BY AND VERIFIED WITH: PHARMD L EAY [id number] AT 720 AM BY CM  GRAM POSITIVE RODS ANAEROBIC BOTTLE ONLY CRITICAL RESULT CALLED TO, READ BACK BY AND VERIFIED WITH: PHARMD KIMBERLY HAMMONS [phone] FCP     Culture (A)  Final    CANDIDA ALBICANS Sent to [place] for further susceptibility testing. LACTOBACILLUS SPECIES Standardized susceptibility testing for this organism is not available. Performed at [facility], [place] [address] [place], [place] [address]    Report Status PENDING  Incomplete  Blood Culture ID Panel (Reflexed)     Status: Abnormal   Collection Time: 08/15/2018 11:16 PM  Result Value Ref Range Status   Enterococcus species NOT DETECTED NOT DETECTED Final   Listeria monocytogenes NOT DETECTED NOT DETECTED Final   Staphylococcus species NOT DETECTED NOT DETECTED Final   Staphylococcus aureus (BCID) NOT DETECTED NOT DETECTED Final   Streptococcus species NOT DETECTED NOT DETECTED Final   Streptococcus agalactiae NOT DETECTED NOT DETECTED Final   Streptococcus pneumoniae NOT DETECTED NOT DETECTED Final   Streptococcus pyogenes NOT DETECTED NOT DETECTED Final   Acinetobacter baumannii NOT DETECTED NOT DETECTED Final   Enterobacteriaceae species NOT DETECTED NOT DETECTED Final   Enterobacter cloacae complex NOT DETECTED NOT DETECTED Final   Escherichia coli NOT DETECTED NOT DETECTED Final   Klebsiella oxytoca NOT DETECTED NOT DETECTED Final   Klebsiella pneumoniae NOT DETECTED NOT DETECTED Final   Proteus species NOT DETECTED NOT DETECTED Final   Serratia marcescens NOT DETECTED NOT DETECTED Final   Haemophilus influenzae NOT DETECTED NOT DETECTED Final   Neisseria meningitidis NOT DETECTED NOT DETECTED Final   Pseudomonas aeruginosa NOT DETECTED NOT DETECTED Final   Candida albicans DETECTED (A) NOT DETECTED Final    Comment: CRITICAL RESULT CALLED TO, READ BACK BY AND VERIFIED WITH: PHARMD L SEAY [id number] AT 720 AM BY CM    Candida glabrata NOT DETECTED NOT DETECTED Final   Candida krusei NOT DETECTED NOT DETECTED Final    Candida parapsilosis NOT DETECTED NOT DETECTED Final   Candida tropicalis NOT DETECTED NOT DETECTED Final    Comment: Performed at [facility], [address] [place], [place] [address]  MRSA PCR Screening     Status: None  Collection Time: 08/20/18  4:04 AM   Specimen: Nasopharyngeal  Result Value Ref Range Status   MRSA by PCR NEGATIVE NEGATIVE Final    Comment:        The GeneXpert MRSA Assay (FDA approved for NASAL specimens only), is one component of a comprehensive MRSA colonization surveillance program. It is not intended to diagnose MRSA infection nor to guide or monitor treatment for MRSA infections. Performed at [facility], [place] [address] [place], [place] [address]   Urine culture     Status: None   Collection Time: 08/20/18  5:34 AM   Specimen: Urine, Random  Result Value Ref Range Status   Specimen Description URINE, RANDOM  Final   Special Requests NONE  Final   Culture   Final    NO GROWTH Performed at [facility], [place] [address], [place], [place] [address]    Report Status 08/21/2018 FINAL  Final  Culture, blood (Routine X 2) w Reflex to ID Panel     Status: None (Preliminary result)   Collection Time: 08/22/18  2:20 PM   Specimen: BLOOD RIGHT HAND  Result Value Ref Range Status   Specimen Description BLOOD RIGHT HAND  Final   Special Requests   Final    BOTTLES DRAWN AEROBIC ONLY Blood Culture results may not be optimal due to an inadequate volume of blood received in culture bottles   Culture   Final    NO GROWTH 4 DAYS Performed at [facility], [place] [address] [place], [place] [address]    Report Status PENDING  Incomplete  Culture, respiratory     Status: None   Collection Time: 08/24/18  9:27 AM   Specimen: Tracheal Aspirate; Respiratory  Result Value Ref Range Status   Specimen Description TRACHEAL ASPIRATE  Final   Special Requests NONE  Final   Gram Stain   Final    MODERATE WBC PRESENT, PREDOMINANTLY PMN  MODERATE GRAM NEGATIVE RODS RARE YEAST Performed at [facility], [address], [place], [place] [address]    Culture   Final    MODERATE KLEBSIELLA PNEUMONIAE Confirmed Extended Spectrum Beta-Lactamase Producer (ESBL).  In bloodstream infections from ESBL organisms, carbapenems are preferred over piperacillin/tazobactam. They are shown to have a lower risk of mortality.    Report Status 08/26/2018 FINAL  Final   Organism ID, Bacteria KLEBSIELLA PNEUMONIAE  Final      Susceptibility   Klebsiella pneumoniae - MIC*    AMPICILLIN >=32 RESISTANT Resistant     CEFAZOLIN >=64 RESISTANT Resistant     CEFEPIME >=64 RESISTANT Resistant     CEFTAZIDIME >=64 RESISTANT Resistant     CEFTRIAXONE >=64 RESISTANT Resistant     CIPROFLOXACIN 1 SENSITIVE Sensitive     GENTAMICIN <=1 SENSITIVE Sensitive     IMIPENEM <=0.25 SENSITIVE Sensitive     TRIMETH/SULFA >=320 RESISTANT Resistant     AMPICILLIN/SULBACTAM >=32 RESISTANT Resistant     PIP/TAZO >=128 RESISTANT Resistant     Extended ESBL POSITIVE Resistant     * MODERATE KLEBSIELLA PNEUMONIAE    Michel Bickers, MD [facility] [place] [phone] pager   [phone] cell 08/26/2018, 2:51 PM

## 2018-08-26 DEATH — deceased

## 2018-08-27 ENCOUNTER — Inpatient Hospital Stay (HOSPITAL_COMMUNITY): Payer: Medicare Other

## 2018-08-27 DIAGNOSIS — Z91041 Radiographic dye allergy status: Secondary | ICD-10-CM

## 2018-08-27 LAB — GLUCOSE, CAPILLARY
Glucose-Capillary: 191 mg/dL — ABNORMAL HIGH (ref 70–99)
Glucose-Capillary: 190 mg/dL — ABNORMAL HIGH (ref 70–99)
Glucose-Capillary: 248 mg/dL — ABNORMAL HIGH (ref 70–99)
Glucose-Capillary: 163 mg/dL — ABNORMAL HIGH (ref 70–99)
Glucose-Capillary: 215 mg/dL — ABNORMAL HIGH (ref 70–99)
Glucose-Capillary: 208 mg/dL — ABNORMAL HIGH (ref 70–99)

## 2018-08-27 LAB — CBC WITH DIFFERENTIAL/PLATELET
Abs Immature Granulocytes: 0.11 10*3/uL — ABNORMAL HIGH (ref 0.00–0.07)
Basophils Absolute: 0 10*3/uL (ref 0.0–0.1)
Basophils Relative: 0 %
Eosinophils Absolute: 0.1 10*3/uL (ref 0.0–0.5)
Eosinophils Relative: 1 %
HCT: 23.9 % — ABNORMAL LOW (ref 39.0–52.0)
Hemoglobin: 7.4 g/dL — ABNORMAL LOW (ref 13.0–17.0)
Immature Granulocytes: 1 %
Lymphocytes Relative: 9 %
Lymphs Abs: 1 10*3/uL (ref 0.7–4.0)
MCH: 25.4 pg — ABNORMAL LOW (ref 26.0–34.0)
MCHC: 31 g/dL (ref 30.0–36.0)
MCV: 82.1 fL (ref 80.0–100.0)
Monocytes Absolute: 0.7 10*3/uL (ref 0.1–1.0)
Monocytes Relative: 6 %
Neutro Abs: 9.9 10*3/uL — ABNORMAL HIGH (ref 1.7–7.7)
Neutrophils Relative %: 83 %
Platelets: 341 10*3/uL (ref 150–400)
RBC: 2.91 MIL/uL — ABNORMAL LOW (ref 4.22–5.81)
RDW: 19.8 % — ABNORMAL HIGH (ref 11.5–15.5)
WBC: 11.8 10*3/uL — ABNORMAL HIGH (ref 4.0–10.5)
nRBC: 0.3 % — ABNORMAL HIGH (ref 0.0–0.2)

## 2018-08-27 LAB — BASIC METABOLIC PANEL
Anion gap: 11 (ref 5–15)
BUN: 22 mg/dL (ref 8–23)
CO2: 20 mmol/L — ABNORMAL LOW (ref 22–32)
Calcium: 7.8 mg/dL — ABNORMAL LOW (ref 8.9–10.3)
Chloride: 124 mmol/L — ABNORMAL HIGH (ref 98–111)
Creatinine, Ser: 1.39 mg/dL — ABNORMAL HIGH (ref 0.61–1.24)
GFR calc Af Amer: 54 mL/min — ABNORMAL LOW (ref >60)
GFR calc non Af Amer: 47 mL/min — ABNORMAL LOW (ref >60)
Glucose, Bld: 188 mg/dL — ABNORMAL HIGH (ref 70–99)
Potassium: 2.6 mmol/L — CL (ref 3.5–5.1)
Sodium: 155 mmol/L — ABNORMAL HIGH (ref 135–145)

## 2018-08-27 LAB — PHOSPHORUS: Phosphorus: 3.1 mg/dL (ref 2.5–4.6)

## 2018-08-27 LAB — CULTURE, BLOOD (ROUTINE X 2): Culture: NO GROWTH

## 2018-08-27 LAB — MAGNESIUM: Magnesium: 1.8 mg/dL (ref 1.7–2.4)

## 2018-08-27 MED ORDER — POTASSIUM CHLORIDE 20 MEQ/15ML (10%) PO SOLN
40.0000 meq | Freq: Once | ORAL | Status: AC
  Administered 2018-08-27: 40 meq
  Filled 2018-08-27: qty 30

## 2018-08-27 MED ORDER — POTASSIUM CHLORIDE 20 MEQ/15ML (10%) PO SOLN
40.0000 meq | Freq: Two times a day (BID) | ORAL | Status: AC
  Administered 2018-08-27 (×2): 40 meq
  Filled 2018-08-27 (×2): qty 30

## 2018-08-27 MED ORDER — INSULIN DETEMIR 100 UNIT/ML ~~LOC~~ SOLN
8.0000 [IU] | Freq: Two times a day (BID) | SUBCUTANEOUS | Status: DC
  Administered 2018-08-27 – 2018-08-29 (×6): 8 [IU] via SUBCUTANEOUS
  Filled 2018-08-27 (×7): qty 0.08

## 2018-08-27 MED ORDER — SODIUM CHLORIDE 0.9 % IV SOLN
INTRAVENOUS | Status: DC | PRN
  Administered 2018-08-27: 250 mL via INTRAVENOUS

## 2018-08-27 NOTE — Progress Notes (Signed)
After oral care and subglottic suctioning of thick secretion, patient had tube feeding emesis. Turned off tube feeding and connecting LWS to OGT. Pt abdomen distended, but soft with bowel sounds and output in rectal tube. Notified [facility]Link. Will watch closely and resume TF if possible.

## 2018-08-27 NOTE — Progress Notes (Signed)
Patient ID: Timothy Garcia, male   DOB: 1934/10/02, 84 y.o.   MRN: [id number]         [facility]  Date of Admission:  07/30/2018   Total days of antibiotics 9        Day 7 fluconazole        Day 2 meropenem         ASSESSMENT: Mr. Onorato continues to do poorly on therapy for aspiration pneumonia and fungemia.  Recommend continuing meropenem for 5 more days.  Repeat blood cultures on 08/22/2018 are negative.  He needs 2 weeks of fluconazole after that negative culture (through 09-30-2018).  PLAN: 1. Continue meropenem through 09/01/2018 and fluconazole through 30-Sep-2018 2. I will sign off now  Principal Problem:   Aspiration pneumonia of both lower lobes ([place]) Active Problems:   Acute hypoxemic respiratory failure (HCC)   Fungemia   Palliative care by specialist   Goals of care, counseling/discussion   Scheduled Meds: . sodium chloride   Intravenous Once  . bethanechol  10 mg Per Tube TID  . chlorhexidine gluconate (MEDLINE KIT)  15 mL Mouth Rinse BID  . Chlorhexidine Gluconate Cloth  6 each Topical Daily  . enoxaparin (LOVENOX) injection  70 mg Subcutaneous Q12H  . feeding supplement (PRO-STAT SUGAR FREE 64)  60 mL Per Tube BID  . fluconazole  400 mg Per Tube Daily  . free water  300 mL Per Tube Q4H  . insulin aspart  2-6 Units Subcutaneous Q4H  . insulin detemir  8 Units Subcutaneous Q12H  . ipratropium-albuterol  3 mL Nebulization Q6H  . mouth rinse  15 mL Mouth Rinse 10 times per day  . pantoprazole (PROTONIX) IV  40 mg Intravenous QHS  . potassium chloride  40 mEq Per Tube BID  . sodium chloride flush  3 mL Intravenous Once   Continuous Infusions: . sodium chloride Stopped (08/27/18 1150)  . dextrose    . dextrose 50 mL/hr at 08/27/18 1400  . feeding supplement (VITAL 1.5 CAL) 1,000 mL (08/26/18 2235)  . meropenem (MERREM) IV Stopped (08/27/18 1020)   PRN Meds:.sodium chloride, albuterol, dextrose, fentaNYL (SUBLIMAZE) injection, fentaNYL  (SUBLIMAZE) injection, midazolam, sodium chloride flush  Review of Systems: Review of Systems  Unable to perform ROS: Intubated    Allergies  Allergen Reactions  . Ace Inhibitors Other (See Comments)    Unable to recall Unable to recall   . Iothalamate Other (See Comments)    Patient does not know if allergic to this; able to receive current IV contrast (Omnipaque 350) without adverse effects     OBJECTIVE: Vitals:   08/27/18 1155 08/27/18 1200 08/27/18 1300 08/27/18 1400  BP:  125/75 109/66 118/64  Pulse:  (!) 109 (!) 117 (!) 102  Resp:  (!) 29 (!) 22 20  Temp: 98.5 F (36.9 C)     TempSrc: Axillary     SpO2:  96% 98% 97%  Weight:      Height:       Body mass index is 26.38 kg/m.  Physical Exam Constitutional:      Comments: He is more alert today.  He remains on the ventilator.  Cardiovascular:     Rate and Rhythm: Normal rate and regular rhythm.     Heart sounds: No murmur.  Pulmonary:     Breath sounds: Normal breath sounds. No rales.  Abdominal:     Palpations: Abdomen is soft.     Lab Results Lab Results  Component  Value Date   WBC 11.8 (H) 08/27/2018   HGB 7.4 (L) 08/27/2018   HCT 23.9 (L) 08/27/2018   MCV 82.1 08/27/2018   PLT 341 08/27/2018    Lab Results  Component Value Date   CREATININE 1.39 (H) 08/27/2018   BUN 22 08/27/2018   NA 155 (H) 08/27/2018   K 2.6 (LL) 08/27/2018   CL 124 (H) 08/27/2018   CO2 20 (L) 08/27/2018    Lab Results  Component Value Date   ALT 43 08/24/2018   AST 31 08/24/2018   ALKPHOS 419 (H) 08/24/2018   BILITOT 0.4 08/24/2018     Microbiology: Recent Results (from the past 240 hour(s))  SARS Coronavirus 2 (CEPHEID- Performed in [place] hospital lab), Hosp Order     Status: None   Collection Time: 08/04/2018 10:38 PM   Specimen: Nasopharyngeal Swab  Result Value Ref Range Status   SARS Coronavirus 2 NEGATIVE NEGATIVE Final    Comment: (NOTE) If result is NEGATIVE SARS-CoV-2 target nucleic acids are  NOT DETECTED. The SARS-CoV-2 RNA is generally detectable in upper and lower  respiratory specimens during the acute phase of infection. The lowest  concentration of SARS-CoV-2 viral copies this assay can detect is 250  copies / mL. A negative result does not preclude SARS-CoV-2 infection  and should not be used as the sole basis for treatment or other  patient management decisions.  A negative result may occur with  improper specimen collection / handling, submission of specimen other  than nasopharyngeal swab, presence of viral mutation(s) within the  areas targeted by this assay, and inadequate number of viral copies  (<250 copies / mL). A negative result must be combined with clinical  observations, patient history, and epidemiological information. If result is POSITIVE SARS-CoV-2 target nucleic acids are DETECTED. The SARS-CoV-2 RNA is generally detectable in upper and lower  respiratory specimens dur ing the acute phase of infection.  Positive  results are indicative of active infection with SARS-CoV-2.  Clinical  correlation with patient history and other diagnostic information is  necessary to determine patient infection status.  Positive results do  not rule out bacterial infection or co-infection with other viruses. If result is PRESUMPTIVE POSTIVE SARS-CoV-2 nucleic acids MAY BE PRESENT.   A presumptive positive result was obtained on the submitted specimen  and confirmed on repeat testing.  While 2019 novel coronavirus  (SARS-CoV-2) nucleic acids may be present in the submitted sample  additional confirmatory testing may be necessary for epidemiological  and / or clinical management purposes  to differentiate between  SARS-CoV-2 and other Sarbecovirus currently known to infect humans.  If clinically indicated additional testing with an alternate test  methodology [phone]) is advised. The SARS-CoV-2 RNA is generally  detectable in upper and lower respiratory sp ecimens  during the acute  phase of infection. The expected result is Negative. Fact Sheet for Patients:  StrictlyIdeas.no Fact Sheet for Healthcare Providers: BankingDealers.co.za This test is not yet approved or cleared by the [place] FDA and has been authorized for detection and/or diagnosis of SARS-CoV-2 by FDA under an Emergency Use Authorization (EUA).  This EUA will remain in effect (meaning this test can be used) for the duration of the COVID-19 declaration under Section 564(b)(1) of the Act, 21 U.S.C. section 360bbb-3(b)(1), unless the authorization is terminated or revoked sooner. Performed at [facility], [place] [address], [place], [place] [address]   Blood Culture (routine x 2)     Status: Abnormal (Preliminary  result)   Collection Time: 07/30/2018 11:15 PM   Specimen: BLOOD RIGHT HAND  Result Value Ref Range Status   Specimen Description BLOOD RIGHT HAND  Final   Special Requests   Final    BOTTLES DRAWN AEROBIC ONLY Blood Culture results may not be optimal due to an inadequate volume of blood received in culture bottles   Culture  Setup Time (A)  Final    YEAST AEROBIC BOTTLE ONLY CRITICAL VALUE NOTED.  VALUE IS CONSISTENT WITH PREVIOUSLY REPORTED AND CALLED VALUE.    Culture (A)  Final    CANDIDA ALBICANS Sent to [place] for further susceptibility testing. Performed at [facility], [place] [address], [place], [place] [address]    Report Status PENDING  Incomplete  Blood Culture (routine x 2)     Status: Abnormal (Preliminary result)   Collection Time: 08/16/2018 11:16 PM   Specimen: BLOOD RIGHT HAND  Result Value Ref Range Status   Specimen Description BLOOD RIGHT HAND  Final   Special Requests   Final    BOTTLES DRAWN AEROBIC AND ANAEROBIC Blood Culture results may not be optimal due to an inadequate volume of blood received in culture bottles   Culture  Setup Time   Final    AEROBIC BOTTLE ONLY YEAST CRITICAL  RESULT CALLED TO, READ BACK BY AND VERIFIED WITH: PHARMD L EAY [id number] AT 720 AM BY CM GRAM POSITIVE RODS ANAEROBIC BOTTLE ONLY CRITICAL RESULT CALLED TO, READ BACK BY AND VERIFIED WITH: PHARMD KIMBERLY HAMMONS [id number] FCP     Culture (A)  Final    CANDIDA ALBICANS Sent to [place] for further susceptibility testing. LACTOBACILLUS SPECIES Standardized susceptibility testing for this organism is not available. Performed at [facility], [place] [address] [place], [place] [address]    Report Status PENDING  Incomplete  Blood Culture ID Panel (Reflexed)     Status: Abnormal   Collection Time: 08/03/2018 11:16 PM  Result Value Ref Range Status   Enterococcus species NOT DETECTED NOT DETECTED Final   Listeria monocytogenes NOT DETECTED NOT DETECTED Final   Staphylococcus species NOT DETECTED NOT DETECTED Final   Staphylococcus aureus (BCID) NOT DETECTED NOT DETECTED Final   Streptococcus species NOT DETECTED NOT DETECTED Final   Streptococcus agalactiae NOT DETECTED NOT DETECTED Final   Streptococcus pneumoniae NOT DETECTED NOT DETECTED Final   Streptococcus pyogenes NOT DETECTED NOT DETECTED Final   Acinetobacter baumannii NOT DETECTED NOT DETECTED Final   Enterobacteriaceae species NOT DETECTED NOT DETECTED Final   Enterobacter cloacae complex NOT DETECTED NOT DETECTED Final   Escherichia coli NOT DETECTED NOT DETECTED Final   Klebsiella oxytoca NOT DETECTED NOT DETECTED Final   Klebsiella pneumoniae NOT DETECTED NOT DETECTED Final   Proteus species NOT DETECTED NOT DETECTED Final   Serratia marcescens NOT DETECTED NOT DETECTED Final   Haemophilus influenzae NOT DETECTED NOT DETECTED Final   Neisseria meningitidis NOT DETECTED NOT DETECTED Final   Pseudomonas aeruginosa NOT DETECTED NOT DETECTED Final   Candida albicans DETECTED (A) NOT DETECTED Final    Comment: CRITICAL RESULT CALLED TO, READ BACK BY AND VERIFIED WITH: PHARMD L SEAY [id number] AT 720 AM BY CM    Candida  glabrata NOT DETECTED NOT DETECTED Final   Candida krusei NOT DETECTED NOT DETECTED Final   Candida parapsilosis NOT DETECTED NOT DETECTED Final   Candida tropicalis NOT DETECTED NOT DETECTED Final    Comment: Performed at [facility], [address] [place], [place] [address]  MRSA PCR Screening     Status: None   Collection Time: 08/20/18  4:04 AM   Specimen: Nasopharyngeal  Result Value Ref Range Status   MRSA by PCR NEGATIVE NEGATIVE Final    Comment:        The GeneXpert MRSA Assay (FDA approved for NASAL specimens only), is one component of a comprehensive MRSA colonization surveillance program. It is not intended to diagnose MRSA infection nor to guide or monitor treatment for MRSA infections. Performed at [facility], [place] [address], [place], [place] [address]   Urine culture     Status: None   Collection Time: 08/20/18  5:34 AM   Specimen: Urine, Random  Result Value Ref Range Status   Specimen Description URINE, RANDOM  Final   Special Requests NONE  Final   Culture   Final    NO GROWTH Performed at [facility], [place] [address] [place], [place] [address]    Report Status 08/21/2018 FINAL  Final  Culture, blood (Routine X 2) w Reflex to ID Panel     Status: None   Collection Time: 08/22/18  2:20 PM   Specimen: BLOOD RIGHT HAND  Result Value Ref Range Status   Specimen Description BLOOD RIGHT HAND  Final   Special Requests   Final    BOTTLES DRAWN AEROBIC ONLY Blood Culture results may not be optimal due to an inadequate volume of blood received in culture bottles   Culture   Final    NO GROWTH 5 DAYS Performed at [facility], [place] [address], [place], [place] [address]    Report Status 08/27/2018 FINAL  Final  Culture, respiratory     Status: None   Collection Time: 08/24/18  9:27 AM   Specimen: Tracheal Aspirate; Respiratory  Result Value Ref Range Status   Specimen Description TRACHEAL ASPIRATE  Final   Special Requests NONE   Final   Gram Stain   Final    MODERATE WBC PRESENT, PREDOMINANTLY PMN MODERATE GRAM NEGATIVE RODS RARE YEAST Performed at [facility], [address] [place], [place] [address]    Culture   Final    MODERATE KLEBSIELLA PNEUMONIAE Confirmed Extended Spectrum Beta-Lactamase Producer (ESBL).  In bloodstream infections from ESBL organisms, carbapenems are preferred over piperacillin/tazobactam. They are shown to have a lower risk of mortality.    Report Status 08/26/2018 FINAL  Final   Organism ID, Bacteria KLEBSIELLA PNEUMONIAE  Final      Susceptibility   Klebsiella pneumoniae - MIC*    AMPICILLIN >=32 RESISTANT Resistant     CEFAZOLIN >=64 RESISTANT Resistant     CEFEPIME >=64 RESISTANT Resistant     CEFTAZIDIME >=64 RESISTANT Resistant     CEFTRIAXONE >=64 RESISTANT Resistant     CIPROFLOXACIN 1 SENSITIVE Sensitive     GENTAMICIN <=1 SENSITIVE Sensitive     IMIPENEM <=0.25 SENSITIVE Sensitive     TRIMETH/SULFA >=320 RESISTANT Resistant     AMPICILLIN/SULBACTAM >=32 RESISTANT Resistant     PIP/TAZO >=128 RESISTANT Resistant     Extended ESBL POSITIVE Resistant     * MODERATE KLEBSIELLA PNEUMONIAE    Michel Bickers, MD [facility] [place] [phone] pager   [phone] cell 08/27/2018, 2:55 PM

## 2018-08-27 NOTE — Progress Notes (Addendum)
NAME:  Timothy Garcia, MRN:  [id number], DOB:  04/17/1934, LOS: 7 ADMISSION DATE:  08/08/2018, CONSULTATION DATE:  08/20/18 REFERRING MD:  Betsey Holiday  CHIEF COMPLAINT:  SOB   Brief History   Timothy Garcia is a 84 y.o. male who resides at SNF and has hx recurrent aspiration, admitted 6/24 with hypoxic respiratory failure presumed due to aspiration.  Required intubation in ED.   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  Significant Hospital Events   6/25 > admit.  Intubated, culture sent, IV fluids started, antibiotics initiated  Consults:  None.  Procedures:  ETT 6/24 >  5/25 L picc->6/27  Significant Diagnostic Tests:  CXR 6/24 > bilateral opacities R > L with bibasilar atelectasis.  6/26 echo:  1. The left ventricle has normal systolic function with an ejection fraction of 60-65%. The cavity size was normal. Left ventricular diastolic Doppler parameters are consistent with impaired relaxation. No evidence of left ventricular regional wall  motion abnormalities.  2. The right ventricle has normal systolic function. The cavity was normal. There is no increase in right ventricular wall thickness.  3. Left-sided pleural effusion noted.  4. Mild calcification of the mitral valve leaflet. No evidence of mitral valve stenosis. No significant mitral regurgitation.  5. The aortic valve is tricuspid. Moderate calcification of the aortic valve. No stenosis of the aortic valve.  6. The aortic root is normal in size and structure.  7. Normal IVC size. No complete TR doppler jet so unable to estimate PA systolic pressure.  8. No definite vegetation identified but would need TEE to definitively rule out.  Micro Data:  Blood 6/24 > candida albicans Urine 6/25 > neg U. Strep 6/25 > negative U. Legionella 6/25 > negative SARS CoV2 6/24 > negative.  Blood 6/27: >> Negative 629 sputum>> Klebsiella pneumoniae>> sensitive to meropenem  Antimicrobials:  Vanc 6/24 > 6/26 Cefepime 6/24 >  6/26 unasyn 6/26-> 08/26/2018 Fluconazole 6/26-> Meropenem 08/26/2018>>   Interim history/subjective:  Abdomen remains distended and tender.  Objective:  Blood pressure 126/69, pulse (!) 112, temperature 98.7 F (37.1 C), temperature source Axillary, resp. rate (!) 28, height 5\' 7"  (1.702 m), weight 76.4 kg, SpO2 98 %.    Vent Mode: PRVC FiO2 (%):  [30 %-40 %] 30 % Set Rate:  [28 bmp] 28 bmp Vt Set:  [520 mL] 520 mL PEEP:  [8 cmH20] 8 cmH20 Plateau Pressure:  [21 cmH20-26 cmH20] 26 cmH20   Intake/Output Summary (Last 24 hours) at 08/27/2018 3354 Last data filed at 08/27/2018 0700 Gross per 24 hour  Intake 4088.31 ml  Output 1750 ml  Net 2338.31 ml   Filed Weights   08/25/18 0500 08/26/18 0500 08/27/18 0500  Weight: 74.3 kg 74 kg 76.4 kg    Examination: General: Elderly male who is responsive HEENT: Tracheal tube is in place Neuro: Tremulous, weak, follows some commands. CV: Sounds are irregular irregular PULM: Diminished throughout GI: Nontender positive bowel sounds tube feedings at 30 Extremities: warm/dry, 1+ edema  Skin: no rashes or lesions     Assessment & Plan:   Critically ill due to acute hypoxic respiratory failure requiring intubation - presumed due to aspiration especially in light of underlying hx of recurrent aspiration. Failed spontaneous breathing trial just secondary to tachypnea I suspect with multiple comorbidities, advanced age and he will need tracheostomy be liberated from mechanical ventilatory support  if at all. Continue palliative care discussions with family   Aspiration pneumonia  Candidemia Antimicrobials and antifungals per ID  08/27/2018 chest x-ray reviewed support apparatus in proper position no significant change  Hx COPD.  Severe with FEV1 less than 30%.  No current evidence of exacerbation  Plan Continue bronchodilators   Atrial fibrillation with episodes of RVR currently blood pressure within goal Plan Controlled ventricular rate Anticoagulation  Chronic HFpEF:  Stable at this point   Anemia acute on chronic: Recent Labs    08/26/18 0530 08/27/18 0651  HGB 7.9* 7.4*  ]  Transfuse per protocol  Hx DVTs / PE - s/p IVC filter and on lovenox. Plan Continue Lovenox   AKI: Suspect ATN Lab Results  Component Value Date   CREATININE 1.36 (H) 08/26/2018   CREATININE 1.22 08/25/2018   CREATININE 1.30 (H) 08/24/2018   Avoid nephrotoxins Monitor creatinine     Hypernatremia:  Recent Labs  Lab 08/24/18 0440 08/25/18 0722 08/26/18 0530  NA 157* 155* 157*    Sodium has been rising Start D5W on 08/26/2018 Continue free water 300 cc every 4 hours   C albicans fungemia PICC line for chronic TPN was removed on 08/22/2018 Continue antifungals and antibiotics per ID service.  Currently on Diflucan and meropenem   Hx DM. CBG (last 3)  Recent Labs    08/26/18 2347 08/27/18 0356 08/27/18 0733  GLUCAP 206* 191* 190*    Episode of hypoglycemia Plan Sliding scale insulin protocol    Hx gastric adenocarcinoma - on chronic TPN.  Currently on tube feedings at 30 Currently off TPN due to fungemia from PICC line. Abdominal film on 08/26/2018 did not demonstrate any radiographic abnormality   RUE swelling:  Right upper extremity ultrasound reveals findings consistent with acute superficial vein thrombosis involving the right cephalic vein. Remains on low molecular weight heparin every 12 hourly   Failure to thrive. He has been followed by palliative care in the outpatient setting.  Review of their notes from 6/5 demonstrate ongoing weight loss, poor activity tolerance, also suggests fairly poor insight to his prognosis Plan Continued conversations between palliative care and POA Supportive care   Best Practice:  Diet: tf currently at trickle  advancing as tolerated Pain/Anxiety/Delirium protocol (if indicated): per protocol VAP protocol (if indicated): In place. DVT prophylaxis: SCD's / Lovenox. GI prophylaxis: PPI. Glucose control: SSI. Mobility: Bedrest. Code Status: Full.  Palliative care is involved Family Communication: No family available at this time Disposition: ICU. Ongoing palliative care discussions.  Palliative care spoke to sister is a POA on 08/25/2018 who had very little insight of the fact the patient was on full mechanical ventilatory support.  Ongoing discussions per palliative care.  08/27/2018 spoke at length with sister [name] concerning possible tracheostomy versus palliative care she is going to call his daughter in [place] for guidance.  She will continue to interact over the phone at this time.   Critical care time: App 30 min   Richardson Landry Jamoni Hewes ACNP Maryanna Shape PCCM Pager [phone] till 1 pm If no answer page [phone] 08/27/2018, 8:22 AM

## 2018-08-27 NOTE — Progress Notes (Signed)
Daily Progress Note   Patient Name: Timothy Garcia       Date: 08/27/2018 DOB: [birth year]  Age: 84 y.o. MRN#: [id number] Attending Physician: Marshell Garfinkel, MD Primary Care Physician: Rogers Blocker, MD Admit Date: 08/21/2018  Reason for Consultation/Follow-up: Establishing goals of care  Subjective: Lying in bed with eyes open.   ETT in place.    He appears more awake and reliable in nodding yes and no to questions today.  I had gentle discussion with him regarding the severity of his condition and that he is not weaning from ventilator support.  He nodded yes to understanding this,    I then reviewed option moving forward regarding potential tracheostomy, which would also result in him living in facility.  He clearly shook his head no.  We discussed alternative of removing ventilatory support and unknown of how long he would survive but that plan in this case would to be not to reinsert tube and he would likely die in a short period of time.  I asked if he understood this and he nodded yes.  I then asked if he needed time to think about this and he nodded yes.  I let him know that I have also been talking with his sister about these things and if she was the person he trusted to help make decisions.  He nodded yes.  I asked if it would be helpful to have his sister present for conversation and making decision and he nodded yes.  I then called and spoke with his sister via phone.  She reports getting update this AM from PCCM and understanding that decisions need to be made regarding care plan moving forward.  She was happy to hear that he appeared to me to understand at least some of what is going on and stated she was not surprised he seemed to indicate he would not want a trach followed by long  term care placement.  I asked her about coming to be present to assist in decision making, and she indicated being hesitant to come to the hospital due to [place] pandemic.  We therefore discussed plan for me to call her from his room on speakerphone so that he could at least hear her voice and try to communicate with him further to see if he is  able to truly participate in conversation.     Length of Stay: 7  Current Medications: Scheduled Meds:   sodium chloride   Intravenous Once   bethanechol  10 mg Per Tube TID   chlorhexidine gluconate (MEDLINE KIT)  15 mL Mouth Rinse BID   Chlorhexidine Gluconate Cloth  6 each Topical Daily   enoxaparin (LOVENOX) injection  70 mg Subcutaneous Q12H   feeding supplement (PRO-STAT SUGAR FREE 64)  60 mL Per Tube BID   fluconazole  400 mg Per Tube Daily   free water  300 mL Per Tube Q4H   insulin aspart  2-6 Units Subcutaneous Q4H   insulin detemir  8 Units Subcutaneous Q12H   ipratropium-albuterol  3 mL Nebulization Q6H   mouth rinse  15 mL Mouth Rinse 10 times per day   pantoprazole (PROTONIX) IV  40 mg Intravenous QHS   potassium chloride  40 mEq Per Tube BID   sodium chloride flush  3 mL Intravenous Once    Continuous Infusions:  sodium chloride Stopped (08/27/18 1150)   dextrose     dextrose 50 mL/hr at 08/27/18 1400   feeding supplement (VITAL 1.5 CAL) 1,000 mL (08/26/18 2235)   meropenem (MERREM) IV Stopped (08/27/18 1020)    PRN Meds: sodium chloride, albuterol, dextrose, fentaNYL (SUBLIMAZE) injection, fentaNYL (SUBLIMAZE) injection, midazolam, sodium chloride flush  Physical Exam         General: Alert, awake, in no acute distress. More interactive today and nods appropriately to simple questions  HEENT: ETT Heart: Irregular, tachycardic. No murmur appreciated. Lungs: Good air movement, coarse Abdomen: Soft, nontender, nondistended, positive bowel sounds.  Ext: + edema Skin: Warm and dry  Vital Signs: BP  118/64    Pulse (!) 102    Temp 98.5 F (36.9 C) (Axillary)    Resp 20    Ht 5' 7" (1.702 m)    Wt 76.4 kg    SpO2 97%    BMI 26.38 kg/m  SpO2: SpO2: 97 % O2 Device: O2 Device: Ventilator O2 Flow Rate: O2 Flow Rate (L/min): 15 L/min  Intake/output summary:   Intake/Output Summary (Last 24 hours) at 08/27/2018 1448 Last data filed at 08/27/2018 1400 Gross per 24 hour  Intake 4676.8 ml  Output 1700 ml  Net 2976.8 ml   LBM: Last BM Date: 08/26/18 Baseline Weight: Weight: 73.1 kg Most recent weight: Weight: 76.4 kg       Palliative Assessment/Data:    Flowsheet Rows     Most Recent Value  Intake Tab  Referral Department  Critical care  Unit at Time of Referral  ICU  Palliative Care Primary Diagnosis  Cancer  Palliative Care Type  New Palliative care  Reason for referral  Clarify Goals of Care  Date first seen by Palliative Care  08/20/18  Clinical Assessment  Palliative Performance Scale Score  20%  Pain Max last 24 hours  4  Pain Min Last 24 hours  3  Dyspnea Max Last 24 Hours  4  Dyspnea Min Last 24 hours  3  Psychosocial & Spiritual Assessment  Palliative Care Outcomes      Patient Active Problem List   Diagnosis Date Noted   Fungemia 08/26/2018   Acute hypoxemic respiratory failure ([place]) 08/20/2018   Aspiration pneumonia of both lower lobes ([place])    Palliative care by specialist    Goals of care, counseling/discussion    Sepsis ([place]) 07/16/2018   Hyperglycemia 07/16/2018   Benign essential HTN 07/16/2018   AMS (  altered mental status) 07/16/2018   COPD with acute exacerbation ([place]) 07/16/2018   CKD (chronic kidney disease) stage 3, GFR 30-59 ml/min ([facility]) 07/16/2018   Emphysema of lung ([place]) 07/16/2018   NSVT (nonsustained ventricular tachycardia) ([place]) 07/16/2018    Palliative Care Assessment & Plan   Patient Profile:    Assessment:  Timothy Smithis a 84 y.o.malewho resides at [facility] and has hx recurrent aspiration, admitted 6/24 with hypoxic  respiratory failure presumed due to aspiration. Required intubation in ED. 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  The patient remains intubated, PMT consult for ongoing goals of care discussions.   Recommendations/Plan:  Continue current mode of care  Discussed again with patient as well as his sister via phone.  I will try to set up a virtual meeting tomorrow to allow her to be part of the conversation and see her brother.  Discussed possible in person meeting, but she would prefer virtual if possible.     Goals of Care and Additional Recommendations:  Limitations on Scope of Treatment: Full Scope Treatment  Code Status:    Code Status Orders  (From admission, onward)         Start     Ordered   08/20/18 0153  Full code  Continuous     08/20/18 0154        Code Status History    Date Active Date Inactive Code Status Order ID Comments User Context   07/16/2018 2113 07/21/2018 2110 Full Code [id number]  Elwyn Reach, MD Inpatient   05/20/2018 2308 06/29/2018 1744 Full Code [id number]  Horald Chestnut Inpatient   Advance Care Planning Activity       Prognosis:   Unable to determine Guarded  Discharge Planning:  To Be Determined  Care plan was discussed with patient (intubated) and sister  Thank you for allowing the Palliative Medicine Team to assist in the care of this patient.   Time In: 1450 Time out 1520 Total Time 30 Prolonged Time Billed  no       Greater than 50%  of this time was spent counseling and coordinating care related to the above assessment and plan.  Micheline Rough, MD [place] Team [phone]  Please contact Palliative Medicine Team phone at [phone] for questions and concerns.

## 2018-08-28 ENCOUNTER — Inpatient Hospital Stay (HOSPITAL_COMMUNITY): Payer: Medicare Other

## 2018-08-28 LAB — CBC WITH DIFFERENTIAL/PLATELET
Abs Immature Granulocytes: 0.1 10*3/uL — ABNORMAL HIGH (ref 0.00–0.07)
Basophils Absolute: 0 10*3/uL (ref 0.0–0.1)
Basophils Relative: 0 %
Eosinophils Absolute: 0.2 10*3/uL (ref 0.0–0.5)
Eosinophils Relative: 2 %
HCT: 23.4 % — ABNORMAL LOW (ref 39.0–52.0)
Hemoglobin: 7.3 g/dL — ABNORMAL LOW (ref 13.0–17.0)
Immature Granulocytes: 1 %
Lymphocytes Relative: 9 %
Lymphs Abs: 1 10*3/uL (ref 0.7–4.0)
MCH: 25.4 pg — ABNORMAL LOW (ref 26.0–34.0)
MCHC: 31.2 g/dL (ref 30.0–36.0)
MCV: 81.5 fL (ref 80.0–100.0)
Monocytes Absolute: 0.6 10*3/uL (ref 0.1–1.0)
Monocytes Relative: 6 %
Neutro Abs: 9.5 10*3/uL — ABNORMAL HIGH (ref 1.7–7.7)
Neutrophils Relative %: 82 %
Platelets: 298 10*3/uL (ref 150–400)
RBC: 2.87 MIL/uL — ABNORMAL LOW (ref 4.22–5.81)
RDW: 19.9 % — ABNORMAL HIGH (ref 11.5–15.5)
WBC: 11.5 10*3/uL — ABNORMAL HIGH (ref 4.0–10.5)
nRBC: 0.3 % — ABNORMAL HIGH (ref 0.0–0.2)

## 2018-08-28 LAB — BASIC METABOLIC PANEL
Anion gap: 7 (ref 5–15)
BUN: 22 mg/dL (ref 8–23)
CO2: 21 mmol/L — ABNORMAL LOW (ref 22–32)
Calcium: 7.9 mg/dL — ABNORMAL LOW (ref 8.9–10.3)
Chloride: 125 mmol/L — ABNORMAL HIGH (ref 98–111)
Creatinine, Ser: 1.35 mg/dL — ABNORMAL HIGH (ref 0.61–1.24)
GFR calc Af Amer: 56 mL/min — ABNORMAL LOW (ref >60)
GFR calc non Af Amer: 49 mL/min — ABNORMAL LOW (ref >60)
Glucose, Bld: 138 mg/dL — ABNORMAL HIGH (ref 70–99)
Potassium: 3 mmol/L — ABNORMAL LOW (ref 3.5–5.1)
Sodium: 153 mmol/L — ABNORMAL HIGH (ref 135–145)

## 2018-08-28 LAB — GLUCOSE, CAPILLARY
Glucose-Capillary: 57 mg/dL — ABNORMAL LOW (ref 70–99)
Glucose-Capillary: 125 mg/dL — ABNORMAL HIGH (ref 70–99)
Glucose-Capillary: 49 mg/dL — ABNORMAL LOW (ref 70–99)
Glucose-Capillary: 145 mg/dL — ABNORMAL HIGH (ref 70–99)
Glucose-Capillary: 211 mg/dL — ABNORMAL HIGH (ref 70–99)
Glucose-Capillary: 194 mg/dL — ABNORMAL HIGH (ref 70–99)
Glucose-Capillary: 206 mg/dL — ABNORMAL HIGH (ref 70–99)

## 2018-08-28 LAB — PHOSPHORUS: Phosphorus: 2.7 mg/dL (ref 2.5–4.6)

## 2018-08-28 LAB — MAGNESIUM: Magnesium: 1.7 mg/dL (ref 1.7–2.4)

## 2018-08-28 MED ORDER — DEXTROSE 50 % IV SOLN
INTRAVENOUS | Status: AC
  Administered 2018-08-28: 50 mL
  Filled 2018-08-28: qty 50

## 2018-08-28 MED ORDER — VITAL 1.5 CAL PO LIQD
1000.0000 mL | ORAL | Status: DC
  Administered 2018-08-28 – 2018-09-01 (×4): 1000 mL
  Filled 2018-08-28 (×5): qty 1000

## 2018-08-28 MED ORDER — POTASSIUM CHLORIDE 10 MEQ/100ML IV SOLN
10.0000 meq | INTRAVENOUS | Status: AC
  Administered 2018-08-28 (×4): 10 meq via INTRAVENOUS
  Filled 2018-08-28 (×4): qty 100

## 2018-08-28 MED ORDER — SODIUM CHLORIDE 0.9% FLUSH: 10.0000 mL | INTRAVENOUS | Status: DC | PRN

## 2018-08-28 MED ORDER — SODIUM CHLORIDE 0.9% FLUSH
10.0000 mL | Freq: Two times a day (BID) | INTRAVENOUS | Status: DC
  Administered 2018-08-28 – 2018-08-29 (×3): 10 mL
  Administered 2018-08-30: 30 mL
  Administered 2018-08-31 – 2018-09-04 (×7): 10 mL

## 2018-08-28 MED ORDER — LABETALOL HCL 5 MG/ML IV SOLN
10.0000 mg | INTRAVENOUS | Status: DC | PRN
  Administered 2018-08-28 – 2018-09-01 (×4): 10 mg via INTRAVENOUS
  Filled 2018-08-28 (×3): qty 4

## 2018-08-28 NOTE — Progress Notes (Signed)
Assisted tele visit to patient with family members and providers.  Terree Gaultney, Aliene Beams, RN

## 2018-08-28 NOTE — Progress Notes (Signed)
NAME:  Timothy Garcia, MRN:  [id number], DOB:  02-21-1935, LOS: 8 ADMISSION DATE:  08/03/2018, CONSULTATION DATE:  08/20/18 REFERRING MD:  Betsey Holiday  CHIEF COMPLAINT:  SOB   Brief History   Timothy Garcia is a 84 y.o. male who resides at SNF and has hx recurrent aspiration, admitted 6/24 with hypoxic respiratory failure presumed due to aspiration.  Required intubation in ED.   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  Significant Hospital Events   6/25 > admit.  Intubated, culture sent, IV fluids started, antibiotics initiated  Consults:  None.  Procedures:  ETT 6/24 >  5/25 L picc->6/27  Significant Diagnostic Tests:  CXR 6/24 > bilateral opacities R > L with bibasilar atelectasis.  6/26 echo:  1. The left ventricle has normal systolic function with an ejection fraction of 60-65%. The cavity size was normal. Left ventricular diastolic Doppler parameters are consistent with impaired relaxation. No evidence of left ventricular regional wall  motion abnormalities.  2. The right ventricle has normal systolic function. The cavity was normal. There is no increase in right ventricular wall thickness.  3. Left-sided pleural effusion noted.  4. Mild calcification of the mitral valve leaflet. No evidence of mitral valve stenosis. No significant mitral regurgitation.  5. The aortic valve is tricuspid. Moderate calcification of the aortic valve. No stenosis of the aortic valve.  6. The aortic root is normal in size and structure.  7. Normal IVC size. No complete TR doppler jet so unable to estimate PA systolic pressure.  8. No definite vegetation identified but would need TEE to definitively rule out.  Micro Data:  Blood 6/24 > candida albicans Urine 6/25 > neg U. Strep 6/25 > negative U. Legionella 6/25 > negative SARS CoV2 6/24 > negative.  Blood 6/27: >> Negative 629 sputum>> Klebsiella pneumoniae>> sensitive to meropenem  Antimicrobials:  Vanc 6/24 > 6/26 Cefepime 6/24 >  6/26 unasyn 6/26-> 08/26/2018 Fluconazole 6/26-> Meropenem 08/26/2018>>   Interim history/subjective:  Abdomen remains distended and tender.  Objective:  Blood pressure 98/62, pulse (!) 108, temperature 97.9 F (36.6 C), temperature source Axillary, resp. rate (!) 28, height 5\' 7"  (1.702 m), weight 77.3 kg, SpO2 95 %.    Vent Mode: PRVC FiO2 (%):  [30 %] 30 % Set Rate:  [28 bmp] 28 bmp Vt Set:  [520 mL] 520 mL PEEP:  [8 cmH20] 8 cmH20 Plateau Pressure:  [20 cmH20-28 cmH20] 28 cmH20   Intake/Output Summary (Last 24 hours) at 08/28/2018 3149 Last data filed at 08/28/2018 0800 Gross per 24 hour  Intake 3684.01 ml  Output 1720 ml  Net 1964.01 ml   Filed Weights   08/26/18 0500 08/27/18 0500 08/28/18 0500  Weight: 74 kg 76.4 kg 77.3 kg    Examination: General: Elderly male who is responsive HEENT: Tracheal tube is in place Neuro: Tremulous, weak, follows commands. Answers Y/N questions appropriately. MAE on request. CNII-XII grossly intact to confrontation CV: Sounds are irregular irregular PULM: Diminished throughout GI: Nontender positive bowel sounds tube feedings at 30 Extremities: warm/dry, 1+ edema  Skin: no rashes or lesions     Assessment & Plan:   Critically ill due to acute hypoxic respiratory failure requiring intubation - presumed due to aspiration especially in light of underlying hx of recurrent aspiration. Failed spontaneous breathing trial just secondary to tachypnea I suspect with multiple comorbidities, advanced age and  he will need tracheostomy be liberated from mechanical ventilatory support if at all. Currently fails all attempts at vent weaning. Discussed w/ patient this morning, who seems to understand. Continue palliative care discussions with family   Aspiration pneumonia  Candidemia Antimicrobials and  antifungals per ID  08/27/2018 chest x-ray reviewed support apparatus in proper position no significant change  Hx COPD.  Severe with FEV1 less than 30%.  No current evidence of exacerbation Plan Continue bronchodilators   Atrial fibrillation with episodes of RVR currently blood pressure within goal Plan Controlled ventricular rate Anticoagulation  Chronic HFpEF:  Stable at this point   Anemia acute on chronic: Recent Labs    08/27/18 0651 08/28/18 0530  HGB 7.4* 7.3*  ]  Transfuse per protocol  Hx DVTs / PE - s/p IVC filter and on lovenox. Plan Continue Lovenox   AKI: Suspect ATN Lab Results  Component Value Date   CREATININE 1.35 (H) 08/28/2018   CREATININE 1.39 (H) 08/27/2018   CREATININE 1.36 (H) 08/26/2018   Avoid nephrotoxins Monitor creatinine     Hypernatremia:  Recent Labs  Lab 08/26/18 0530 08/27/18 0651 08/28/18 0530  NA 157* 155* 153*    Sodium has been rising, no trending down with free water Start D5W on 08/26/2018 Continue free water 300 cc every 4 hours   C albicans fungemia PICC line for chronic TPN was removed on 08/22/2018 Continue antifungals and antibiotics per ID service.  Currently on Diflucan and meropenem. Diflucan to continue until 7/11; meropenem through 7/7   Hx DM. CBG (last 3)  Recent Labs    08/28/18 0335 08/28/18 0408 08/28/18 0713  GLUCAP 49* 125* 145*    Episode of hypoglycemia Plan Sliding scale insulin protocol    Hx gastric adenocarcinoma - on chronic TPN.  Currently on tube feedings at 30 Currently off TPN due to fungemia from PICC line. Abdominal film on 08/26/2018 did not demonstrate any radiographic abnormality   RUE swelling:  Right upper extremity ultrasound reveals findings consistent with acute superficial vein thrombosis involving the right cephalic vein. Remains on low molecular weight heparin every 12 hourly IV placed distal to thrombus   Failure to thrive. He has been followed by  palliative care in the outpatient setting.  Review of their notes from 6/5 demonstrate ongoing weight loss, poor activity tolerance, also suggests fairly poor insight to his prognosis Plan Continued conversations between palliative care and POA Supportive care Pt interactive enough to participate with PT>   Best Practice:  Diet: tf currently at 30/hr advancing as tolerated Pain/Anxiety/Delirium protocol (if indicated): per protocol VAP protocol (if indicated): In place. DVT prophylaxis: SCD's / Lovenox. GI prophylaxis: PPI. Glucose control: SSI. Mobility: Bedrest. Code Status: Full.  Palliative care is involved Family Communication: No family available at this time Disposition: ICU. Ongoing palliative care discussions.  Palliative care spoke to sister is a POA on 08/25/2018 who had very little insight of the fact the patient was on full mechanical ventilatory support.  Ongoing discussions per palliative care.  08/27/2018 spoke at length with sister [name] concerning possible tracheostomy versus palliative care she is going to call his daughter in [place] for guidance.  She will continue to interact over the phone at this time. Per Dr. Domingo Cocking, will have 3 way phone conversation with sister and pt 7/3 or so.   I have independently seen and examined the patient, reviewed data, and developed an assessment and plan. A total of 36 minutes were spent in critical care assessment  and medical decision making. This critical care time does not reflect procedure time, or teaching time or supervisory time of PA/NP/Med student/Med Resident, etc but could involve care discussion time.  Bonna Gains, MD PhD 08/28/18 8:45 AM

## 2018-08-28 NOTE — Progress Notes (Signed)
Nutrition Follow-up  DOCUMENTATION CODES:   Not applicable  INTERVENTION:   - TPN line holiday  - Increase Vital 1.5 to 40 ml/hr (960 ml/day) via OG tube - Pro-stat 60 ml BID  Provides 1840 kcal, 124 grams of protein, and 733 ml free water.  NUTRITION DIAGNOSIS:   Increased nutrient needs related to chronic illness (aspiration PNA, cancer) as evidenced by estimated needs.  Ongoing, being addressed via TF  GOAL:   Patient will meet greater than or equal to 90% of their needs  Progressing  MONITOR:   Labs  REASON FOR ASSESSMENT:   Consult, Ventilator Assessment of nutrition requirement/status, Enteral/tube feeding initiation and management (high blood sugars)  ASSESSMENT:   Pt with PMH of COPD, DM, stomach cancer s/p distal gastrectomy with Roux-en-y on 3/5, unable to place J-tube due to narrow caliber of jejunum therefore no enteral access on chronic TPN, recurrent aspiration from SNF now admitted with hypoxic respiratory failure presumed due to aspiration.  6/26 - trickle TF initiated 6/27 - PICC removed  Pt discussed during ICU rounds and with RN.  Palliative care team following pt for discussions regarding [place]. Discussed pt with RN. Per RN, pt tolerating TF well.  Patient is currently intubated on ventilator support. MV: 15.3 L/min Temp (24hrs), Avg:98.5 F (36.9 C), Min:97.9 F (36.6 C), Max:99.1 F (37.3 C)  Medications reviewed and include: SSI, Novolog 2-6 units q 4 hours, Levemir 8 units q 12 hours 300 ml free water every 4 hours Labs reviewed: sodium 153, K+ 3 (L)  CBG's: [phone]  I/O's: +9.8 L since admit Moderate edema   Diet Order:   Diet Order            Diet NPO time specified  Diet effective now              EDUCATION NEEDS:   No education needs have been identified at this time  Skin:  Skin Assessment: Reviewed RN Assessment  Last BM:  400 ml via rectal tube  Height:   Ht Readings from Last 1 Encounters:  08/20/18  5\' 7"  (1.702 m)    Weight:   Wt Readings from Last 1 Encounters:  08/28/18 77.3 kg    Ideal Body Weight:  67.2 kg  BMI:  Body mass index is 26.69 kg/m.  Estimated Nutritional Needs:   Kcal:  8264  Protein:  105-125 grams  Fluid:  > 1.8 L/day  Maylon Peppers RD, LDN, CNSC [phone] Pager [phone] After Hours Pager

## 2018-08-28 NOTE — Progress Notes (Signed)
Inpatient Diabetes Program Recommendations  AACE/ADA: New Consensus Statement on Inpatient Glycemic Control (2015)  Target Ranges:  Prepandial:   less than 140 mg/dL      Peak postprandial:   less than 180 mg/dL (1-2 hours)      Critically ill patients:  140 - 180 mg/dL   Lab Results  Component Value Date   GLUCAP 145 (H) 08/28/2018   HGBA1C 10.0 (H) 08/20/2018    Review of Glycemic Control Results for Timothy Garcia, Timothy Garcia (MRN [id number]) as of 08/28/2018 11:31  Ref. Range 08/28/2018 03:29 08/28/2018 03:35 08/28/2018 04:08 08/28/2018 07:13  Glucose-Capillary Latest Ref Range: 70 - 99 mg/dL 57 (L) 49 (L) 125 (H) 145 (H)   Diabetes history: Type 2 Dm Outpatient Diabetes medications: novolog 1-9 units TID, Levemir 32 units QD Current orders for Inpatient glycemic control: novolog 2-6 units Q4H, Levemir 8 units BID  Vital 30 ml/hr  Inpatient Diabetes Program Recommendations:    Noted hypoglycemic episode of 49 mg/dL this AM and D5% @ 50 ml/hr was started at 0254. This was the first QHS dose of the Levemir 8 units and noted that correction was given <4 hours apart. Unclear if this contributed.  Consider decreasing Levemir QHS dose to 6 units.   Thanks, Bronson Curb, MSN, RNC-OB Diabetes Coordinator [phone] (8a-5p)

## 2018-08-28 NOTE — Progress Notes (Signed)
Daily Progress Note   Patient Name: Timothy Garcia       Date: 08/28/2018 DOB: 07-10-1934  Age: 84 y.o. MRN#: [id number] Attending Physician: Marshell Garfinkel, MD Primary Care Physician: Rogers Blocker, MD Admit Date: 08/11/2018  Reason for Consultation/Follow-up: Establishing goals of care  Subjective: Lying in bed with eyes open.   ETT in place.    Multiple family members called in via e-link.    We discussed again regarding his clinical condition and plan of care moving forward.  We discussed ventilator support and likelihood that he will have continued dependence on ventilator for the rest of his life.  Discussed regarding options of tracheostomy (with subsequent need for transition to trach vent capable LTC facility) vs one way extubation with understanding he would likely succumb following withdrawal.  Throughout encounter, Timothy Garcia remained engaged in conversation and used nodding to interact appropriately.  At end of conversation, he nodded when asked if he needed time to process options.    Length of Stay: 8  Current Medications: Scheduled Meds:  . sodium chloride   Intravenous Once  . bethanechol  10 mg Per Tube TID  . chlorhexidine gluconate (MEDLINE KIT)  15 mL Mouth Rinse BID  . Chlorhexidine Gluconate Cloth  6 each Topical Daily  . enoxaparin (LOVENOX) injection  70 mg Subcutaneous Q12H  . feeding supplement (PRO-STAT SUGAR FREE 64)  60 mL Per Tube BID  . fluconazole  400 mg Per Tube Daily  . free water  300 mL Per Tube Q4H  . insulin aspart  2-6 Units Subcutaneous Q4H  . insulin detemir  8 Units Subcutaneous Q12H  . ipratropium-albuterol  3 mL Nebulization Q6H  . mouth rinse  15 mL Mouth Rinse 10 times per day  . pantoprazole (PROTONIX) IV  40 mg Intravenous QHS  . sodium  chloride flush  10-40 mL Intracatheter Q12H  . sodium chloride flush  3 mL Intravenous Once    Continuous Infusions: . sodium chloride Stopped (08/28/18 1035)  . dextrose    . dextrose 50 mL/hr at 08/28/18 1900  . feeding supplement (VITAL 1.5 CAL) 1,000 mL (08/28/18 1214)  . meropenem (MERREM) IV 1 g (08/28/18 2144)    PRN Meds: sodium chloride, albuterol, dextrose, fentaNYL (SUBLIMAZE) injection, fentaNYL (SUBLIMAZE) injection, labetalol, midazolam, sodium chloride flush, sodium chloride flush  Physical Exam         General: Alert, awake, in no acute distress. More interactive today and nods appropriately to simple questions  HEENT: ETT Heart: Irregular, tachycardic. No murmur appreciated. Lungs: Good air movement, coarse Abdomen: Soft, nontender, nondistended, positive bowel sounds.  Ext: + edema Skin: Warm and dry  Vital Signs: BP 122/71   Pulse (!) 106   Temp 97.8 F (36.6 C) (Oral)   Resp (!) 26   Ht 5' 7" (1.702 m)   Wt 77.3 kg   SpO2 97%   BMI 26.69 kg/m  SpO2: SpO2: 97 % O2 Device: O2 Device: Ventilator O2 Flow Rate: O2 Flow Rate (L/min): 15 L/min  Intake/output summary:   Intake/Output Summary (Last 24 hours) at 08/28/2018 2303 Last data filed at 08/28/2018 2200 Gross per 24 hour  Intake 3409.42 ml  Output 1685 ml  Net 1724.42 ml   LBM: Last BM Date: 08/28/18 Baseline Weight: Weight: 73.1 kg Most recent weight: Weight: 77.3 kg       Palliative Assessment/Data:    Flowsheet Rows     Most Recent Value  Intake Tab  Referral Department  Critical care  Unit at Time of Referral  ICU  Palliative Care Primary Diagnosis  Cancer  Palliative Care Type  New Palliative care  Reason for referral  Clarify Goals of Care  Date first seen by Palliative Care  08/20/18  Clinical Assessment  Palliative Performance Scale Score  20%  Pain Max last 24 hours  4  Pain Min Last 24 hours  3  Dyspnea Max Last 24 Hours  4  Dyspnea Min Last 24 hours  3  Psychosocial &  Spiritual Assessment  Palliative Care Outcomes      Patient Active Problem List   Diagnosis Date Noted  . Fungemia 08/26/2018  . Acute hypoxemic respiratory failure ([place]) 08/20/2018  . Aspiration pneumonia of both lower lobes ([place])   . Palliative care by specialist   . Goals of care, counseling/discussion   . Sepsis ([place]) 07/16/2018  . Hyperglycemia 07/16/2018  . Benign essential HTN 07/16/2018  . AMS (altered mental status) 07/16/2018  . COPD with acute exacerbation ([place]) 07/16/2018  . CKD (chronic kidney disease) stage 3, GFR 30-59 ml/min (HCC) 07/16/2018  . Emphysema of lung ([place]) 07/16/2018  . NSVT (nonsustained ventricular tachycardia) ([place]) 07/16/2018    Palliative Care Assessment & Plan   Patient Profile:    Assessment:  Timothy Garcia a 84 y.o.malewho resides at [facility] and has hx recurrent aspiration, admitted 6/24 with hypoxic respiratory failure presumed due to aspiration. Required intubation in ED. 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  The patient remains intubated, PMT consult for ongoing goals of care discussions.   Recommendations/Plan:  Continue current mode of care  Discussed again with patient as well as his sister and daughters via video chat.  He indicated understanding situation but needing time to process.  Will follow up with patient tomorrow.     Goals of Care and Additional Recommendations:  Limitations on Scope of Treatment: Full Scope Treatment  Code Status:    Code Status Orders  (From admission, onward)         Start     Ordered   08/20/18 0153  Full code  Continuous     08/20/18 0154        Code Status  History    Date Active Date Inactive Code Status Order ID Comments User Context   07/16/2018 2113 07/21/2018 2110 Full Code [id number]  Elwyn Reach, MD  Inpatient   05/20/2018 2308 06/29/2018 1744 Full Code [id number]  Horald Chestnut Inpatient   Advance Care Planning Activity       Prognosis:   Unable to determine Guarded  Discharge Planning:  To Be Determined  Care plan was discussed with patient (intubated) and sister/daughters via phone  Thank you for allowing the Palliative Medicine Team to assist in the care of this patient.   Time In: 1100 Time out 1140 Total Time 40 Prolonged Time Billed  no       Greater than 50%  of this time was spent counseling and coordinating care related to the above assessment and plan.  Micheline Rough, MD [place] Team [phone]  Please contact Palliative Medicine Team phone at [phone] for questions and concerns.

## 2018-08-28 NOTE — Progress Notes (Signed)
PIV consult: Assessed B arms. Unable to find suitable vein for peripheral IV due to edema. Midline placed R upper arm.

## 2018-08-29 LAB — MAGNESIUM: Magnesium: 1.8 mg/dL (ref 1.7–2.4)

## 2018-08-29 LAB — BASIC METABOLIC PANEL WITH GFR
Anion gap: 9 (ref 5–15)
BUN: 24 mg/dL — ABNORMAL HIGH (ref 8–23)
CO2: 18 mmol/L — ABNORMAL LOW (ref 22–32)
Calcium: 7.8 mg/dL — ABNORMAL LOW (ref 8.9–10.3)
Chloride: 121 mmol/L — ABNORMAL HIGH (ref 98–111)
Creatinine, Ser: 1.34 mg/dL — ABNORMAL HIGH (ref 0.61–1.24)
GFR calc Af Amer: 57 mL/min — ABNORMAL LOW
GFR calc non Af Amer: 49 mL/min — ABNORMAL LOW
Glucose, Bld: 129 mg/dL — ABNORMAL HIGH (ref 70–99)
Potassium: 3.4 mmol/L — ABNORMAL LOW (ref 3.5–5.1)
Sodium: 148 mmol/L — ABNORMAL HIGH (ref 135–145)

## 2018-08-29 LAB — GLUCOSE, CAPILLARY
Glucose-Capillary: 205 mg/dL — ABNORMAL HIGH (ref 70–99)
Glucose-Capillary: 219 mg/dL — ABNORMAL HIGH (ref 70–99)
Glucose-Capillary: 222 mg/dL — ABNORMAL HIGH (ref 70–99)
Glucose-Capillary: 229 mg/dL — ABNORMAL HIGH (ref 70–99)
Glucose-Capillary: 259 mg/dL — ABNORMAL HIGH (ref 70–99)
Glucose-Capillary: 275 mg/dL — ABNORMAL HIGH (ref 70–99)
Glucose-Capillary: 94 mg/dL (ref 70–99)

## 2018-08-29 LAB — PHOSPHORUS: Phosphorus: 3 mg/dL (ref 2.5–4.6)

## 2018-08-29 NOTE — Progress Notes (Signed)
Daily Progress Note   Patient Name: Timothy Garcia       Date: 08/29/2018 DOB: 12/27/1934  Age: 84 y.o. MRN#: [id number] Attending Physician: Marshell Garfinkel, MD Primary Care Physician: Rogers Blocker, MD Admit Date: 08/21/2018  Reason for Consultation/Follow-up: Establishing goals of care  Subjective: Lying in bed with eyes open.   ETT in place.   I asked Timothy Garcia if it was good to see family yesterday (via elink) and he nodded yes.  He indicated again that he understands questions that are being asked regarding care plan moving forward.  I have been tried to talk with him again about options of consideration for tracheostomy versus consideration for one-way extubation.  He became more withdrawn and did not answer questions regarding this.  I let him know that this is not an urgent need to have an answer, but we are reaching a point where we need to figure out plan moving forward.  I talked with him about [name] needing to have better direction of overall goals and suggested it be something we work to figure out early next week.  He nodded yes to this.  Length of Stay: 9  Current Medications: Scheduled Meds:   sodium chloride   Intravenous Once   bethanechol  10 mg Per Tube TID   chlorhexidine gluconate (MEDLINE KIT)  15 mL Mouth Rinse BID   Chlorhexidine Gluconate Cloth  6 each Topical Daily   enoxaparin (LOVENOX) injection  70 mg Subcutaneous Q12H   feeding supplement (PRO-STAT SUGAR FREE 64)  60 mL Per Tube BID   fluconazole  400 mg Per Tube Daily   free water  300 mL Per Tube Q4H   insulin aspart  2-6 Units Subcutaneous Q4H   insulin detemir  8 Units Subcutaneous Q12H   ipratropium-albuterol  3 mL Nebulization Q6H   mouth rinse  15 mL Mouth Rinse 10 times per day    pantoprazole (PROTONIX) IV  40 mg Intravenous QHS   sodium chloride flush  10-40 mL Intracatheter Q12H   sodium chloride flush  3 mL Intravenous Once    Continuous Infusions:  sodium chloride Stopped (08/28/18 2219)   dextrose     dextrose 50 mL/hr at 08/29/18 1029   feeding supplement (VITAL 1.5 CAL) 1,000 mL (08/28/18 1214)   meropenem (MERREM) IV 1  g (08/29/18 0940)    PRN Meds: sodium chloride, albuterol, dextrose, fentaNYL (SUBLIMAZE) injection, fentaNYL (SUBLIMAZE) injection, labetalol, midazolam, sodium chloride flush, sodium chloride flush  Physical Exam         General: Alert, awake, in no acute distress. Remains interactive today and nods appropriately to simple questions  HEENT: ETT Heart: Irregular, tachycardic. No murmur appreciated. Lungs: Good air movement, coarse Abdomen: Soft, nontender, nondistended, positive bowel sounds.  Ext: + edema Skin: Warm and dry  Vital Signs: BP (!) 97/52 (BP Location: Left Arm)    Pulse 94    Temp (!) 97.4 F (36.3 C) (Axillary)    Resp (!) 28    Ht '5\' 7"'  (1.702 m)    Wt 79.3 kg    SpO2 97%    BMI 27.38 kg/m  SpO2: SpO2: 97 % O2 Device: O2 Device: Ventilator O2 Flow Rate: O2 Flow Rate (L/min): 15 L/min  Intake/output summary:   Intake/Output Summary (Last 24 hours) at 08/29/2018 1034 Last data filed at 08/29/2018 0800 Gross per 24 hour  Intake 3521.64 ml  Output 1325 ml  Net 2196.64 ml   LBM: Last BM Date: 08/29/18 Baseline Weight: Weight: 73.1 kg Most recent weight: Weight: 79.3 kg       Palliative Assessment/Data:    Flowsheet Rows     Most Recent Value  Intake Tab  Referral Department  Critical care  Unit at Time of Referral  ICU  Palliative Care Primary Diagnosis  Cancer  Palliative Care Type  New Palliative care  Reason for referral  Clarify Goals of Care  Date first seen by Palliative Care  08/20/18  Clinical Assessment  Palliative Performance Scale Score  20%  Pain Max last 24 hours  4  Pain Min  Last 24 hours  3  Dyspnea Max Last 24 Hours  4  Dyspnea Min Last 24 hours  3  Psychosocial & Spiritual Assessment  Palliative Care Outcomes      Patient Active Problem List   Diagnosis Date Noted   Fungemia 08/26/2018   Acute hypoxemic respiratory failure (HCC) 08/20/2018   Aspiration pneumonia of both lower lobes ([place])    Palliative care by specialist    Goals of care, counseling/discussion    Sepsis ([place]) 07/16/2018   Hyperglycemia 07/16/2018   Benign essential HTN 07/16/2018   AMS (altered mental status) 07/16/2018   COPD with acute exacerbation ([place]) 07/16/2018   CKD (chronic kidney disease) stage 3, GFR 30-59 ml/min (HCC) 07/16/2018   Emphysema of lung ([place]) 07/16/2018   NSVT (nonsustained ventricular tachycardia) (HCC) 07/16/2018    Palliative Care Assessment & Plan   Patient Profile:    Assessment:  Timothy Garcia a 84 y.o.malewho resides at [facility] and has hx recurrent aspiration, admitted 6/24 with hypoxic respiratory failure presumed due to aspiration. Required intubation in ED. 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  The patient remains intubated, PMT consult for ongoing goals of care discussions.   Recommendations/Plan:  Continue current mode of care  I spoke with Timothy Garcia alone today.  He was interactive with me again today, however, he became withdrawn when talking about options of tracheostomy versus one-way extubation.  Will follow up with patient tomorrow.  I set a goal with him of working to figure out plan moving forward early next week.  I also called and spoke with his sister,  [name], to see what questions family had regarding his care.  We discussed again about the severity of his situation and she reports family continues to ask if there are other options moving  forward.  I talked with her about how I wish that there were other treatments that would restore Timothy Garcia to his prior health, however with his continued decline over the last several months followed by now being dependent on ventilator support, the realistic options moving forward are for tracheostomy (which carries with it the burden of long-term facility placement and he would continue to have poor prognosis from other comorbid conditions) versus one-way extubation with likelihood he would not survive his hospitalization.   Goals of Care and Additional Recommendations:  Limitations on Scope of Treatment: Full Scope Treatment  Code Status:    Code Status Orders  (From admission, onward)         Start     Ordered   08/20/18 0153  Full code  Continuous     08/20/18 0154        Code Status History    Date Active Date Inactive Code Status Order ID Comments User Context   07/16/2018 2113 07/21/2018 2110 Full Code [id number]  Elwyn Reach, MD Inpatient   05/20/2018 2308 06/29/2018 1744 Full Code [id number]  Horald Chestnut Inpatient   Advance Care Planning Activity       Prognosis:   Unable to determine Guarded  Discharge Planning:  To Be Determined  Care plan was discussed with patient (intubated) and sister via phone  Thank you for allowing the Palliative Medicine Team to assist in the care of this patient.   Time In: 0830 Time out 0915 Total Time 45 Prolonged Time Billed  no       Greater than 50%  of this time was spent counseling and coordinating care related to the above assessment and plan.  Micheline Rough, MD [place] Team [phone]  Please contact Palliative Medicine Team phone at [phone] for questions and concerns.

## 2018-08-29 NOTE — Progress Notes (Signed)
NAME:  Timothy Garcia, MRN:  [id number], DOB:  06-25-1934, LOS: 9 ADMISSION DATE:  08/21/2018, CONSULTATION DATE:  08/20/18 REFERRING MD:  Betsey Holiday  CHIEF COMPLAINT:  SOB   Brief History   Timothy Garcia is a 84 y.o. male who resides at SNF and has hx recurrent aspiration, admitted 6/24 with hypoxic respiratory failure presumed due to aspiration.  Required intubation in ED.   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  Significant Hospital Events   6/25 > admit.  Intubated, culture sent, IV fluids started, antibiotics initiated  Consults:  None.  Procedures:  ETT 6/24 >  5/25 L picc->6/27  Significant Diagnostic Tests:  CXR 6/24 > bilateral opacities R > L with bibasilar atelectasis.  6/26 echo:  1. The left ventricle has normal systolic function with an ejection fraction of 60-65%. The cavity size was normal. Left ventricular diastolic Doppler parameters are consistent with impaired relaxation. No evidence of left ventricular regional wall  motion abnormalities.  2. The right ventricle has normal systolic function. The cavity was normal. There is no increase in right ventricular wall thickness.  3. Left-sided pleural effusion noted.  4. Mild calcification of the mitral valve leaflet. No evidence of mitral valve stenosis. No significant mitral regurgitation.  5. The aortic valve is tricuspid. Moderate calcification of the aortic valve. No stenosis of the aortic valve.  6. The aortic root is normal in size and structure.  7. Normal IVC size. No complete TR doppler jet so unable to estimate PA systolic pressure.  8. No definite vegetation identified but would need TEE to definitively rule out.  Micro Data:  Blood 6/24 > candida albicans Urine 6/25 > neg U. Strep 6/25 > negative U. Legionella 6/25 > negative SARS CoV2 6/24 >  negative. Blood 6/27: >> Negative 629 sputum>> Klebsiella pneumoniae>> sensitive to meropenem  Antimicrobials:  Vanc 6/24 > 6/26 Cefepime 6/24 >  6/26 unasyn 6/26-> 08/26/2018 Fluconazole 6/26-> Meropenem 08/26/2018>>   Interim history/subjective:  No events o/n. Pt denies pain.   Objective:  Blood pressure (!) 97/52, pulse 94, temperature (!) 97.4 F (36.3 C), temperature source Axillary, resp. rate (!) 28, height 5\' 7"  (1.702 m), weight 79.3 kg, SpO2 97 %.    Vent Mode: PRVC FiO2 (%):  [30 %] 30 % Set Rate:  [28 bmp] 28 bmp Vt Set:  [520 mL] 520 mL PEEP:  [8 cmH20] 8 cmH20 Pressure Support:  [12 cmH20] 12 cmH20 Plateau Pressure:  [16 cmH20-28 cmH20] 16 cmH20   Intake/Output Summary (Last 24 hours) at 08/29/2018 1008 Last data filed at 08/29/2018 0800 Gross per 24 hour  Intake 3521.64 ml  Output 1325 ml  Net 2196.64 ml   Filed Weights   08/27/18 0500 08/28/18 0500 08/29/18 0500  Weight: 76.4 kg 77.3 kg 79.3 kg    Examination: General: Elderly male who is responsive, NAD, tremulous HEENT: AT/[place]; EOMI; Tracheal tube is in place, tongue midline; MMM Neuro: Tremulous, weak, follows commands. Answers Y/N questions appropriately. MAE on request. CNII-XII grossly intact to confrontation CV: Sounds are irregular irregular PULM: Diminished throughout, clearer than yesterday GI: Nontender, positive bowel sounds tube feedings at 40 Extremities: warm/dry, 1+ edema  Skin: no rashes or lesions     Assessment & Plan:   Critically ill due to acute hypoxic respiratory failure requiring intubation - presumed due to aspiration especially in light of underlying  hx of recurrent aspiration. Failed spontaneous breathing trial just secondary to tachypnea I still suspect with multiple comorbidities, and advanced age he will need tracheostomy to be liberated from mechanical ventilatory support if at all. --Palliative care working with patient and family to make these decisions. Appreciate Dr.  Kirstie Mirza continued work immensely   --Currently fails all attempts at vent weaning. Discussed w/ patient this morning, who seems to understand. --Continue palliative care discussions with family   Aspiration pneumonia  Candidemia Antimicrobials and antifungals per ID  08/27/2018 chest x-ray reviewed support apparatus in proper position no significant change  Hx COPD.  Severe with FEV1 less than 30%.  No current evidence of exacerbation Plan Continue bronchodilators   Atrial fibrillation with episodes of RVR currently blood pressure within goal Plan Controlled ventricular rate Anticoagulation  Chronic HFpEF:  Stable at this point   Anemia acute on chronic: Recent Labs    08/27/18 0651 08/28/18 0530  HGB 7.4* 7.3*  ]  Transfuse per protocol  Hx DVTs / PE - s/p IVC filter and on lovenox. Plan Continue Lovenox   AKI: Suspect ATN Lab Results  Component Value Date   CREATININE 1.34 (H) 08/29/2018   CREATININE 1.35 (H) 08/28/2018   CREATININE 1.39 (H) 08/27/2018   Avoid nephrotoxins Monitor creatinine     Hypernatremia:  Recent Labs  Lab 08/27/18 0651 08/28/18 0530 08/29/18 0210  NA 155* 153* 148*    Sodium has been rising, no trending down with free water Start D5W on 08/26/2018 Continue free water 300 cc every 4 hours   C albicans fungemia PICC line for chronic TPN was removed on 08/22/2018 Continue antifungals and antibiotics per ID service.  Currently on Diflucan and meropenem. Diflucan to continue until 7/11; meropenem through 7/7   Hx DM. CBG (last 3)  Recent Labs    08/28/18 2358 08/29/18 0334 08/29/18 0826  GLUCAP 222* 94 219*    Episode of hypoglycemia Plan Sliding scale insulin protocol    Hx gastric adenocarcinoma - on chronic TPN.  Currently on tube feedings at 30 Currently off TPN due to fungemia from PICC line. Abdominal film on 08/26/2018 did not demonstrate any radiographic abnormality   RUE swelling:  Right upper  extremity ultrasound reveals findings consistent with acute superficial vein thrombosis involving the right cephalic vein. Remains on low molecular weight heparin every 12 hourly IV placed distal to thrombus   Failure to thrive. He has been followed by palliative care in the outpatient setting.  Review of their notes from 6/5 demonstrate ongoing weight loss, poor activity tolerance, also suggests fairly poor insight to his prognosis Plan Continued conversations between palliative care and POA Supportive care Pt interactive enough to participate with PT>   Best Practice:  Diet: tf currently at 30/hr advancing as tolerated Pain/Anxiety/Delirium protocol (if indicated): per protocol VAP protocol (if indicated): In place. DVT prophylaxis: SCD's / Lovenox. GI prophylaxis: PPI. Glucose control: SSI. Mobility: Bedrest. Code Status: Full.  Palliative care is involved Family Communication: No family available at this time Disposition: ICU. Ongoing palliative care discussions.  Palliative care spoke to sister is a POA on 08/25/2018 who had very little insight of the fact the patient was on full mechanical ventilatory support.  Ongoing discussions per palliative care.  08/27/2018 spoke at length with sister [name] concerning possible tracheostomy versus palliative care she is going to call his daughter in [place] for guidance.  She will continue to interact over the phone at this time. Per Dr. Domingo Cocking, will  have 3 way phone conversation with sister and pt 7/3 or so.   I have independently seen and examined the patient, reviewed data, and developed an assessment and plan. A total of 36 minutes were spent in critical care assessment and medical decision making. This critical care time does not reflect procedure time, or teaching time or supervisory time of PA/NP/Med student/Med Resident, etc but could involve care discussion time.  Bonna Gains, MD PhD 08/29/18 10:08 AM

## 2018-08-29 NOTE — Progress Notes (Signed)
CBG 259. Current order states to start insulin drip. Discussed with Dr. Maryjean Ka. Order given to d/c D5 and hold off on insulin drip

## 2018-08-30 DIAGNOSIS — J9600 Acute respiratory failure, unspecified whether with hypoxia or hypercapnia: Secondary | ICD-10-CM

## 2018-08-30 LAB — BASIC METABOLIC PANEL
Anion gap: 9 (ref 5–15)
BUN: 25 mg/dL — ABNORMAL HIGH (ref 8–23)
CO2: 19 mmol/L — ABNORMAL LOW (ref 22–32)
Calcium: 8 mg/dL — ABNORMAL LOW (ref 8.9–10.3)
Chloride: 119 mmol/L — ABNORMAL HIGH (ref 98–111)
Creatinine, Ser: 1.25 mg/dL — ABNORMAL HIGH (ref 0.61–1.24)
GFR calc Af Amer: >60 mL/min (ref >60)
GFR calc non Af Amer: 53 mL/min — ABNORMAL LOW (ref >60)
Glucose, Bld: 127 mg/dL — ABNORMAL HIGH (ref 70–99)
Potassium: 3 mmol/L — ABNORMAL LOW (ref 3.5–5.1)
Sodium: 147 mmol/L — ABNORMAL HIGH (ref 135–145)

## 2018-08-30 LAB — GLUCOSE, CAPILLARY
Glucose-Capillary: 110 mg/dL — ABNORMAL HIGH (ref 70–99)
Glucose-Capillary: 154 mg/dL — ABNORMAL HIGH (ref 70–99)
Glucose-Capillary: 160 mg/dL — ABNORMAL HIGH (ref 70–99)
Glucose-Capillary: 160 mg/dL — ABNORMAL HIGH (ref 70–99)
Glucose-Capillary: 168 mg/dL — ABNORMAL HIGH (ref 70–99)
Glucose-Capillary: 211 mg/dL — ABNORMAL HIGH (ref 70–99)

## 2018-08-30 LAB — PHOSPHORUS: Phosphorus: 3.5 mg/dL (ref 2.5–4.6)

## 2018-08-30 LAB — MAGNESIUM: Magnesium: 1.8 mg/dL (ref 1.7–2.4)

## 2018-08-30 MED ORDER — INSULIN ASPART 100 UNIT/ML ~~LOC~~ SOLN
8.0000 [IU] | Freq: Once | SUBCUTANEOUS | Status: AC
  Administered 2018-08-30: 8 [IU] via SUBCUTANEOUS

## 2018-08-30 MED ORDER — POTASSIUM CHLORIDE 20 MEQ/15ML (10%) PO SOLN
40.0000 meq | Freq: Four times a day (QID) | ORAL | Status: AC
  Administered 2018-08-30 (×2): 40 meq
  Filled 2018-08-30 (×2): qty 30

## 2018-08-30 MED ORDER — INSULIN DETEMIR 100 UNIT/ML ~~LOC~~ SOLN
20.0000 [IU] | Freq: Two times a day (BID) | SUBCUTANEOUS | Status: DC
  Administered 2018-08-30: 01:00:00 20 [IU] via SUBCUTANEOUS
  Filled 2018-08-30 (×3): qty 0.2

## 2018-08-30 MED ORDER — FUROSEMIDE 10 MG/ML IJ SOLN
40.0000 mg | Freq: Once | INTRAMUSCULAR | Status: AC
  Administered 2018-08-30: 11:00:00 40 mg via INTRAVENOUS
  Filled 2018-08-30: qty 4

## 2018-08-30 MED ORDER — INSULIN DETEMIR 100 UNIT/ML ~~LOC~~ SOLN
8.0000 [IU] | Freq: Two times a day (BID) | SUBCUTANEOUS | Status: DC
  Administered 2018-08-30 – 2018-09-01 (×4): 8 [IU] via SUBCUTANEOUS
  Filled 2018-08-30 (×8): qty 0.08

## 2018-08-30 MED ORDER — ONDANSETRON HCL 4 MG/2ML IJ SOLN
4.0000 mg | Freq: Four times a day (QID) | INTRAMUSCULAR | Status: DC | PRN
  Administered 2018-08-30: 4 mg via INTRAVENOUS
  Filled 2018-08-30: qty 2

## 2018-08-30 NOTE — Progress Notes (Signed)
Spoke to E-Link MD about patient's N/V. New orders received for zofran 4 mg IV PRN. QTc interval measured. Tube feed held. Will start dextrose 10% infusion.

## 2018-08-30 NOTE — Progress Notes (Signed)
eLink Physician-Brief Progress Note Patient Name: Timothy Garcia DOB: [birth year] MRN: [id number]   Date of Service  08/30/2018  HPI/Events of Note  N/V - QTc interval = 0.41.   eICU Interventions  Will order: 1. Zofran 4 mg IV Q 4 hours PRN N/V. 2. Monitor QTc interval Q 6 hours. Notify MD if if QTc interval > 500 milliseconds.     Intervention Category Major Interventions: Other:  [name] 08/30/2018, 9:01 PM

## 2018-08-30 NOTE — Progress Notes (Signed)
NAME:  Timothy Garcia, MRN:  [id number], DOB:  [birth year], LOS: 83 ADMISSION DATE:  08/01/2018, CONSULTATION DATE:  08/20/18 REFERRING MD:  Betsey Holiday  CHIEF COMPLAINT:  SOB   Brief History   Timothy Garcia is a 84 y.o. male who resides at SNF and has hx recurrent aspiration, admitted 6/24 with hypoxic respiratory failure with bibasal infiltrates presumed due to aspiration.  Required intubation in ED.   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  Significant Hospital Events   6/25 > admit.  Intubated, culture sent, IV fluids started, antibiotics initiated  Consults:  None.  Procedures:  ETT 6/24 >  5/25 L picc->6/27  Significant Diagnostic Tests:   6/26 echo:  nml LVSF, No definite vegetation identified but would need TEE to definitively rule out.  Micro Data:  Blood 6/24 > candida albicans Urine 6/25 > neg U. Strep 6/25 > negative U. Legionella 6/25 > negative SARS CoV2 6/24 > negative. Blood 6/27: >> Negative 6/29 sputum>> ESBL Klebsiella >> sensitive to meropenem  Antimicrobials:  Vanc 6/24 > 6/26 Cefepime 6/24 >  6/26 unasyn 6/26-> 08/26/2018 Fluconazole 6/26-> Meropenem 08/26/2018>>   Interim history/subjective:   Remains critically ill, intubated Just received fentanyl Afebrile    Objective:  Blood pressure 103/85, pulse 94, temperature (!) 97.1 F (36.2 C), temperature source Axillary, resp. rate (!) 28, height 5\' 7"  (1.702 m), weight 79.8 kg, SpO2 99 %.    Vent Mode: PRVC FiO2 (%):  [30 %] 30 % Set Rate:  [28 bmp] 28 bmp Vt Set:  [520 mL] 520 mL PEEP:  [8 cmH20] 8 cmH20 Pressure Support:  [12 cmH20] 12 cmH20 Plateau Pressure:  [19 cmH20-29 cmH20] 29 cmH20   Intake/Output Summary (Last 24 hours) at 08/30/2018 0858 Last data filed at 08/30/2018 0800 Gross per 24 hour  Intake 2495.49 ml  Output 2425 ml  Net  70.49 ml   Filed Weights   08/28/18 0500 08/29/18 0500 08/30/18 0500  Weight: 77.3 kg 79.3 kg 79.8 kg    Examination: General: Elderly male , chronically ill-appearing, NAD,  HEENT: AT/[place]; EOMI; orally intubated, tongue midline; moist mucosa Neuro: Tremulous, weak, follows commands.  Currently just received fentanyl and does not CV: Irregular S1-S2 PULM: Diminished bilateral GI: Nontender, positive bowel sounds tube feedings at 40 Extremities: warm/dry, 1+ edema  Skin: no rashes or lesions  Chest x-ray 7/3 personally reviewed which shows left retrocardiac consolidation and bibasal effusions   Assessment & Plan:   Critically ill due to acute hypoxic respiratory failure requiring intubation - presumed due to aspiration especially in light of underlying hx of recurrent aspiration. -Has failed weaning attempts so far -Found respiratory rate set at 28, will decrease to 20 to decrease auto PEEP -Reassess spontaneous breathing trial, tolerates pressure support 15/5 --Continue palliative care discussions with family   Aspiration pneumonia  Candida albicans fungemia, PICC and TPN discontinued  Meropenem until 7/7 Fluconazole until 7/11   Hx COPD.  Severe with FEV1 less than 30%.  No current evidence of exacerbation Plan Continue bronchodilators No need for steroids   Atrial fibrillation with episodes of RVR currently blood pressure within goal Plan Controlled ventricular rate Treatment dose Lovenox  Chronic HFpEF:  Stable at this point   Anemia acute on chronic: Transfuse for hb 7 or lower  Hx DVTs / PE - s/p IVC filter and on lovenox. RUE swelling:  Right upper extremity ultrasound reveals findings consistent with acute superficial vein thrombosis involving the right cephalic vein. Plan Remains on low molecular weight heparin every 12 hourly   AKI: Suspect ATN, creatinine stable Hypernatremia:   Sodium has decreased from 1 55-1 47, DC D5W since sugars  rising Continue free water 300 cc every 4 hours Restart Lasix 40 daily for negative balance for anasarca    Hx DM. Episode of hypoglycemia 7/3 Plan Sliding scale insulin protocol Dc D5W   Protein calorie malnutrition Hx gastric adenocarcinoma - on chronic TPN.  Currently on tube feedings at goal Currently off TPN due to fungemia from PICC line.    Failure to thrive. He was followed by palliative care in the outpatient setting.  Review of their notes demonstrate ongoing weight loss, poor activity tolerance, also suggests fairly poor insight to his prognosis Plan Ongoing conversations between palliative care and POA/ pt He does not seem to be a good candidate for tracheostomy.  Would support goal of one-way extubation.  I will try to define over next 24 to 48 hours without our chances of success    Best Practice:  Diet: tf currently at 30/hr advancing as tolerated Pain/Anxiety/Delirium protocol (if indicated): per protocol VAP protocol (if indicated): In place. DVT prophylaxis: SCD's / Lovenox. GI prophylaxis: PPI. Glucose control: SSI. Mobility: Bedrest. Code Status: Full.  Palliative care is involved Family Communication: No family available at this time Disposition: ICU.  08/27/2018 spoke at length with sister [name] concerning possible tracheostomy versus palliative care she is going to call his daughter in [place] for guidance.   The patient is critically ill with multiple organ systems failure and requires high complexity decision making for assessment and support, frequent evaluation and titration of therapies, application of advanced monitoring technologies and extensive interpretation of multiple databases. Critical Care Time devoted to patient care services described in this note independent of APP/resident  time is 32 minutes.   Kara Mead MD. Shade Flood. [facility] Pager [phone] If no response call [phone]     08/30/18 8:58  AM

## 2018-08-30 NOTE — Progress Notes (Signed)
eLink Physician-Brief Progress Note Patient Name: Timothy Garcia DOB: July 31, 1934 MRN: [id number]   Date of Service  08/30/2018  HPI/Events of Note  A 84 year old male with a history of insulin-dependent diabetes currently developed hyperglycemia with a glucose of 275, on tube feed.   eICU Interventions  Increase the Levemir to 20 units twice daily starting now as patient takes 35 units of long-acting daily.  Ordered one-time extra dose of 8 units NovoLog and then follow the insulin sliding scale.      Intervention Category Intermediate Interventions: Hyperglycemia - evaluation and treatment;Medication change / dose adjustment  Ephriam Jenkins Morrison Masser 08/30/2018, 12:09 AM

## 2018-08-31 ENCOUNTER — Inpatient Hospital Stay (HOSPITAL_COMMUNITY): Payer: Medicare Other

## 2018-08-31 DIAGNOSIS — K567 Ileus, unspecified: Secondary | ICD-10-CM

## 2018-08-31 LAB — BASIC METABOLIC PANEL
Anion gap: 9 (ref 5–15)
BUN: 23 mg/dL (ref 8–23)
CO2: 22 mmol/L (ref 22–32)
Calcium: 8.2 mg/dL — ABNORMAL LOW (ref 8.9–10.3)
Chloride: 119 mmol/L — ABNORMAL HIGH (ref 98–111)
Creatinine, Ser: 1.16 mg/dL (ref 0.61–1.24)
GFR calc Af Amer: >60 mL/min (ref >60)
GFR calc non Af Amer: 58 mL/min — ABNORMAL LOW (ref >60)
Glucose, Bld: 117 mg/dL — ABNORMAL HIGH (ref 70–99)
Potassium: 3.5 mmol/L (ref 3.5–5.1)
Sodium: 150 mmol/L — ABNORMAL HIGH (ref 135–145)

## 2018-08-31 LAB — CBC WITH DIFFERENTIAL/PLATELET
Abs Immature Granulocytes: 0.04 10*3/uL (ref 0.00–0.07)
Basophils Absolute: 0 10*3/uL (ref 0.0–0.1)
Basophils Relative: 0 %
Eosinophils Absolute: 0.2 10*3/uL (ref 0.0–0.5)
Eosinophils Relative: 3 %
HCT: 23.4 % — ABNORMAL LOW (ref 39.0–52.0)
Hemoglobin: 7.1 g/dL — ABNORMAL LOW (ref 13.0–17.0)
Immature Granulocytes: 1 %
Lymphocytes Relative: 11 %
Lymphs Abs: 0.9 10*3/uL (ref 0.7–4.0)
MCH: 25.5 pg — ABNORMAL LOW (ref 26.0–34.0)
MCHC: 30.3 g/dL (ref 30.0–36.0)
MCV: 84.2 fL (ref 80.0–100.0)
Monocytes Absolute: 0.5 10*3/uL (ref 0.1–1.0)
Monocytes Relative: 6 %
Neutro Abs: 6.4 10*3/uL (ref 1.7–7.7)
Neutrophils Relative %: 79 %
Platelets: 312 10*3/uL (ref 150–400)
RBC: 2.78 MIL/uL — ABNORMAL LOW (ref 4.22–5.81)
RDW: 20.5 % — ABNORMAL HIGH (ref 11.5–15.5)
WBC: 8 10*3/uL (ref 4.0–10.5)
nRBC: 0.2 % (ref 0.0–0.2)

## 2018-08-31 LAB — MISC LABCORP TEST (SEND OUT): Labcorp test code: 182220

## 2018-08-31 LAB — PREALBUMIN: Prealbumin: 7.2 mg/dL — ABNORMAL LOW (ref 18–38)

## 2018-08-31 LAB — TRIGLYCERIDES: Triglycerides: 187 mg/dL — ABNORMAL HIGH (ref <150)

## 2018-08-31 LAB — GLUCOSE, CAPILLARY
Glucose-Capillary: 116 mg/dL — ABNORMAL HIGH (ref 70–99)
Glucose-Capillary: 129 mg/dL — ABNORMAL HIGH (ref 70–99)
Glucose-Capillary: 156 mg/dL — ABNORMAL HIGH (ref 70–99)
Glucose-Capillary: 160 mg/dL — ABNORMAL HIGH (ref 70–99)
Glucose-Capillary: 170 mg/dL — ABNORMAL HIGH (ref 70–99)
Glucose-Capillary: 172 mg/dL — ABNORMAL HIGH (ref 70–99)

## 2018-08-31 LAB — PHOSPHORUS: Phosphorus: 3.8 mg/dL (ref 2.5–4.6)

## 2018-08-31 LAB — MAGNESIUM: Magnesium: 1.8 mg/dL (ref 1.7–2.4)

## 2018-08-31 MED ORDER — POTASSIUM CHLORIDE 20 MEQ/15ML (10%) PO SOLN
40.0000 meq | Freq: Once | ORAL | Status: AC
  Administered 2018-08-31: 40 meq
  Filled 2018-08-31: qty 30

## 2018-08-31 MED ORDER — FUROSEMIDE 10 MG/ML IJ SOLN
40.0000 mg | Freq: Once | INTRAMUSCULAR | Status: AC
  Administered 2018-08-31: 40 mg via INTRAVENOUS
  Filled 2018-08-31: qty 4

## 2018-08-31 NOTE — Progress Notes (Addendum)
Daily Progress Note   Patient Name: Timothy Garcia       Date: 08/31/2018 DOB: 09/20/1934  Age: 84 y.o. MRN#: [id number] Attending Physician: Timothy Garfinkel, MD Primary Care Physician: Timothy Blocker, MD Admit Date: 08/13/2018  Reason for Consultation/Follow-up: Establishing goals of care  Subjective: Lying in bed.   ETT in place.   I tried to talk with him again about options of consideration for tracheostomy versus consideration for one-way extubation.  He was sleepier today following fentanyl and not able to have conversation today.  Length of Stay: 11  Current Medications: Scheduled Meds:  . sodium chloride   Intravenous Once  . bethanechol  10 mg Per Tube TID  . chlorhexidine gluconate (MEDLINE KIT)  15 mL Mouth Rinse BID  . Chlorhexidine Gluconate Cloth  6 each Topical Daily  . enoxaparin (LOVENOX) injection  70 mg Subcutaneous Q12H  . feeding supplement (PRO-STAT SUGAR FREE 64)  60 mL Per Tube BID  . fluconazole  400 mg Per Tube Daily  . free water  300 mL Per Tube Q4H  . insulin aspart  2-6 Units Subcutaneous Q4H  . insulin detemir  8 Units Subcutaneous Q12H  . ipratropium-albuterol  3 mL Nebulization Q6H  . mouth rinse  15 mL Mouth Rinse 10 times per day  . pantoprazole (PROTONIX) IV  40 mg Intravenous QHS  . sodium chloride flush  10-40 mL Intracatheter Q12H  . sodium chloride flush  3 mL Intravenous Once    Continuous Infusions: . sodium chloride Stopped (08/30/18 2231)  . dextrose 40 mL/hr at 08/31/18 0000  . feeding supplement (VITAL 1.5 CAL) Stopped (08/30/18 2035)  . meropenem (MERREM) IV Stopped (08/30/18 2227)    PRN Meds: sodium chloride, albuterol, dextrose, fentaNYL (SUBLIMAZE) injection, fentaNYL (SUBLIMAZE) injection, labetalol, midazolam, ondansetron  (ZOFRAN) IV, sodium chloride flush, sodium chloride flush  Physical Exam         General: Sleepy today HEENT: ETT Heart: Irregular, tachycardic. No murmur appreciated. Lungs: Good air movement, coarse Abdomen: Soft, nontender, nondistended, positive bowel sounds.  Ext: + edema Skin: Warm and dry  Vital Signs: BP 100/60   Pulse 99   Temp 97.9 F (36.6 C) (Axillary)   Resp (!) 22   Ht '5\' 7"'  (1.702 m)   Wt 79.8 kg   SpO2 100%  BMI 27.55 kg/m  SpO2: SpO2: 100 % O2 Device: O2 Device: Ventilator O2 Flow Rate: O2 Flow Rate (L/min): 15 L/min  Intake/output summary:   Intake/Output Summary (Last 24 hours) at 08/31/2018 0008 Last data filed at 08/31/2018 0000 Gross per 24 hour  Intake 2902.61 ml  Output 4405 ml  Net -1502.39 ml   LBM: Last BM Date: 08/30/18 Baseline Weight: Weight: 73.1 kg Most recent weight: Weight: 79.8 kg       Palliative Assessment/Data:    Flowsheet Rows     Most Recent Value  Intake Tab  Referral Department  Critical care  Unit at Time of Referral  ICU  Palliative Care Primary Diagnosis  Cancer  Palliative Care Type  New Palliative care  Reason for referral  Clarify Goals of Care  Date first seen by Palliative Care  08/20/18  Clinical Assessment  Palliative Performance Scale Score  20%  Pain Max last 24 hours  4  Pain Min Last 24 hours  3  Dyspnea Max Last 24 Hours  4  Dyspnea Min Last 24 hours  3  Psychosocial & Spiritual Assessment  Palliative Care Outcomes      Patient Active Problem List   Diagnosis Date Noted  . Fungemia 08/26/2018  . Acute hypoxemic respiratory failure ([place]) 08/20/2018  . Aspiration pneumonia of both lower lobes ([place])   . Palliative care by specialist   . Goals of care, counseling/discussion   . Sepsis ([place]) 07/16/2018  . Hyperglycemia 07/16/2018  . Benign essential HTN 07/16/2018  . AMS (altered mental status) 07/16/2018  . COPD with acute exacerbation ([place]) 07/16/2018  . CKD (chronic kidney disease) stage  3, GFR 30-59 ml/min ([facility]) 07/16/2018  . Emphysema of lung ([place]) 07/16/2018  . NSVT (nonsustained ventricular tachycardia) ([place]) 07/16/2018    Palliative Care Assessment & Plan   Patient Profile:    Assessment:  Timothy Garcia a 84 y.o.malewho resides at [facility] and has hx recurrent aspiration, admitted 6/24 with hypoxic respiratory failure presumed due to aspiration. Required intubation in ED. 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  The patient remains intubated, PMT consult for ongoing goals of care discussions.   Recommendations/Plan:  Continue current mode of care  I spoke with Timothy Garcia alone today.  He was sleepier today and I was unable to accomplish much with conversation.  Will follow up with patient tomorrow.  I set a goal with him of working to figure out plan moving forward early next week.   Goals of Care and Additional Recommendations:  Limitations on Scope of Treatment: Full Scope Treatment  Code Status:    Code Status Orders  (From admission, onward)         Start     Ordered   08/20/18 0153  Full code  Continuous     08/20/18 0154        Code Status History    Date Active Date Inactive Code Status Order ID Comments User Context   07/16/2018 2113 07/21/2018 2110 Full Code [id number]  Elwyn Reach, MD Inpatient   05/20/2018 2308 06/29/2018 1744 Full Code [id number]  Horald Chestnut Inpatient   Advance Care Planning Activity       Prognosis:   Unable to determine Guarded  Discharge Planning:  To Be Determined  Care plan was discussed with patient (intubated) and sister via phone  Thank  you for allowing the Palliative Medicine Team to assist in the care of this patient.   Time In: 1100 Time out 1115 Total Time 45 Prolonged Time Billed  no       Greater than 50%  of this  time was spent counseling and coordinating care related to the above assessment and plan.  Micheline Rough, MD [place] Team [phone]  Please contact Palliative Medicine Team phone at [phone] for questions and concerns.

## 2018-08-31 NOTE — Progress Notes (Signed)
RT called for second time (0160 and 0152) to come assess patient and alarming vent for pressure regulation. This RN has suctioned, and given meds to help with patient's pain and restlessness. Alarm continues to sound. Current RR 20, current O2 saturation is 98. Unlabored breathing. I have also spoken with E-Link RN on any suggestions she might have. Will continue to monitor.

## 2018-08-31 NOTE — Progress Notes (Signed)
NAME:  Timothy Garcia, MRN:  [id number], DOB:  January 19, 1935, LOS: 76 ADMISSION DATE:  08/05/2018, CONSULTATION DATE:  08/20/18 REFERRING MD:  Betsey Holiday  CHIEF COMPLAINT:  SOB   Brief History   Timothy Garcia is a 84 y.o. male who resides at SNF and has hx recurrent aspiration, admitted 6/24 with hypoxic respiratory failure with bibasal infiltrates presumed due to aspiration.  Required intubation in ED.   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  Significant Hospital Events   6/25 > admit.  Intubated, culture sent, IV fluids started, antibiotics initiated  Consults:  None.  Procedures:  ETT 6/24 >  5/25 L picc->6/27  Significant Diagnostic Tests:   6/26 echo:  nml LVSF, No definite vegetation identified but would need TEE to definitively rule out.  Micro Data:  Blood 6/24 > candida albicans Urine 6/25 > neg U. Strep 6/25 > negative U. Legionella 6/25 > negative SARS CoV2 6/24 > negative. Blood 6/27: >> Negative 6/29 sputum>> ESBL Klebsiella >> sensitive to meropenem  Antimicrobials:  Vanc 6/24 > 6/26 Cefepime 6/24 >  6/26 unasyn 6/26-> 08/26/2018 Fluconazole 6/26-> Meropenem 08/26/2018>>   Interim history/subjective:   Remains critically ill, intubated Episode of vomiting and tube feeds held Blood pressure appears soft Good urine output with Lasix    Objective:  Blood pressure (!) 98/58, pulse 93, temperature 97.6 F (36.4 C), temperature source Oral, resp. rate 20, height 5\' 7"  (1.702 m), weight 77.2 kg, SpO2 96 %.    Vent Mode: PRVC FiO2 (%):  [30 %] 30 % Set Rate:  [20 bmp] 20 bmp Vt Set:  [520 mL] 520 mL PEEP:  [5 cmH20] 5 cmH20 Plateau Pressure:  [19 cmH20-28 cmH20] 19 cmH20   Intake/Output Summary (Last 24 hours) at 08/31/2018 1125 Last data filed at 08/31/2018 0900 Gross per 24 hour  Intake 2714.83 ml  Output  4575 ml  Net -1860.17 ml   Filed Weights   08/29/18 0500 08/30/18 0500 08/31/18 0500  Weight: 79.3 kg 79.8 kg 77.2 kg    Examination: General: Elderly male , chronically ill-appearing, NAD,  HEENT: AT/[place]; EOMI; orally intubated, tongue midline; moist mucosa Neuro: Tremulous, weak, follows commands.  CV: Irregular S1-S2 PULM: Diminished bilateral GI: Nontender, distended, positive bowel sounds, Cecile has liquid stool Extremities: warm/dry, 1+ edema  Skin: no rashes or lesions  Chest x-ray 7/6 personally reviewed which shows right more than left interstitial/alveolar infiltrates   Assessment & Plan:   Critically ill due to acute hypoxic respiratory failure requiring intubation - presumed due to aspiration especially in light of underlying hx of recurrent aspiration. -Continues to fail weaning attempts -Reassess spontaneous breathing trial --Continue palliative care discussions with family, will need tracheostomy if they want [name] to push forward   Aspiration pneumonia  Candida albicans fungemia, PICC and TPN discontinued  Meropenem until 7/7 Fluconazole until 7/11   Hx COPD.  Severe with FEV1 less than 30%.  No current evidence of exacerbation Plan Continue bronchodilators No need for steroids   Atrial fibrillation with episodes of RVR currently blood pressure within goal Plan Controlled ventricular rate Treatment dose Lovenox  Chronic HFpEF:  Stable at this point  AKI: Suspect ATN, creatinine stable Hypernatremia:   Sodium has decreased from 1 55-1 47 but rising again to 150 Continue free water 300 cc every 4 hours ct Lasix 40 daily for negative balance for anasarca  Protein calorie malnutrition Hx gastric adenocarcinoma - on chronic TPN.  Currently  tube feedings held, XR abdomen then resume at trickle Currently off TPN due to fungemia from PICC line.  Anemia acute on chronic: Transfuse for hb 7 or lower  Hx DVTs / PE - s/p IVC filter and on lovenox.  RUE swelling:  Right upper extremity ultrasound reveals findings consistent with acute superficial vein thrombosis involving the right cephalic vein. Plan Remains on low molecular weight heparin every 12 hourly   Hx DM. Episode of hypoglycemia 7/3 Plan Sliding scale insulin protocol  on D10 since TFs held   Failure to thrive. He was followed by palliative care in the outpatient setting.  Review of their notes demonstrate ongoing weight loss, poor activity tolerance, also suggests fairly poor insight to his prognosis Plan Ongoing conversations between palliative care and sister, patient is unable to clearly indicate 1 way or the other about tracheostomy He does not seem to be a good candidate for tracheostomy.  Would support goal of one-way extubation.  I will try to define over next 24 to 48 hours without our chances of success    Best Practice:  Diet: tf currently at 30/hr advancing as tolerated Pain/Anxiety/Delirium protocol (if indicated): per protocol VAP protocol (if indicated): In place. DVT prophylaxis: SCD's / Lovenox. GI prophylaxis: PPI. Glucose control: SSI. Mobility: Bedrest. Code Status: Full.  Palliative care is involved Family Communication: No family available at this time Disposition: ICU.  08/27/2018 spoke at length with sister [name] concerning possible tracheostomy versus palliative care she is going to call his daughter in [place] for guidance.   The patient is critically ill with multiple organ systems failure and requires high complexity decision making for assessment and support, frequent evaluation and titration of therapies, application of advanced monitoring technologies and extensive interpretation of multiple databases. Critical Care Time devoted to patient care services described in this note independent of APP/resident  time is 32 minutes.    Kara Mead MD. Shade Flood. [facility] Pager [phone] If no response call [phone]      08/31/18 11:25 AM

## 2018-08-31 NOTE — Progress Notes (Signed)
Daily Progress Note   Patient Name: Timothy Garcia       Date: 08/31/2018 DOB: March 16, 1934  Age: 84 y.o. MRN#: [id number] Attending Physician: Rigoberto Noel, MD Primary Care Physician: Rogers Blocker, MD Admit Date: 08/12/2018  Reason for Consultation/Follow-up: Establishing goals of care  Subjective: Lying in bed.   ETT in place.   I tried to talk with him again about options of consideration for tracheostomy versus consideration for one-way extubation.  He is either less understanding of conversation than previously believed or he is withdrawing and not participating in conversation any further.    In either case, I believe we are going to need to rely on surrogate decision maker, sister Timothy Garcia, who has been in contact with some of his children (reportedly estranged) to assist in final decision regarding trach vs one way extubation.  Length of Stay: 11  Current Medications: Scheduled Meds:   sodium chloride   Intravenous Once   bethanechol  10 mg Per Tube TID   chlorhexidine gluconate (MEDLINE KIT)  15 mL Mouth Rinse BID   Chlorhexidine Gluconate Cloth  6 each Topical Daily   enoxaparin (LOVENOX) injection  70 mg Subcutaneous Q12H   feeding supplement (PRO-STAT SUGAR FREE 64)  60 mL Per Tube BID   fluconazole  400 mg Per Tube Daily   free water  300 mL Per Tube Q4H   insulin aspart  2-6 Units Subcutaneous Q4H   insulin detemir  8 Units Subcutaneous Q12H   ipratropium-albuterol  3 mL Nebulization Q6H   mouth rinse  15 mL Mouth Rinse 10 times per day   pantoprazole (PROTONIX) IV  40 mg Intravenous QHS   sodium chloride flush  10-40 mL Intracatheter Q12H   sodium chloride flush  3 mL Intravenous Once    Continuous Infusions:  sodium chloride Stopped (08/30/18 2231)    dextrose 40 mL/hr at 08/31/18 0900   feeding supplement (VITAL 1.5 CAL) Stopped (08/30/18 2035)   meropenem (MERREM) IV 1 g (08/31/18 1020)    PRN Meds: sodium chloride, albuterol, dextrose, fentaNYL (SUBLIMAZE) injection, fentaNYL (SUBLIMAZE) injection, labetalol, midazolam, ondansetron (ZOFRAN) IV, sodium chloride flush, sodium chloride flush  Physical Exam         General: Sleepy today HEENT: ETT Heart: Irregular, tachycardic. No murmur appreciated. Lungs: Good air movement, coarse Abdomen:  Soft, nontender, nondistended, positive bowel sounds.  Ext: + edema Skin: Warm and dry  Vital Signs: BP (!) 98/58    Pulse 93    Temp 97.6 F (36.4 C) (Oral)    Resp 20    Ht '5\' 7"'  (1.702 m)    Wt 77.2 kg    SpO2 96%    BMI 26.66 kg/m  SpO2: SpO2: 96 % O2 Device: O2 Device: Ventilator O2 Flow Rate: O2 Flow Rate (L/min): 15 L/min  Intake/output summary:   Intake/Output Summary (Last 24 hours) at 08/31/2018 1122 Last data filed at 08/31/2018 0900 Gross per 24 hour  Intake 2714.83 ml  Output 4575 ml  Net -1860.17 ml   LBM: Last BM Date: 08/31/18 Baseline Weight: Weight: 73.1 kg Most recent weight: Weight: 77.2 kg       Palliative Assessment/Data:    Flowsheet Rows     Most Recent Value  Intake Tab  Referral Department  Critical care  Unit at Time of Referral  ICU  Palliative Care Primary Diagnosis  Cancer  Palliative Care Type  New Palliative care  Reason for referral  Clarify Goals of Care  Date first seen by Palliative Care  08/20/18  Clinical Assessment  Palliative Performance Scale Score  20%  Pain Max last 24 hours  4  Pain Min Last 24 hours  3  Dyspnea Max Last 24 Hours  4  Dyspnea Min Last 24 hours  3  Psychosocial & Spiritual Assessment  Palliative Care Outcomes      Patient Active Problem List   Diagnosis Date Noted   Fungemia 08/26/2018   Acute hypoxemic respiratory failure (HCC) 08/20/2018   Aspiration pneumonia of both lower lobes ([place])     Palliative care by specialist    Goals of care, counseling/discussion    Sepsis ([place]) 07/16/2018   Hyperglycemia 07/16/2018   Benign essential HTN 07/16/2018   AMS (altered mental status) 07/16/2018   COPD with acute exacerbation ([place]) 07/16/2018   CKD (chronic kidney disease) stage 3, GFR 30-59 ml/min (HCC) 07/16/2018   Emphysema of lung ([place]) 07/16/2018   NSVT (nonsustained ventricular tachycardia) (HCC) 07/16/2018    Palliative Care Assessment & Plan   Patient Profile:    Assessment:  Timothy Smithis a 84 y.o.malewho resides at [facility] and has hx recurrent aspiration, admitted 6/24 with hypoxic respiratory failure presumed due to aspiration. Required intubation in ED. 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  The patient remains intubated, PMT consult for ongoing goals of care discussions.   Recommendations/Plan:  Continue current mode of care  I spoke with Timothy Garcia alone today.  He is not consistently engaging in conversation again today.  Discussed with Dr. Elsworth Soho.  He is planning to reach out to sister today to discuss again regarding trach and that this will mean that Timothy Garcia.  -I called and was able to reach patient's sister, Timothy Garcia.  She reports being able to speak with Dr. Elsworth Soho earlier today and that she needs to speak further with the rest of her family prior to making any decisions about care plan moving forward.  We reviewed again his clinical course and pathways forward with regard to potential for tracheostomy.  I answered her questions regarding tracheostomy Garcia and likely quality of life issues following.  This included discussion that he will realistically spend  the rest of his life in a long-term vent capable facility if family determines he  would want to pursue tracheostomy.  She expressed appreciation for time spent by both myself and Dr. Elsworth Soho to help her understand his situation.  She will reach out to family this evening and is expecting to hear from care team again tomorrow regarding decision.    Goals of Care and Additional Recommendations:  Limitations on Scope of Treatment: Full Scope Treatment  Code Status:    Code Status Orders  (From admission, onward)         Start     Ordered   08/20/18 0153  Full code  Continuous     08/20/18 0154        Code Status History    Date Active Date Inactive Code Status Order ID Comments User Context   07/16/2018 2113 07/21/2018 2110 Full Code [id number]  Elwyn Reach, MD Inpatient   05/20/2018 2308 06/29/2018 1744 Full Code [id number]  Horald Chestnut Inpatient   Advance Care Planning Activity       Prognosis:   Unable to determine Guarded  Discharge Planning:  To Be Determined  Care plan was discussed with patient (intubated) and sister via phone  Thank you for allowing the Palliative Medicine Team to assist in the care of this patient.   Time: 1020-1035 1420-1445 Total Time 40 Prolonged Time Billed  no       Greater than 50%  of this time was spent counseling and coordinating care related to the above assessment and plan.  Micheline Rough, MD [place] Team [phone]  Please contact Palliative Medicine Team phone at [phone] for questions and concerns.

## 2018-09-01 ENCOUNTER — Encounter (HOSPITAL_COMMUNITY): Payer: Self-pay

## 2018-09-01 ENCOUNTER — Inpatient Hospital Stay (HOSPITAL_COMMUNITY): Payer: Medicare Other

## 2018-09-01 DIAGNOSIS — R109 Unspecified abdominal pain: Secondary | ICD-10-CM

## 2018-09-01 LAB — BASIC METABOLIC PANEL
Anion gap: 9 (ref 5–15)
BUN: 22 mg/dL (ref 8–23)
CO2: 23 mmol/L (ref 22–32)
Calcium: 8.3 mg/dL — ABNORMAL LOW (ref 8.9–10.3)
Chloride: 118 mmol/L — ABNORMAL HIGH (ref 98–111)
Creatinine, Ser: 1.32 mg/dL — ABNORMAL HIGH (ref 0.61–1.24)
GFR calc Af Amer: 58 mL/min — ABNORMAL LOW (ref >60)
GFR calc non Af Amer: 50 mL/min — ABNORMAL LOW (ref >60)
Glucose, Bld: 154 mg/dL — ABNORMAL HIGH (ref 70–99)
Potassium: 3.8 mmol/L (ref 3.5–5.1)
Sodium: 150 mmol/L — ABNORMAL HIGH (ref 135–145)

## 2018-09-01 LAB — GLUCOSE, CAPILLARY
Glucose-Capillary: 130 mg/dL — ABNORMAL HIGH (ref 70–99)
Glucose-Capillary: 136 mg/dL — ABNORMAL HIGH (ref 70–99)
Glucose-Capillary: 144 mg/dL — ABNORMAL HIGH (ref 70–99)
Glucose-Capillary: 72 mg/dL (ref 70–99)
Glucose-Capillary: 104 mg/dL — ABNORMAL HIGH (ref 70–99)

## 2018-09-01 LAB — MAGNESIUM: Magnesium: 1.8 mg/dL (ref 1.7–2.4)

## 2018-09-01 MED ORDER — POTASSIUM CHLORIDE 10 MEQ/100ML IV SOLN
10.0000 meq | Freq: Once | INTRAVENOUS | Status: AC
  Administered 2018-09-01: 11:00:00 10 meq via INTRAVENOUS

## 2018-09-01 MED ORDER — FUROSEMIDE 10 MG/ML IJ SOLN
40.0000 mg | Freq: Once | INTRAMUSCULAR | Status: AC
  Administered 2018-09-01: 40 mg via INTRAVENOUS
  Filled 2018-09-01: qty 4

## 2018-09-01 NOTE — Progress Notes (Signed)
Nutrition Follow-up  DOCUMENTATION CODES:   Not applicable  INTERVENTION:   - TPN line holiday  - Vital 1.5 ml/hr @ 20 ml/hr (trickle) with 60 ml Prostat BID. Provides: 1120 kcal, 92 grams protein  - Recommend increase Vital 1.5 to goal rate of 40 ml/hr (960 ml/day) via OG tube as able   Provides 1840 kcal, 124 grams of protein, and 733 ml free water.  - Consider cortrak tube if vomiting continues and aligns with his goals of care.   NUTRITION DIAGNOSIS:   Increased nutrient needs related to chronic illness (aspiration PNA, cancer) as evidenced by estimated needs.  Ongoing, being addressed via TF  GOAL:   Patient will meet greater than or equal to 90% of their needs  Progressing  MONITOR:   Labs  REASON FOR ASSESSMENT:   Consult, Ventilator Assessment of nutrition requirement/status, Enteral/tube feeding initiation and management (high blood sugars)  ASSESSMENT:   Pt with PMH of COPD, DM, stomach cancer s/p distal gastrectomy with Roux-en-y on 3/5, unable to place J-tube due to narrow caliber of jejunum therefore no enteral access on chronic TPN, recurrent aspiration from SNF now admitted with hypoxic respiratory failure presumed due to aspiration.  6/26 - trickle TF initiated 6/27 - PICC removed 7/3 - TF reached goal rate 7/5 - pt having N/V and emesis. TF held and started on zofran.  7/7 - trickle TF started  RD working remotely.  Palliative care team following pt for discussions regarding [place].  Patient is currently intubated on ventilator support. MV: 15.3 L/min Temp (24hrs), Avg:98.4 F (36.9 C), Min:98 F (36.7 C), Max:98.8 F (37.1 C) BP: 94/57 MAP: 69  Medications reviewed and include: SSI, Novolog 2-6 units q 4 hours, Levemir 8 units q 12 hours 300 ml free water every 4 hours Labs reviewed: sodium 150 (H)  CBG's: [phone]  I/O's: +8 L since admit Deep pitting edema   Diet Order:   Diet Order            Diet NPO time specified  Diet  effective now              EDUCATION NEEDS:   No education needs have been identified at this time  Skin:  Skin Assessment: Reviewed RN Assessment  Last BM:  500 ml via rectal tube  Height:   Ht Readings from Last 1 Encounters:  08/29/18 5\' 7"  (1.702 m)    Weight:   Wt Readings from Last 1 Encounters:  09/01/18 76.8 kg    Ideal Body Weight:  67.2 kg  BMI:  Body mass index is 26.52 kg/m.  Estimated Nutritional Needs:   Kcal:  3149  Protein:  105-125 grams  Fluid:  > 1.8 L/day  Maylon Peppers RD, LDN, CNSC [phone] Pager [phone] After Hours Pager

## 2018-09-01 NOTE — Progress Notes (Signed)
Pt transported on ventilator from 4N27 to 3M10 with RN and SWOT RN without complications.

## 2018-09-01 NOTE — Significant Event (Signed)
Spoke with patient's sister Letitia Libra, who confirmed that she and patient's daughter Juliann Pulse will come to visit with patient tomorrow7/09/2018, around 10am.     Camary Sosa

## 2018-09-01 NOTE — Progress Notes (Signed)
Daily Progress Note   Patient Name: Timothy Garcia       Date: 09/01/2018 DOB: 10-09-1934  Age: 84 y.o. MRN#: [id number] Attending Physician: Rigoberto Noel, MD Primary Care Physician: Rogers Blocker, MD Admit Date: 08/24/2018  Reason for Consultation/Follow-up: Establishing goals of care  Subjective: Patient is resting in bed.  He is lying in bed with his eyes closed.  He is able to open his eyes when his name is called, he is able to wave with his right hand.  Continues with endotracheal tube.  Was briefly on a weaning trial that lasted for approximately 1 hour but failed weaning trial and had to be placed on ventilatory support.  While the patient is able to open eyes, track me in the room, follows some simple commands, patient is, in my opinion , unable to fully comprehend how seriously ill he is and not able to make decisions for himself.  A phone conference family meeting was arranged for 09-01-2018 at 10 AM.  See below.  lLength of Stay: 12  Current Medications: Scheduled Meds:  . sodium chloride   Intravenous Once  . bethanechol  10 mg Per Tube TID  . chlorhexidine gluconate (MEDLINE KIT)  15 mL Mouth Rinse BID  . Chlorhexidine Gluconate Cloth  6 each Topical Daily  . enoxaparin (LOVENOX) injection  70 mg Subcutaneous Q12H  . feeding supplement (PRO-STAT SUGAR FREE 64)  60 mL Per Tube BID  . fluconazole  400 mg Per Tube Daily  . free water  300 mL Per Tube Q4H  . furosemide  40 mg Intravenous Once  . insulin aspart  2-6 Units Subcutaneous Q4H  . insulin detemir  8 Units Subcutaneous Q12H  . ipratropium-albuterol  3 mL Nebulization Q6H  . mouth rinse  15 mL Mouth Rinse 10 times per day  . pantoprazole (PROTONIX) IV  40 mg Intravenous QHS  . sodium chloride flush  10-40 mL  Intracatheter Q12H  . sodium chloride flush  3 mL Intravenous Once    Continuous Infusions: . sodium chloride Stopped (09/01/18 0956)  . dextrose 40 mL/hr at 09/01/18 1000  . feeding supplement (VITAL 1.5 CAL) 1,000 mL (09/01/18 1015)  . meropenem (MERREM) IV 200 mL/hr at 09/01/18 1000  . potassium chloride      PRN Meds: sodium chloride, albuterol,  dextrose, fentaNYL (SUBLIMAZE) injection, fentaNYL (SUBLIMAZE) injection, labetalol, midazolam, ondansetron (ZOFRAN) IV, sodium chloride flush, sodium chloride flush  Physical Exam         General: Sleepy today, but arousable HEENT: ETT Heart: Irregular, tachycardic. No murmur appreciated. Lungs: Good air movement, coarse Abdomen: Soft, nontender, nondistended, positive bowel sounds.  Ext: + edema Skin: Warm and dry  Vital Signs: BP 125/72 (BP Location: Left Arm)   Pulse 95   Temp 98.8 F (37.1 C) (Axillary)   Resp 19   Ht _0  (1.702 m)   Wt 76.8 kg   SpO2 94%   BMI 26.52 kg/m  SpO2: SpO2: 94 % O2 Device: O2 Device: Ventilator O2 Flow Rate: O2 Flow Rate (L/min): 15 L/min  Intake/output summary:   Intake/Output Summary (Last 24 hours) at 09/01/2018 1056 Last data filed at 09/01/2018 1000 Gross per 24 hour  Intake 1203.16 ml  Output 3215 ml  Net -2011.84 ml   LBM: Last BM Date: 08/31/18 Baseline Weight: Weight: 73.1 kg Most recent weight: Weight: 76.8 kg       Palliative Assessment/Data:    Flowsheet Rows     Most Recent Value  Intake Tab  Referral Department  Critical care  Unit at Time of Referral  ICU  Palliative Care Primary Diagnosis  Cancer  Palliative Care Type  New Palliative care  Reason for referral  Clarify Goals of Care  Date first seen by Palliative Care  08/20/18  Clinical Assessment  Palliative Performance Scale Score  20%  Pain Max last 24 hours  4  Pain Min Last 24 hours  3  Dyspnea Max Last 24 Hours  4  Dyspnea Min Last 24 hours  3  Psychosocial & Spiritual Assessment  Palliative Care  Outcomes      Patient Active Problem List   Diagnosis Date Noted  . Fungemia 08/26/2018  . Acute hypoxemic respiratory failure ([place]) 08/20/2018  . Aspiration pneumonia of both lower lobes ([place])   . Palliative care by specialist   . Goals of care, counseling/discussion   . Sepsis ([place]) 07/16/2018  . Hyperglycemia 07/16/2018  . Benign essential HTN 07/16/2018  . AMS (altered mental status) 07/16/2018  . COPD with acute exacerbation ([place]) 07/16/2018  . CKD (chronic kidney disease) stage 3, GFR 30-59 ml/min (HCC) 07/16/2018  . Emphysema of lung ([place]) 07/16/2018  . NSVT (nonsustained ventricular tachycardia) ([place]) 07/16/2018    Palliative Care Assessment & Plan   Patient Profile:    Assessment:  Timothy Garcia a 84 y.o.malewho resides at [facility] and has hx recurrent aspiration, admitted 6/24 with hypoxic respiratory failure presumed due to aspiration. Required intubation in ED. 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  The patient remains intubated, PMT consult for ongoing goals of care discussions.   Recommendations/Plan:  Continue current mode of care  Family meeting:  Call placed and discussed with patient's Sister Jerrye Bushy, patient's daughter Juliann Pulse, sister [name] Crawford's granddaughter Chantel was also present on the phone call. We reviewed about the patient's current condition, patient's underlying pre-existing comorbidities, patient's hospital course thus far.  We recalled previous family meetings and communication that has occurred between various hospital staff and the family.  Discussed about broad goals of care.    Patient's family has questions about whether or not the patient's pneumonia will ever heal, they had questions about whether the patient  would get better after undergoing a  tracheostomy.  I gave them information about one-way extubation in detail including comfort measures.  Patient's family believes that due to visitor restrictions in place because of COVID-19, the patient is "depressed" over not being able to see his family.  They want to give him a fighting chance if he has 1.  At this moment, I did not get the impression that they are in favor of one-way extubation/comfort measures.  Patient's daughter Juliann Pulse who lives in [place] is currently in [place], [place].  She wishes to come to the hospital for a brief period of time to have one-on-one conversations directly with her father the patient.  Entire family endorses that they would feel most comfortable if the patient himself made his medical decisions.  All of the family's questions addressed to the best of my ability.  Also discussed with Dr. Elsworth Soho from critical care medicine.  Patient continues to require ventilatory support, failing weaning attempts has a high likelihood of being ventilator dependent/machine dependent going forward.  Plan: Coordinating a visit for the patient's daughter Juliann Pulse to come see the patient to determine next steps with regards to one-way extubation versus tracheostomy placement.  Palliative medicine team to continue to follow.    Goals of Care and Additional Recommendations:  Limitations on Scope of Treatment: Full Scope Treatment  Code Status:    Code Status Orders  (From admission, onward)         Start     Ordered   08/20/18 0153  Full code  Continuous     08/20/18 0154        Code Status History    Date Active Date Inactive Code Status Order ID Comments User Context   07/16/2018 2113 07/21/2018 2110 Full Code [id number]  Elwyn Reach, MD Inpatient   05/20/2018 2308 06/29/2018 1744 Full Code [id number]  Horald Chestnut Inpatient   Advance Care Planning Activity       Prognosis:   Unable to determine Guarded  Discharge Planning:  To Be Determined  Care  plan was discussed with patient (intubated) and sister, sister's granddaughter, patient's daughter via phone Also discussed with Dr. Elsworth Soho Thank you for allowing the Palliative Medicine Team to assist in the care of this patient.   Time: 10:00-10:35  Total Time 35 Prolonged Time Billed  no       Greater than 50%  of this time was spent counseling and coordinating care related to the above assessment and plan.  Loistine Chance, MD [place] Team [phone]  Please contact Palliative Medicine Team phone at [phone] for questions and concerns.

## 2018-09-01 NOTE — Significant Event (Signed)
Patient transferred safely to 3M10 via bed. No personal belongings noted at bedside of Timothy Garcia. Patient's sister Timothy Garcia made aware of the transfer. Report given to receiving staff. Staff in room prior to RN leaving.   Spoke with patient's sister Timothy Garcia this morning to confirm visitation. Per Timothy Garcia, visitation will be for tomorrow but is awaiting for confirmation from her niece Timothy Garcia (patient's daughter).     Ciaran Begay

## 2018-09-01 NOTE — Progress Notes (Signed)
NAME:  Timothy Garcia, MRN:  [id number], DOB:  01-14-1935, LOS: 12 ADMISSION DATE:  08/11/2018, CONSULTATION DATE:  08/20/18 REFERRING MD:  Betsey Holiday  CHIEF COMPLAINT:  SOB   Brief History   Timothy Garcia is a 84 y.o. male who resides at SNF and has hx recurrent aspiration, admitted 6/24 with hypoxic respiratory failure with bibasal infiltrates presumed due to aspiration.  Required intubation in ED.   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  Significant Hospital Events   6/25 > admit.  Intubated, culture sent, IV fluids started, antibiotics initiated  Consults:  None.  Procedures:  ETT 6/24 >  5/25 L picc->6/27  Significant Diagnostic Tests:   6/26 echo:  nml LVSF, No definite vegetation identified but would need TEE to definitively rule out.  Micro Data:  Blood 6/24 > candida albicans Urine 6/25 > neg U. Strep 6/25 > negative U. Legionella 6/25 > negative SARS CoV2 6/24 > negative. Blood 6/27: >> Negative 6/29 sputum>> ESBL Klebsiella >> sensitive to meropenem  Antimicrobials:  Vanc 6/24 > 6/26 Cefepime 6/24 >  6/26 unasyn 6/26-> 08/26/2018 Fluconazole 6/26-> Meropenem 08/26/2018>>   Interim history/subjective:  Failed weaning again this morning High peak pressures on the vent Afebrile Critically ill, intubated    Objective:  Blood pressure (!) 114/59, pulse (!) 116, temperature 98.8 F (37.1 C), temperature source Axillary, resp. rate (!) 28, height 5\' 7"  (1.702 m), weight 76.8 kg, SpO2 93 %.    Vent Mode: PRVC FiO2 (%):  [30 %] 30 % Set Rate:  [20 bmp] 20 bmp Vt Set:  [520 mL] 520 mL PEEP:  [5 cmH20] 5 cmH20 Pressure Support:  [12 cmH20] 12 cmH20 Plateau Pressure:  [18 cmH20-26 cmH20] 19 cmH20   Intake/Output Summary (Last 24 hours) at 09/01/2018 1005 Last data filed at 09/01/2018 0700 Gross per 24 hour  Intake  1044.9 ml  Output 3000 ml  Net -1955.1 ml   Filed Weights   08/30/18 0500 08/31/18 0500 09/01/18 0440  Weight: 79.8 kg 77.2 kg 76.8 kg    Examination: Unchanged General: Elderly male , chronically ill-appearing, NAD,  HEENT: AT/[name]; EOMI; orally intubated, tongue midline; moist mucosa Neuro: Tremulous, weak, follows commands.  CV: Irregular S1-S2 PULM: Diminished bilateral GI: Nontender, distended, positive bowel sounds, Flexi-Seal has liquid stool Extremities: warm/dry, 1+ edema  Skin: no rashes or lesions  Chest x-ray 7/7 personally reviewed which shows stable right more than left interstitial/alveolar infiltrates   Assessment & Plan:   Critically ill due to acute hypoxic respiratory failure requiring intubation - presumed due to aspiration especially in light of underlying hx of recurrent aspiration. -Continues to fail weaning attempts, only able to do 12-15/5 for a few minutes --Ongoing palliative care discussions with family, will need tracheostomy if they want [name] to push forward   Aspiration pneumonia  Candida albicans fungemia, PICC and TPN discontinued  Meropenem until 7/7 Fluconazole until 7/11   Hx COPD.  Severe with FEV1 less than 30%.  No current evidence of exacerbation Plan Continue bronchodilators No need for steroids   Atrial fibrillation with episodes of RVR currently blood pressure within goal Plan Rate control strategy Treatment dose Lovenox will be held prior to tracheostomy  Chronic HFpEF:  Stable at this point  AKI: Suspect ATN, creatinine stable Hypernatremia:   Sodium  decreased from 1 55-1 47 , rose again to 150 and  stable Continue free water 300 cc every 4 hours ct Lasix 40 daily for negative balance for anasarca  Protein calorie malnutrition, moderate Hx gastric adenocarcinoma - on chronic TPN.  Intermittent vomiting -causing tube feeds to be held, resume at trickle, use Reglan Currently off TPN due to fungemia from PICC line.   Anemia acute on chronic: Transfuse for hb 7 or lower  Hx DVTs / PE - s/p IVC filter and on lovenox. RUE swelling:  Right upper extremity ultrasound reveals findings consistent with acute superficial vein thrombosis involving the right cephalic vein. Plan Ct low molecular weight heparin every 12 hourly   Hx DM. Episode of hypoglycemia 7/3 Plan Sliding scale insulin protocol  on D10 if  TFs held   Failure to thrive. He was followed by palliative care in the outpatient setting.  Review of their notes demonstrate ongoing weight loss, poor activity tolerance, also suggests fairly poor insight to his prognosis Plan Ongoing conversations between palliative care and sister, patient is unable to clearly indicate 1 way or the other about tracheostomy -that seems to be the only option if family wants to push forward although I do not anticipate good outcome here    Best Practice:  Diet: tf currently at 30/hr advancing as tolerated Pain/Anxiety/Delirium protocol (if indicated): per protocol VAP protocol (if indicated): In place. DVT prophylaxis: SCD's / Lovenox. GI prophylaxis: PPI. Glucose control: SSI. Mobility: Bedrest. Code Status: Full.  Palliative care involved Family Communication: No family available at this time Disposition: ICU.  7/6 updated sister Timothy Garcia concerning possible tracheostomy versus palliative care she is going to call his daughter in [place] for guidance.   The patient is critically ill with multiple organ systems failure and requires high complexity decision making for assessment and support, frequent evaluation and titration of therapies, application of advanced monitoring technologies and extensive interpretation of multiple databases. Critical Care Time devoted to patient care services described in this note independent of APP/resident  time is 31 minutes.    Kara Mead MD. [facility] Flood. [facility] Pager [phone] If no response call [phone]     09/01/18 10:05 AM

## 2018-09-02 LAB — BASIC METABOLIC PANEL
Anion gap: 8 (ref 5–15)
BUN: 23 mg/dL (ref 8–23)
CO2: 24 mmol/L (ref 22–32)
Calcium: 8.5 mg/dL — ABNORMAL LOW (ref 8.9–10.3)
Chloride: 114 mmol/L — ABNORMAL HIGH (ref 98–111)
Creatinine, Ser: 1.41 mg/dL — ABNORMAL HIGH (ref 0.61–1.24)
GFR calc Af Amer: 53 mL/min — ABNORMAL LOW (ref >60)
GFR calc non Af Amer: 46 mL/min — ABNORMAL LOW (ref >60)
Glucose, Bld: 169 mg/dL — ABNORMAL HIGH (ref 70–99)
Potassium: 3.1 mmol/L — ABNORMAL LOW (ref 3.5–5.1)
Sodium: 146 mmol/L — ABNORMAL HIGH (ref 135–145)

## 2018-09-02 LAB — CBC
HCT: 26.2 % — ABNORMAL LOW (ref 39.0–52.0)
Hemoglobin: 7.7 g/dL — ABNORMAL LOW (ref 13.0–17.0)
MCH: 24.9 pg — ABNORMAL LOW (ref 26.0–34.0)
MCHC: 29.4 g/dL — ABNORMAL LOW (ref 30.0–36.0)
MCV: 84.8 fL (ref 80.0–100.0)
Platelets: 441 10*3/uL — ABNORMAL HIGH (ref 150–400)
RBC: 3.09 MIL/uL — ABNORMAL LOW (ref 4.22–5.81)
RDW: 20.5 % — ABNORMAL HIGH (ref 11.5–15.5)
WBC: 10.4 10*3/uL (ref 4.0–10.5)
nRBC: 0.3 % — ABNORMAL HIGH (ref 0.0–0.2)

## 2018-09-02 LAB — GLUCOSE, CAPILLARY
Glucose-Capillary: 116 mg/dL — ABNORMAL HIGH (ref 70–99)
Glucose-Capillary: 141 mg/dL — ABNORMAL HIGH (ref 70–99)
Glucose-Capillary: 161 mg/dL — ABNORMAL HIGH (ref 70–99)
Glucose-Capillary: 169 mg/dL — ABNORMAL HIGH (ref 70–99)
Glucose-Capillary: 182 mg/dL — ABNORMAL HIGH (ref 70–99)

## 2018-09-02 LAB — MAGNESIUM: Magnesium: 1.8 mg/dL (ref 1.7–2.4)

## 2018-09-02 MED ORDER — HALOPERIDOL LACTATE 5 MG/ML IJ SOLN: 0.5000 mg | INTRAMUSCULAR | Status: DC | PRN

## 2018-09-02 MED ORDER — ACETAMINOPHEN 325 MG PO TABS
650.0000 mg | ORAL_TABLET | Freq: Four times a day (QID) | ORAL | Status: DC | PRN
  Administered 2018-09-04: 650 mg via ORAL
  Filled 2018-09-02: qty 2

## 2018-09-02 MED ORDER — MORPHINE SULFATE (PF) 2 MG/ML IV SOLN: 1.0000 mg | INTRAVENOUS | Status: DC | PRN

## 2018-09-02 MED ORDER — BIOTENE DRY MOUTH MT LIQD: 15.0000 mL | OROMUCOSAL | Status: DC | PRN

## 2018-09-02 MED ORDER — HALOPERIDOL 0.5 MG PO TABS
0.5000 mg | ORAL_TABLET | ORAL | Status: DC | PRN
  Filled 2018-09-02: qty 1

## 2018-09-02 MED ORDER — ONDANSETRON HCL 4 MG/2ML IJ SOLN: 4.0000 mg | Freq: Four times a day (QID) | INTRAMUSCULAR | Status: DC | PRN

## 2018-09-02 MED ORDER — POLYVINYL ALCOHOL 1.4 % OP SOLN
1.0000 [drp] | Freq: Four times a day (QID) | OPHTHALMIC | Status: DC | PRN
  Filled 2018-09-02: qty 15

## 2018-09-02 MED ORDER — GLYCOPYRROLATE 0.2 MG/ML IJ SOLN: 0.2000 mg | INTRAMUSCULAR | Status: DC | PRN

## 2018-09-02 MED ORDER — FUROSEMIDE 10 MG/ML IJ SOLN
40.0000 mg | Freq: Once | INTRAMUSCULAR | Status: AC
  Administered 2018-09-02: 40 mg via INTRAVENOUS
  Filled 2018-09-02: qty 4

## 2018-09-02 MED ORDER — POTASSIUM CHLORIDE 20 MEQ/15ML (10%) PO SOLN: 40.0000 meq | ORAL | Status: DC

## 2018-09-02 MED ORDER — LORAZEPAM 2 MG/ML PO CONC: 1.0000 mg | ORAL | Status: DC | PRN

## 2018-09-02 MED ORDER — SODIUM CHLORIDE 0.9 % IV SOLN
INTRAVENOUS | Status: DC
  Administered 2018-09-02: 17:00:00 via INTRAVENOUS

## 2018-09-02 MED ORDER — GLYCOPYRROLATE 1 MG PO TABS
1.0000 mg | ORAL_TABLET | ORAL | Status: DC | PRN
  Filled 2018-09-02: qty 1

## 2018-09-02 MED ORDER — LORAZEPAM 2 MG/ML IJ SOLN
1.0000 mg | INTRAMUSCULAR | Status: DC | PRN
  Administered 2018-09-04: 1 mg via INTRAVENOUS
  Filled 2018-09-02: qty 1

## 2018-09-02 MED ORDER — ONDANSETRON 4 MG PO TBDP: 4.0000 mg | ORAL_TABLET | Freq: Four times a day (QID) | ORAL | Status: DC | PRN

## 2018-09-02 MED ORDER — ACETAMINOPHEN 650 MG RE SUPP: 650.0000 mg | Freq: Four times a day (QID) | RECTAL | Status: DC | PRN

## 2018-09-02 MED ORDER — HALOPERIDOL LACTATE 2 MG/ML PO CONC
0.5000 mg | ORAL | Status: DC | PRN
  Filled 2018-09-02: qty 0.3

## 2018-09-02 MED ORDER — LORAZEPAM 1 MG PO TABS: 1.0000 mg | ORAL_TABLET | ORAL | Status: DC | PRN

## 2018-09-02 NOTE — Progress Notes (Signed)
Daily Progress Note   Patient Name: Timothy Garcia       Date: 09/02/2018 DOB: 11/30/1934  Age: 84 y.o. MRN#: [id number] Attending Physician: Rigoberto Noel, MD Primary Care Physician: Rogers Blocker, MD Admit Date: 08/18/2018  Reason for Consultation/Follow-up: Establishing goals of care  Subjective: Patient is resting in bed.  He is lying in bed with his eyes closed.  He appears less alert, appears chronically ill.    lLength of Stay: 13  Current Medications: Scheduled Meds:   sodium chloride   Intravenous Once   bethanechol  10 mg Per Tube TID   chlorhexidine gluconate (MEDLINE KIT)  15 mL Mouth Rinse BID   Chlorhexidine Gluconate Cloth  6 each Topical Daily   furosemide  40 mg Intravenous Once   ipratropium-albuterol  3 mL Nebulization Q6H   mouth rinse  15 mL Mouth Rinse 10 times per day   pantoprazole (PROTONIX) IV  40 mg Intravenous QHS   sodium chloride flush  10-40 mL Intracatheter Q12H   sodium chloride flush  3 mL Intravenous Once    Continuous Infusions:  sodium chloride 10 mL/hr at 09/02/18 1000   sodium chloride      PRN Meds: sodium chloride, acetaminophen **OR** acetaminophen, albuterol, antiseptic oral rinse, fentaNYL (SUBLIMAZE) injection, fentaNYL (SUBLIMAZE) injection, glycopyrrolate **OR** glycopyrrolate **OR** glycopyrrolate, haloperidol **OR** haloperidol **OR** haloperidol lactate, labetalol, LORazepam **OR** LORazepam **OR** LORazepam, midazolam, morphine injection, ondansetron **OR** ondansetron (ZOFRAN) IV, polyvinyl alcohol, sodium chloride flush, sodium chloride flush  Physical Exam         General: Sleepy today, but arousable HEENT: ETT Heart: Irregular, tachycardic. No murmur appreciated. Lungs: Good air movement, coarse Abdomen: Soft,  nontender, nondistended, positive bowel sounds.  Ext: + edema Skin: Warm and dry  Vital Signs: BP (!) 96/48    Garcia (!) 110    Temp 99.6 F (37.6 C) (Oral)    Resp (!) 25    Ht '5\' 7"'$  (1.702 m)    Wt 70.8 kg    SpO2 98%    BMI 24.45 kg/m  SpO2: SpO2: 98 % O2 Device: O2 Device: Ventilator O2 Flow Rate: O2 Flow Rate (L/min): 15 L/min  Intake/output summary:   Intake/Output Summary (Last 24 hours) at 09/02/2018 1120 Last data filed at 09/02/2018 1000 Gross per 24 hour  Intake 2125.06 ml  Output 3305 ml  Net -1179.94 ml   LBM: Last BM Date: 09/01/18 Baseline Weight: Weight: 73.1 kg Most recent weight: Weight: 70.8 kg       Palliative Assessment/Data:    Flowsheet Rows     Most Recent Value  Intake Tab  Referral Department  Critical care  Unit at Time of Referral  ICU  Palliative Care Primary Diagnosis  Cancer  Palliative Care Type  New Palliative care  Reason for referral  Clarify Goals of Care  Date first seen by Palliative Care  08/20/18  Clinical Assessment  Palliative Performance Scale Score  20%  Pain Max last 24 hours  4  Pain Min Last 24 hours  3  Dyspnea Max Last 24 Hours  4  Dyspnea Min Last 24 hours  3  Psychosocial & Spiritual Assessment  Palliative Care Outcomes      Patient Active Problem List   Diagnosis Date Noted   Abdominal discomfort    Fungemia 08/26/2018   Acute hypoxemic respiratory failure ([place]) 08/20/2018   Aspiration pneumonia of both lower lobes ([place])    Palliative care by specialist    Goals of care, counseling/discussion    Sepsis ([place]) 07/16/2018   Hyperglycemia 07/16/2018   Benign essential HTN 07/16/2018   AMS (altered mental status) 07/16/2018   COPD with acute exacerbation ([place]) 07/16/2018   CKD (chronic kidney disease) stage 3, GFR 30-59 ml/min ([place]) 07/16/2018   Emphysema of lung ([place]) 07/16/2018   NSVT (nonsustained ventricular tachycardia) ([place]) 07/16/2018    Palliative Care Assessment & Plan   Patient  Profile:    Assessment:  Timothy Smithis a 84 y.o.malewho [facility] and has hx recurrent aspiration, admitted 6/24 with hypoxic respiratory failure presumed due to aspiration. Required intubation in ED. 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  The patient remains intubated, PMT consult for ongoing goals of care discussions.   Recommendations/Plan:  Family Meeting: I met with the patient's sister Ms. Timothy Garcia, patient's daughter Timothy Garcia, patient's Sister Timothy Garcia's granddaughter Timothy Garcia who arrived at the bedside.  They had a chance to visit with the patient.  Subsequently, a family meeting was held outside the patient's room on the floor in the medical intensive care unit.  We reviewed again about several serious life limiting conditions the patient faces.  Patient has a history of underlying COPD patient has a history of having undergone surgery for gastric cancer has had fungemia has had functional decline and aspiration pneumonia.  Patient serum albumin is 1.3 g/dL.  Patient is not able to tolerate pressure support ventilation trials for any reasonable length of time for the past several days now.  There have been ongoing conversations with primary team which is pulmonary and critical care medicine as well as with palliative care about next steps, appropriate goals of care.  To this and, discussions about tracheostomy versus one-way extubation leading to comfort measures has been discussed extensively with family members over the course of the past several days.  Patient's sister Ms. Timothy Garcia states that she asked the patient directly whether or not he would want to have a surgery for placement of breathing tube in his neck.  Ms. Timothy Garcia states that she heard the patient say very clearly that he would not want the  surgery.  We discussed in detail about one-way extubation, compassionate liberation from the ventilator, discontinuation  of artificial ventilation, artificial nutrition, full scope of comfort measures and all that it entails.  We discussed about focusing on comfort feeds that includes sips/bites of Jell-O/applesauce/pudding or any such consistency when the patient is most awake/alert.  This is not to replenish calories this is for comfort feeding at end-of-life.  Discussed about thirst and hunger not being important issues towards end-of-life.  We also discussed about one-way extubation, use of supplemental oxygen afterwards.  Amount of care that focuses exclusively on comfort, appropriate symptom management of end-of-life symptoms such as air hunger, secretions, distress, anxiety, shortness of breath, pain discussed in detail.  Discussed about judicious use of opioids and benzodiazepines and supplemental oxygen with the goal being to keep the patient as comfortable as possible, recognizing that the patient is likely approaching end-of-life.  Additionally, information was given about hospice philosophy of care, the type of care that can be provided in a residential hospice.  We will proceed in a step-by-step fashion.  We will proceed with one-way extubation, discontinuation of artificial nutrition and establishment of other comfort measures.  After that, if appropriate, patient will be transferred to [address] out of the intensive care unit atmosphere.  After that, if appropriate, consideration will be given to the patient being transferred to residential hospice.  We briefly discussed about hospice of the [place].  Patient's daughter Timothy Garcia is present at the bedside and is distraught about the patient's current condition.  She states that her mother died less than 2 months ago in [facility].  Acknowledged her recent grief and her anticipatory grief over losing her father as well. Patient's  daughter Timothy Garcia is originally from [place].  She is going to be in town for the next few days.  The patient was living by himself in [place], [place].  Timothy Garcia states that she will be emptying out his house and cleaning up his belongings.    I have discussed with Dr. Valeta Harms from critical care service.  Also discussed with bedside RN.  Will request support from chaplain.  End-of-life care order set will be filled out.     Palliative medicine team to continue to follow.    Goals of Care and Additional Recommendations:  Limitations on Scope of Treatment: comfort care.   Code Status:    Code Status Orders  (From admission, onward)         Start     Ordered   08/20/18 0153  Full code  Continuous     08/20/18 0154        Code Status History    Date Active Date Inactive Code Status Order ID Comments User Context   07/16/2018 2113 07/21/2018 2110 Full Code [id number]  Elwyn Reach, MD Inpatient   05/20/2018 2308 06/29/2018 1744 Full Code [id number]  Horald Chestnut Inpatient   Advance Care Planning Activity       Prognosis:   guarded  Guarded  Discharge Planning:  Anticipated Hospital Death  Care plan was discussed with patient (intubated) and sister, sister's granddaughter, patient's daughter in person.  Also discussed with Dr. Valeta Harms and bedside RN.  Thank you for allowing the Palliative Medicine Team to assist in the care of this patient.   Time: 10:00-11.10  Total Time 70 Prolonged Time Billed Yes        Greater than 50%  of this time was spent counseling and coordinating care related to the above assessment and plan.  Loistine Chance, MD [place] Team [phone]  Please contact Palliative Medicine Team phone at [phone] for questions and concerns.  ° ° ° ° °

## 2018-09-02 NOTE — Progress Notes (Signed)
ETT holder replaced; ETT secured at 23 cm at the lip on the left side of mouth.

## 2018-09-02 NOTE — Progress Notes (Signed)
This chaplain responded to PMT consult for EOL spiritual care.  Upon arrival to the unit, the chaplain learned from the Pt. RN-Kaylee, the family just left the Pt. room.  The RN anticipates the family to return late afternoon for Pt. extubation. The RN agreed to page spiritual care for a pastoral presence as needed.

## 2018-09-02 NOTE — Progress Notes (Addendum)
NAME:  Timothy Garcia, MRN:  [id number], DOB:  [birth year], LOS: 78 ADMISSION DATE:  08/02/2018, CONSULTATION DATE:  08/20/18 REFERRING MD:  Betsey Holiday  CHIEF COMPLAINT:  SOB   Brief History   Timothy Garcia is a 84 y.o. male who resides at SNF and has hx recurrent aspiration, admitted 6/24 with hypoxic respiratory failure with bibasal infiltrates presumed due to aspiration.  Required intubation in ED.  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  Significant Hospital Events   6/25 > admit.  Intubated, culture sent, IV fluids started, antibiotics initiated  Consults:  None.  Procedures:  ETT 6/24 >  5/25 L picc->6/27  Significant Diagnostic Tests:   6/26 echo:  nml LVSF, No definite vegetation identified but would need TEE to definitively rule out. 6/29 Upper Extremity UE: Acute superficial vein thrombosis involving right cephalic vein  Micro Data:  Blood 6/24 > candida albicans Urine 6/25 > neg U. Strep 6/25 > negative U. Legionella 6/25 > negative SARS CoV2 6/24 > negative Blood 6/27: >> Negative 6/29 sputum>> ESBL Klebsiella >> sensitive to meropenem  Antimicrobials:  Vanc 6/24 > 6/26 Cefepime 6/24 >  6/26 unasyn 6/26-> 08/26/2018 Fluconazole 6/26-> Meropenem 08/26/2018>09/01/2018  Interim history/subjective:  Failed weaning trial on 7/7.  Remains intubated on life support.  Objective:  Blood pressure (!) 96/50, pulse (!) 110, temperature 99.3 F (37.4 C), temperature source Oral, resp. rate 20, height 5\' 7"  (1.702 m), weight 70.8 kg, SpO2 96 %.    Vent Mode: PRVC FiO2 (%):  [30 %] 30 % Set Rate:  [18 bmp-20 bmp] 18 bmp Vt Set:  [520 mL] 520 mL PEEP:  [5 cmH20] 5 cmH20 Plateau Pressure:  [16 cmH20-18 cmH20] 18 cmH20   Intake/Output Summary (Last 24 hours) at 09/02/2018 0827 Last data filed at 09/02/2018 0636 Gross per 24 hour   Intake 2291.82 ml  Output 3515 ml  Net -1223.18 ml   Net for Hospitalization +3.5L  Filed Weights   08/31/18 0500 09/01/18 0440 09/02/18 0500  Weight: 77.2 kg 76.8 kg 70.8 kg    Physical Exam:  General: 84 y.o. male, critically ill, on vent Cardio: irregularly irregular Lungs: vented breath sounds Abdomen: Soft, non-distended, positive bowel sounds Skin: warm and dry Extremities: 1+ pitting edema BUE, no edema BLE Neuro: tremulous, opens eyes, doesn't track, doesn't follow commands   Assessment & Plan:   Critically ill due to acute hypoxic respiratory failure requiring intubation - presumed due to aspiration especially in light of underlying hx of recurrent aspiration. - remains on vent - continues to fail weaning attempts, most recently 7/7 - continuing palliative discussions with family  - will need tracheostomy if family continues with aggressive treatment   Aspiration pneumonia  Candida albicans fungemia, PICC and TPN discontinued S/p 7 days meropenem (7/1-7/7) Cont Fluconazole until 7/11   Hx COPD.  Severe with FEV1 less than 30%.  No evidence of acute exacerbation Continue bronchodilators No need for steroids   Atrial fibrillation with episodes of RVR currently blood pressure within goal Rate control: Labetalol 10mg  q2h prn for HR >110 Treatment dose Lovenox should be held prior to tracheostomy  Chronic HFpEF:  Stable at this point   AKI: Suspect ATN, creatinine stable Hypernatremia:  Sodium pending this AM, stable at 150  Continue free water 300 cc every 4 hours Repeat Lasix IV 40mg    Protein calorie malnutrition, moderate  Hx gastric adenocarcinoma - on chronic TPN.  Tube feeds as tolerated, currently off, pending goals of care decision, if family wants aggressive treatment, would likely need PEG Currently off TPN due to fungemia from PICC line  Anemia acute on chronic: Transfuse for Hgb 7 or lower Monitor CBC  Hx DVTs / PE - s/p IVC filter and  on lovenox. Right Cephalic Vein Thrombosis Cont Lovenox at treatment dose q12hr  Hx DM. Episode of hypoglycemia 7/3 Sliding scale insulin protocol, levemir 8u q12h  on D10 if Tube feeds held  Goals of Care: Palliative follows in outpatient setting. Palliative consulted while inpatient, family discussions continuing.  Daughter to visit at bedside today.  Given multiple failed ventilator weaning attempts, patient is very likely to require mechanical life support long-term.  Best Practice:  Diet:TPN as tolerated  Pain/Anxiety/Delirium protocol (if indicated): per protocol VAP protocol (if indicated): In place. DVT prophylaxis: SCD's / Lovenox. GI prophylaxis: PPI. Glucose control: SSI. Mobility: Bedrest. Code Status: Full.  Palliative care involved Family Communication: Family in contact with Palliative Disposition: ICU.     Timothy Garcia, D.O.  PGY-2 Family Medicine  09/02/2018 9:26 AM    PCCM attending:  84 year old gentleman who resides in a skilled nursing facility secondary to recurrent aspiration.  Initially was admitted on August 19, 2018 with hypoxic respiratory failure and bibasilar infiltrates presumed aspiration.  He has a medical history of COPD DVT status post PE status post IVC filter placement, diabetes, hypertension had a focal stage I adenocarcinoma of the stomach with partial gastrectomy in March.  During the hospitalization has suffered from Candida albicans bacteremia from blood cultures.  Sputum with ESBL Klebsiella completed course of meropenem from 08/26/2018 to 09/01/2018.  Also on fluconazole for fungal pneumonia.  Patient was initially admitted to 4 N. intensive care unit.  Patient was relocated to the to medical ICU.  Today is my first day seeing and evaluating the patient.  During hospitalization there is also been a longstanding palliative care involvement.  There is a palliative care meeting scheduled for today 09/02/2018.  BP (!) 98/57   Pulse (!) 114    Temp 99.6 F (37.6 C) (Oral)   Resp (!) 22   Ht 5\' 7"  (1.702 m)   Wt 70.8 kg   SpO2 96%   BMI 24.45 kg/m   Gen: 84 year old gentleman, chronically ill, critically ill on mechanical ventilation Heart: Irregularly irregular, S1-S2 Lungs: Bilateral ventilated breath sounds Abdomen: Soft nontender nondistended Skin: Dependent edema in the upper extremities Extremities: PICC line in the right arm, dependent edema in the forearms trace edema in the lower extremities Neuro: Following no commands, is alert spontaneous movements of the extremities, blinking, does not track.  Labs reviewed Chest x-ray: Bilateral airspace disease. The patient's images have been independently reviewed by me.   Echocardiogram 08/21/2018: Ejection fraction 60-65%  A: Acute hypoxemic respiratory failure Recurrent aspiration Aspiration pneumonia with ESBL Klebsiella Candida albicans fungemia, PICC line and TPN discontinued Severe COPD FEV1 less than 30% Atrial fibrillation with RVR status post rate control Acute on chronic diastolic heart failure AKI, possible ATN, stable Moderate protein calorie malnutrition History of gastric adenocarcinoma status post TPN leading to development of fungemia as above  P: Agree with ongoing palliative care discussion. Patient has had difficulty weaning from mechanical ventilator for the past several days before transfer to the medical ICU. I do believe we need to have a clear direction before we move to prolonged mechanical support. Would prefer a direction to include  one-way extubation and DNI status. If patient was to continue to fail from a respiratory standpoint would transfer position to comfort care measures. Patient placed in pressure support ventilation this morning.  Seems to tolerate well for short period of time.  We will continue to wean upper end of pressure support to maintain adequate tidal volumes. Wean FiO2 to maintain SPO2 greater than 90%. We appreciate  palliative care input regarding [place]. Continue tube feeding Repeat dosing of Lasix today Follow urine output Completed course of meropenem Continue fluconazole until 27-Sep-2018. Continue scheduled bronchodilators with PRN  This patient is critically ill with multiple organ system failure; which, requires frequent high complexity decision making, assessment, support, evaluation, and titration of therapies. This was completed through the application of advanced monitoring technologies and extensive interpretation of multiple databases. During this encounter critical care time was devoted to patient care services described in this note for 33 minutes.   Garner Nash, DO [facility] 09/02/2018 10:15 AM  Personal pager: [phone] If unanswered, please page CCM On-call: [phone]

## 2018-09-02 NOTE — Progress Notes (Signed)
Nutrition Brief Note  Chart reviewed. Plan for one-way extubation this afternoon.  TF on hold due to [place] to transition to comfort care.  No further nutrition interventions warranted at this time.  Please re-consult as needed.   [employer] MS, RDN, LDN, CNSC [phone] Pager  [phone] Weekend/On-Call Pager

## 2018-09-03 LAB — PHOSPHORUS: Phosphorus: 4.1 mg/dL (ref 2.5–4.6)

## 2018-09-03 LAB — GLUCOSE, CAPILLARY: Glucose-Capillary: 186 mg/dL — ABNORMAL HIGH (ref 70–99)

## 2018-09-03 MED ORDER — POTASSIUM CHLORIDE 20 MEQ/15ML (10%) PO SOLN
40.0000 meq | ORAL | Status: AC
  Administered 2018-09-03: 40 meq
  Filled 2018-09-03: qty 30

## 2018-09-03 NOTE — Progress Notes (Addendum)
NAME:  Betzalel Umbarger, MRN:  [id number], DOB:  Feb 19, 1935, LOS: 34 ADMISSION DATE:  08/22/2018, CONSULTATION DATE:  08/20/18 REFERRING MD:  Betsey Holiday  CHIEF COMPLAINT:  SOB   Brief History   Esley Brooking is a 84 y.o. male who resides at SNF and has hx recurrent aspiration, admitted 6/24 with hypoxic respiratory failure with bibasal infiltrates presumed due to aspiration.  Required intubation in ED. Has remained intubated and failed multiple weaning attempts from ventilator.  On 7/8, family decided to proceed with comfort care and plan for one-way extubation.  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  Significant Hospital Events   6/25 > admit.  Intubated, culture sent, IV fluids started, antibiotics initiated 7/8 > move to comfort care  Consults:  Palliative Medicine  Procedures:  ETT 6/24 >  5/25 L picc->6/27  Significant Diagnostic Tests:   6/26 echo:  nml LVSF, No definite vegetation identified but would need TEE to definitively rule out. 6/29 Upper Extremity UE: Acute superficial vein thrombosis involving right cephalic vein  Micro Data:  Blood 6/24 > candida albicans Urine 6/25 > neg U. Strep 6/25 > negative U. Legionella 6/25 > negative SARS CoV2 6/24 > negative Blood 6/27: >> Negative 6/29 sputum>> ESBL Klebsiella >> sensitive to meropenem  Antimicrobials:  Vanc 6/24 > 6/26 Cefepime 6/24 >  6/26 unasyn 6/26-> 08/26/2018 Fluconazole 6/26->09/02/2018 Meropenem 08/26/2018>09/01/2018  Interim history/subjective:  Family decided to proceed with comfort care and one-way extubation.  Was febrile to 101.4 at 1947.  Patient remains intubated this AM.  Planning for one-way extubation today.  Objective:  Blood pressure (!) 99/55, pulse (!) 113, temperature 99.2 F (37.3 C), temperature source Axillary, resp. rate (!) 21, height 5\' 7"   (1.702 m), weight 70.1 kg, SpO2 99 %.    Vent Mode: PRVC FiO2 (%):  [30 %-40 %] 40 % Set Rate:  [18 bmp] 18 bmp Vt Set:  [520 mL] 520 mL PEEP:  [5 cmH20] 5 cmH20 Pressure Support:  [10 cmH20] 10 cmH20 Plateau Pressure:  [15 cmH20-25 cmH20] 15 cmH20   Intake/Output Summary (Last 24 hours) at 09/03/2018 0756 Last data filed at 09/03/2018 0600 Gross per 24 hour  Intake 419.71 ml  Output 1830 ml  Net -1410.29 ml    Filed Weights   09/01/18 0440 09/02/18 0500 09/03/18 0445  Weight: 76.8 kg 70.8 kg 70.1 kg    Physical Exam:  General: 84 y.o. male, critically ill, on vent Cardio: irregularly irregular Lungs: vented breath sounds Abdomen: Soft, non-tender to palpation, non-distended, positive bowel sounds Skin: warm and dry Extremities: No edema Neuro: alert, opens eyes to sounds, doesn't track with eyes, doesn't follow commands, spontaneous movement of limbs   Assessment & Plan:   Critically ill due to acute hypoxic respiratory failure requiring intubation - presumed due to aspiration especially in light of underlying hx of recurrent aspiration. Family decided on comfort care 7/8, plan for one-way extubation today. Palliative following.   Aspiration pneumonia, ESBL Klebsiella  Candida albicans fungemia, PICC and TPN discontinued S/p 7 days meropenem (7/1-7/7) Fluconazole d/c'ed in the setting of comfort care   Hx COPD.  Severe with FEV1 less than 30%.  No evidence of acute exacerbation Continue bronchodilators for comfort   Atrial fibrillation with episodes of RVR currently blood pressure within goal D/C PRN labetalol given comfort care D/C lovenox given comfort care   Chronic  HFpEF:  Comfort care per palliative   AKI: Suspect ATN, creatinine stable Hypernatremia:  No further labs or diuresis in the setting of comfort care   Protein calorie malnutrition, moderate Hx gastric adenocarcinoma - on chronic TPN.  No tube feeds in setting of comfort care   Anemia  acute on chronic: No longer monitoring   Hx DVTs / PE - s/p IVC filter and on lovenox. Right Cephalic Vein Thrombosis   Goals of Care: Palliative following patient Patient is now comfort care, planning for one-way extubation today  Best Practice:  Diet: NPO Pain/Anxiety/Delirium protocol (if indicated): per protocol VAP protocol (if indicated): In place. DVT prophylaxis: SCD's  GI prophylaxis: PPI. Glucose control: None Mobility: Bedrest. Code Status: DNR, comfort care, planning for one-way extubation today Family Communication: Family in contact with Palliative Disposition: one-way extubation today, could transfer to 6N afterwards if appropriate   [name], D.O.  PGY-2 Family Medicine  09/03/2018 7:56 AM

## 2018-09-03 NOTE — Progress Notes (Signed)
Throughout the day, I have spoken with seemingly all Timothy Garcia daughters in [place] to help facilitate Elink video chats (for [employer]) & speaking to him on speaker phone (for [place]). At this point, they have all been able to say their goodbyes to their father. Compassionate extubation planned for tomorrow. Palliative calling to arrange a time.

## 2018-09-03 NOTE — Progress Notes (Signed)
Palliative Medicine RN Note: Rec'd a call from daughter Timothy Garcia [phone]). Patient's other children in [place] are on the way to her house and are requesting a video call to say goodbye. Timothy Garcia told me that she was told they would have today to say goodbye and that we will remove the vent Friday (tomorrow). Timothy Garcia is very clear that they want Timothy Garcia off the vent; the question and confusion seems to focus on timing.  At this time, I have spoken with the pt's RN on 64M; she will be ready for a video call in about an hour at the family's request. Dr Rowe Pavy will follow up later today.  Marjie Skiff Jerian Morais, RN, BSN, [facility] Palliative Medicine Team 09/03/2018 8:55 AM Office [phone]

## 2018-09-03 NOTE — Progress Notes (Signed)
Patient resting at time of my visit. Spoke with patient nurse. No family presence. Nurse said that family decided to wait until tomorrow to withdraw life support. Unit staff will call when family is ready.  Jaclynn Major, [place], [facility], Pager [phone]

## 2018-09-03 NOTE — Progress Notes (Signed)
Daily Progress Note   Patient Name: Timothy Garcia       Date: 09/03/2018 DOB: May 18, 1934  Age: 84 y.o. MRN#: [id number] Attending Physician: Timothy Noel, MD Primary Care Physician: Timothy Blocker, MD Admit Date: 08/18/2018  Reason for Consultation/Follow-up: Establishing goals of care  Subjective: Patient is resting in bed.  He is lying in bed with his eyes closed.  He appears less alert, appears chronically ill.    lLength of Stay: 14  Current Medications: Scheduled Meds:  . sodium chloride   Intravenous Once  . bethanechol  10 mg Per Tube TID  . chlorhexidine gluconate (MEDLINE KIT)  15 mL Mouth Rinse BID  . Chlorhexidine Gluconate Cloth  6 each Topical Daily  . ipratropium-albuterol  3 mL Nebulization Q6H  . mouth rinse  15 mL Mouth Rinse 10 times per day  . pantoprazole (PROTONIX) IV  40 mg Intravenous QHS  . sodium chloride flush  10-40 mL Intracatheter Q12H  . sodium chloride flush  3 mL Intravenous Once    Continuous Infusions: . sodium chloride 10 mL/hr at 09/02/18 1500  . sodium chloride 10 mL/hr at 09/03/18 1400    PRN Meds: sodium chloride, acetaminophen **OR** acetaminophen, albuterol, antiseptic oral rinse, fentaNYL (SUBLIMAZE) injection, fentaNYL (SUBLIMAZE) injection, glycopyrrolate **OR** glycopyrrolate **OR** glycopyrrolate, haloperidol **OR** haloperidol **OR** haloperidol lactate, LORazepam **OR** LORazepam **OR** LORazepam, midazolam, morphine injection, ondansetron **OR** ondansetron (ZOFRAN) IV, polyvinyl alcohol, sodium chloride flush, sodium chloride flush  Physical Exam         General: Sleepy today, does not open eyes, does not follow commands HEENT: ETT Heart: Irregular, tachycardic. No murmur appreciated. Lungs: Good air movement, coarse Abdomen:  Soft, nontender, nondistended, positive bowel sounds.  Ext: + edema Skin: Warm and dry  Vital Signs: BP (!) 100/46   Pulse (!) 116   Temp (!) 100.7 F (38.2 C) (Oral)   Resp 19   Ht '5\' 7"'  (1.702 m)   Wt 70.1 kg   SpO2 96%   BMI 24.20 kg/m  SpO2: SpO2: 96 % O2 Device: O2 Device: Ventilator O2 Flow Rate: O2 Flow Rate (L/min): 15 L/min  Intake/output summary:   Intake/Output Summary (Last 24 hours) at 09/03/2018 1558 Last data filed at 09/03/2018 1400 Gross per 24 hour  Intake 319.72 ml  Output 1465 ml  Net -1145.28  ml   LBM: Last BM Date: 09/03/18 Baseline Weight: Weight: 73.1 kg Most recent weight: Weight: 70.1 kg       Palliative Assessment/Data:    Flowsheet Rows     Most Recent Value  Intake Tab  Referral Department  Critical care  Unit at Time of Referral  ICU  Palliative Care Primary Diagnosis  Cancer  Palliative Care Type  New Palliative care  Reason for referral  Clarify Goals of Care  Date first seen by Palliative Care  08/20/18  Clinical Assessment  Palliative Performance Scale Score  20%  Pain Max last 24 hours  4  Pain Min Last 24 hours  3  Dyspnea Max Last 24 Hours  4  Dyspnea Min Last 24 hours  3  Psychosocial & Spiritual Assessment  Palliative Care Outcomes      Patient Active Problem List   Diagnosis Date Noted  . Abdominal discomfort   . Fungemia 08/26/2018  . Acute hypoxemic respiratory failure ([place]) 08/20/2018  . Aspiration pneumonia of both lower lobes ([place])   . Palliative care by specialist   . Goals of care, counseling/discussion   . Sepsis ([place]) 07/16/2018  . Hyperglycemia 07/16/2018  . Benign essential HTN 07/16/2018  . AMS (altered mental status) 07/16/2018  . COPD with acute exacerbation ([place]) 07/16/2018  . CKD (chronic kidney disease) stage 3, GFR 30-59 ml/min ([facility]) 07/16/2018  . Emphysema of lung ([place]) 07/16/2018  . NSVT (nonsustained ventricular tachycardia) ([place]) 07/16/2018    Palliative Care Assessment & Plan    Patient Profile:    Assessment:  Timothy Garcia a 84 y.o.malewho resides at [facility] and has hx recurrent aspiration, admitted 6/24 with hypoxic respiratory failure presumed due to aspiration. Required intubation in ED. 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  The patient remains intubated, PMT consult for ongoing goals of care discussions.   Recommendations/Plan:  Call placed and re discussed one way extubation and comfort measures with sister Timothy Garcia, also discussed with palliative RN and bedside RN.    PLAN:  One way extubation at 19 AM on 09-04-2018, patient's sister will arrive to the hospital to be witht eh patient.       Goals of Care and Additional Recommendations:  Limitations on Scope of Treatment: comfort care.   Code Status:    Code Status Orders  (From admission, onward)         Start     Ordered   08/20/18 0153  Full code  Continuous     08/20/18 0154        Code Status History    Date Active Date Inactive Code Status Order ID Comments User Context   07/16/2018 2113 07/21/2018 2110 Full Code [id number]  Timothy Reach, MD Inpatient   05/20/2018 2308 06/29/2018 1744 Full Code [id number]  Timothy Garcia Inpatient   Advance Care Planning Activity       Prognosis:   guarded  Guarded  Discharge Planning:  Anticipated Hospital Death  Care plan was discussed with patient (intubated) and sister,  Also discussed with   bedside RN.  Thank you for allowing the Palliative Medicine Team to assist in the care of this patient.   Time: 1500  Total Time 1535 Prolonged Time Billed No       Greater than 50%  of this time was spent counseling and coordinating care  related to the above assessment and plan.  Timothy Chance, MD [place] Team [phone]  Please contact  Palliative Medicine Team phone at [phone] for questions and concerns.

## 2018-09-04 DIAGNOSIS — Z515 Encounter for palliative care: Secondary | ICD-10-CM

## 2018-09-04 MED ORDER — LORAZEPAM 2 MG/ML IJ SOLN
1.0000 mg | INTRAMUSCULAR | Status: DC | PRN
  Administered 2018-09-04 (×2): 1 mg via INTRAVENOUS
  Filled 2018-09-04 (×3): qty 1

## 2018-09-04 MED ORDER — SODIUM CHLORIDE 0.9 % IV SOLN
1.0000 mg/h | INTRAVENOUS | Status: DC
  Filled 2018-09-04: qty 5

## 2018-09-04 MED ORDER — LORAZEPAM 2 MG/ML PO CONC: 1.0000 mg | ORAL | Status: DC | PRN

## 2018-09-04 MED ORDER — HYDROMORPHONE BOLUS VIA INFUSION
1.0000 mg | INTRAVENOUS | Status: DC | PRN
  Administered 2018-09-04 (×2): 1 mg via INTRAVENOUS
  Filled 2018-09-04: qty 1

## 2018-09-04 MED ORDER — SODIUM CHLORIDE 0.9 % IV SOLN
1.0000 mg/h | INTRAVENOUS | Status: DC
  Administered 2018-09-04: 1 mg/h via INTRAVENOUS
  Filled 2018-09-04: qty 5

## 2018-09-04 MED ORDER — SODIUM CHLORIDE 0.9 % IV SOLN
1.0000 mg/h | INTRAVENOUS | Status: DC
  Administered 2018-09-04 – 2018-09-05 (×5): 12 mg/h via INTRAVENOUS
  Filled 2018-09-04 (×9): qty 5

## 2018-09-04 MED ORDER — LORAZEPAM 2 MG/ML IJ SOLN
0.5000 mg/h | INTRAVENOUS | Status: DC
  Filled 2018-09-04: qty 25

## 2018-09-04 MED ORDER — LORAZEPAM 1 MG PO TABS: 1.0000 mg | ORAL_TABLET | ORAL | Status: DC | PRN

## 2018-09-04 MED ORDER — HYDROMORPHONE BOLUS VIA INFUSION
1.0000 mg | INTRAVENOUS | Status: DC | PRN
  Administered 2018-09-04 (×4): 2 mg via INTRAVENOUS
  Filled 2018-09-04: qty 2

## 2018-09-04 MED ORDER — ORAL CARE MOUTH RINSE
15.0000 mL | Freq: Two times a day (BID) | OROMUCOSAL | Status: DC
  Administered 2018-09-04 – 2018-09-05 (×2): 15 mL via OROMUCOSAL

## 2018-09-04 NOTE — Progress Notes (Addendum)
PMT additional progress note  Patient was extubated at 1228 PM today, on 09-04-2018. Patient's sister and sister's grand daughter were present at the bedside since this morning.   We discussed about compassionate extubation, full scope of comfort measures. We discussed about end of life care, monitoring symptoms post extubation.   End of  Life order set was initiated, Dilaudid drip was started. Patient was given boluses, drip was up titrated, Robinul, Ativan and ongoing use of medications important for comfort were discussed as well, with family, PCCM, RT, RN.   BP (!) 111/53   Pulse (!) 112   Temp 100.1 F (37.8 C) (Oral)   Resp (!) 119   Ht 5\' 7"  (1.702 m)   Wt 69.3 kg   SpO2 94%   BMI 23.93 kg/m  No labs or imaging to review from today.  Unresponsive Extubated, appears comfortable In no distress currently Coarse breath sounds Shallow breathing No stridor No edema Abdomen not distended Extremities warm to touch, no coolness, no mottling detected.  S1 S2   Patient tolerated the extubation without complications. Oral care was provided. Supplemental O2 was initiated. Family was invited back in, to be present with the patient. Reviewed with them again, about end of life symptoms, noisy breathing, coarse breath sounds, use of accessory resp muscles and how to look for non verbal signs of distress or discomfort.   Prognosis likely hours to some very limited number of days at this point.   Additional 35 minutes spent.  Time in 12 Time out [place] team [phone]

## 2018-09-04 NOTE — Procedures (Signed)
Extubation Procedure Note  Patient Details:   Name: Deaken Jurgens DOB: 07-04-1934 MRN: [id number]   Airway Documentation:    Vent end date: 09/04/18 Vent end time: 1228   Evaluation  O2 sats: stable throughout Complications: No apparent complications Patient did tolerate procedure well. Bilateral Breath Sounds: Diminished   No  Earney Navy 09/04/2018, 2:20 PM

## 2018-09-04 NOTE — Progress Notes (Signed)
   09/04/18 1210  Clinical Encounter Type  Visited With Patient and family together  Visit Type Initial  Referral From Palliative care team  Consult/Referral To Chaplain  Spiritual Encounters  Spiritual Needs Prayer  Stress Factors  Family Stress Factors Major life changes  This chaplain responded to PMT request to be pastorally present with the Pt. and family during extubation.  The chaplain introduced herself to the Pt. sister and niece.  The Pt. sister is sitting bedside, both individuals are very quiet.  The chaplain explained her role and offered prayer with the family.  The sister accepted prayer with the tearful request for the Pt. to breath on his own. The chaplain continued to comfort the Pt. sister and offered F/U spiritual support as needed.

## 2018-09-04 NOTE — Progress Notes (Signed)
NAME:  Timothy Garcia, MRN:  [id number], DOB:  11/19/1934, LOS: 90 ADMISSION DATE:  08/17/2018, CONSULTATION DATE:  08/20/18 REFERRING MD:  Betsey Holiday  CHIEF COMPLAINT:  SOB   Brief History   Timothy Garcia is a 84 y.o. male who resides at SNF and has hx recurrent aspiration, admitted 6/24 with hypoxic respiratory failure with bibasal infiltrates presumed due to aspiration.  Required intubation in ED. Has remained intubated and failed multiple weaning attempts from ventilator.  On 7/8, family decided to proceed with comfort care and plan for one-way extubation.  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  Significant Hospital Events   6/25 > admit.  Intubated, culture sent, IV fluids started, antibiotics initiated 7/8 > move to comfort care  Consults:  Palliative Medicine  Procedures:  ETT 6/24 >  5/25 L picc->6/27  Significant Diagnostic Tests:   6/26 echo:  nml LVSF, No definite vegetation identified but would need TEE to definitively rule out. 6/29 Upper Extremity UE: Acute superficial vein thrombosis involving right cephalic vein  Micro Data:  Blood 6/24 > candida albicans Urine 6/25 > neg U. Strep 6/25 > negative U. Legionella 6/25 > negative SARS CoV2 6/24 > negative Blood 6/27: >> Negative 6/29 sputum>> ESBL Klebsiella >> sensitive to meropenem  Antimicrobials:  Vanc 6/24 > 6/26 Cefepime 6/24 >  6/26 unasyn 6/26-> 08/26/2018 Fluconazole 6/26->09/02/2018 Meropenem 08/26/2018>09/01/2018  Interim history/subjective:  Family decided one-way extubation to take place on 7/10 at 1100.  Febrile overnight to 101.4 at 2325.  No other acute events.  Objective:  Blood pressure 97/68, pulse (!) 112, temperature 99 F (37.2 C), temperature source Oral, resp. rate (!) 26, height 5\' 7"  (1.702 m), weight 69.3 kg, SpO2 94 %.    Vent Mode: PRVC  FiO2 (%):  [30 %-40 %] 30 % Set Rate:  [18 bmp] 18 bmp Vt Set:  [520 mL] 520 mL PEEP:  [5 cmH20] 5 cmH20 Plateau Pressure:  [15 cmH20-25 cmH20] 18 cmH20   Intake/Output Summary (Last 24 hours) at 09/04/2018 0755 Last data filed at 09/04/2018 0600 Gross per 24 hour  Intake 494.83 ml  Output 1095 ml  Net -600.17 ml   Hospitalization Net +4.2L  Filed Weights   09/02/18 0500 09/03/18 0445 09/04/18 0320  Weight: 70.8 kg 70.1 kg 69.3 kg    Physical Exam:  General: 84 y.o. male critically ill, intubated on mechanical ventilation Cardio: irregularly irregular Lungs: vented breath sounds Abdomen: Soft, non-tender to palpation, non-distended, positive bowel sounds Skin: warm and dry Extremities: No edema Neuro: opens eyes to voice, does not track, does not follow commands, spontaneous movement of extremities   Assessment & Plan:   Critically ill due to acute hypoxic respiratory failure requiring intubation - presumed due to aspiration especially in light of underlying hx of recurrent aspiration. After family able to video chat yesterday to say goodbyes, plan is for one-way extubation today at 58, sister planning to be present. Palliative following, appreciate care If appropriate, patient can transfer to 6N for EOL care   Aspiration pneumonia, ESBL Klebsiella  Candida albicans fungemia, PICC and TPN discontinued S/p 7 days meropenem (7/1-7/7) Fluconazole d/c'ed 7/8 in the setting of comfort care   Hx COPD.  Severe with FEV1 less than 30%.  No evidence of acute exacerbation Continue bronchodilators for comfort   Atrial fibrillation with episodes of RVR currently blood pressure within  goal Comfort care   Chronic HFpEF:  Comfort care   AKI: Suspect ATN, creatinine stable Hypernatremia:  No further labs or diuresis in the setting of comfort care   Protein calorie malnutrition, moderate Hx gastric adenocarcinoma - on chronic TPN.  No tube feeds in setting of comfort  care   Anemia acute on chronic: No longer monitoring   Hx DVTs / PE - s/p IVC filter Right Cephalic Vein Thrombosis   Goals of Care: Palliative following.  Family able to video chat on 7/9 to say goodbyes.  Planning for one-way extubation today at 1100.  Patient's sister planning to be present.  Best Practice:  Diet: NPO Pain/Anxiety/Delirium protocol (if indicated): per protocol VAP protocol (if indicated): In place. DVT prophylaxis: SCD's  GI prophylaxis: PPI. Glucose control: None Mobility: Bedrest. Code Status: DNR, comfort care, planning for one way extubation  Family Communication: Family will be at bedside for extubation Disposition: one-way extubation today at [place], D.O.  PGY-2 Family Medicine  09/04/2018 7:55 AM

## 2018-09-04 NOTE — Progress Notes (Signed)
Daily Progress Note   Patient Name: Timothy Garcia       Date: 09/04/2018 DOB: [birth year]  Age: 84 y.o. MRN#: [id number] Attending Physician: Rigoberto Noel, MD Primary Care Physician: Rogers Blocker, MD Admit Date: 08/05/2018  Reason for Consultation/Follow-up: Establishing goals of care  Subjective: Patient is resting in bed.  He is lying in bed with his eyes closed.  He appears less alert, appears chronically ill. Does not respond or interact.   Sister and another family member at bedside.    lLength of Stay: 15  Current Medications: Scheduled Meds:  . sodium chloride   Intravenous Once  . bethanechol  10 mg Per Tube TID  . chlorhexidine gluconate (MEDLINE KIT)  15 mL Mouth Rinse BID  . Chlorhexidine Gluconate Cloth  6 each Topical Daily  . ipratropium-albuterol  3 mL Nebulization Q6H  . mouth rinse  15 mL Mouth Rinse 10 times per day  . pantoprazole (PROTONIX) IV  40 mg Intravenous QHS  . sodium chloride flush  10-40 mL Intracatheter Q12H  . sodium chloride flush  3 mL Intravenous Once    Continuous Infusions: . sodium chloride 10 mL/hr at 09/02/18 1500  . sodium chloride 10 mL/hr at 09/04/18 1100  . HYDROmorphone      PRN Meds: sodium chloride, acetaminophen **OR** acetaminophen, albuterol, antiseptic oral rinse, fentaNYL (SUBLIMAZE) injection, fentaNYL (SUBLIMAZE) injection, glycopyrrolate **OR** glycopyrrolate **OR** glycopyrrolate, haloperidol **OR** haloperidol **OR** haloperidol lactate, HYDROmorphone **AND** HYDROmorphone, LORazepam **OR** LORazepam **OR** LORazepam, midazolam, morphine injection, ondansetron **OR** ondansetron (ZOFRAN) IV, polyvinyl alcohol, sodium chloride flush, sodium chloride flush  Physical Exam         General: Sleepy today, does not open eyes,  does not follow commands HEENT: ETT Heart: Irregular, tachycardic. No murmur appreciated. Lungs: Good air movement, coarse Abdomen: Soft, nontender, nondistended, positive bowel sounds.  Ext: + edema Skin: Warm and dry  Vital Signs: BP (!) 111/53   Pulse (!) 112   Temp 100.1 F (37.8 C) (Oral)   Resp (!) 119   Ht _0  (1.702 m)   Wt 69.3 kg   SpO2 94%   BMI 23.93 kg/m  SpO2: SpO2: 94 % O2 Device: O2 Device: Ventilator O2 Flow Rate: O2 Flow Rate (L/min): 15 L/min  Intake/output summary:   Intake/Output Summary (Last 24 hours) at 09/04/2018  1132 Last data filed at 09/04/2018 1100 Gross per 24 hour  Intake 514.83 ml  Output 1120 ml  Net -605.17 ml   LBM: Last BM Date: 09/04/18 Baseline Weight: Weight: 73.1 kg Most recent weight: Weight: 69.3 kg       Palliative Assessment/Data:    Flowsheet Rows     Most Recent Value  Intake Tab  Referral Department  Critical care  Unit at Time of Referral  ICU  Palliative Care Primary Diagnosis  Cancer  Palliative Care Type  New Palliative care  Reason for referral  Clarify Goals of Care  Date first seen by Palliative Care  08/20/18  Clinical Assessment  Palliative Performance Scale Score  20%  Pain Max last 24 hours  4  Pain Min Last 24 hours  3  Dyspnea Max Last 24 Hours  4  Dyspnea Min Last 24 hours  3  Psychosocial & Spiritual Assessment  Palliative Care Outcomes      Patient Active Problem List   Diagnosis Date Noted  . Abdominal discomfort   . Fungemia 08/26/2018  . Acute hypoxemic respiratory failure ([place]) 08/20/2018  . Aspiration pneumonia of both lower lobes ([place])   . Palliative care by specialist   . Goals of care, counseling/discussion   . Sepsis ([place]) 07/16/2018  . Hyperglycemia 07/16/2018  . Benign essential HTN 07/16/2018  . AMS (altered mental status) 07/16/2018  . COPD with acute exacerbation ([place]) 07/16/2018  . CKD (chronic kidney disease) stage 3, GFR 30-59 ml/min ([facility]) 07/16/2018  . Emphysema  of lung ([place]) 07/16/2018  . NSVT (nonsustained ventricular tachycardia) ([place]) 07/16/2018    Palliative Care Assessment & Plan   Patient Profile:    Assessment:  Timothy Garcia a 84 y.o.malewho resides at [facility] and has hx recurrent aspiration, admitted 6/24 with hypoxic respiratory failure presumed due to aspiration. Required intubation in ED. 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  The patient remains intubated, PMT consult for ongoing goals of care discussions.   Recommendations/Plan:  Family meeting with sister ms Sharlet [place] and another family member at bedside. Family has additional questions about the patient's fever last night. We discussed in detail about the patient's current condition, patient already having completed a round of antibiotics earlier, ongoing concerns for aspiration, another source of infection, possibility of central fever. Patient was alert enough 48 hours ago, when he told his sister that he did not want a tracheostomy placed. We reviewed again about the concept of comfort focused care in detail. Discussed that unfortunately, the patient remains at high risk for ongoing decline decompensation and death in spite of best measures due to several co morbidities.   We re discussed about using appropriate comfort medications, managing symptoms such as shortness of breath, anxiety, air hunger etc.   All of their concerns addressed to the best of my ability. On call chaplain has also been asked to assist with supportive care.   Prognosis remains markedly limited, we talked about managing Timothy Garcia symptoms aggressively post extubation and allowing for a natural course of dying to occur. Family at bedside is in agreement and proceeded to leave the room, they will remain in the room until further appropriate  arrangements can be done.   Starting Dilaudid infusion, also additional end of life care order set orders have been filled out.   PMT to follow.  Goals of Care and Additional Recommendations:  Limitations on Scope of Treatment: comfort care.   Code Status:    Code Status Orders  (From admission, onward)         Start     Ordered   08/20/18 0153  Full code  Continuous     08/20/18 0154        Code Status History    Date Active Date Inactive Code Status Order ID Comments User Context   07/16/2018 2113 07/21/2018 2110 Full Code [id number]  Elwyn Reach, MD Inpatient   05/20/2018 2308 06/29/2018 1744 Full Code [id number]  Horald Chestnut Inpatient   Advance Care Planning Activity       Prognosis:   guarded  Guarded  Discharge Planning:  Anticipated Hospital Death  Care plan was discussed with patient (intubated) and sister and another family member.  Also discussed with   bedside RN.  Thank you for allowing the Palliative Medicine Team to assist in the care of this patient.   Time: 11  Total Time 11.35 Prolonged Time Billed No       Greater than 50%  of this time was spent counseling and coordinating care related to the above assessment and plan.  Loistine Chance, MD [facility] Team [phone]  Please contact Palliative Medicine Team phone at [phone] for questions and concerns.

## 2018-09-09 ENCOUNTER — Telehealth: Payer: Self-pay

## 2018-09-09 NOTE — Telephone Encounter (Signed)
Received dc from [employer].   DC is for burial and a patient of Doctor Mannam.   DC will be taken to [address] 3MW for signature.  On 09/10/2018 Received dc back from Doctor Mannam.  I called the funeral home to let them know the dc is ready for pickup.

## 2018-09-11 LAB — CULTURE, BLOOD (ROUTINE X 2)

## 2018-09-26 NOTE — Progress Notes (Signed)
80 ml Dilaudid drip wasted in stericycle and witnessed by Hulan Amato and Dagoberto Ligas, RNs. Niece of pt called to change funeral home arrangements and referred them to bed control.

## 2018-09-26 NOTE — Progress Notes (Signed)
Pt passes away at 1328 with staff at bedside at the time of passing.  I notified the family which is sister, [name] and spoke to her daughter, Maryann Conners regarding the funeral home arrangements.  Dr. Elsworth Soho notified as well as Dr. Rowe Pavy.  [facility] ref # is [id number].

## 2018-09-26 NOTE — Discharge Summary (Addendum)
Physician Death Summary  Patient ID: Timothy Garcia MRN: [id number] DOB/AGE: 82-04-1934 82 y.o.  Admit date: 08/03/2018 Discharge date: September 25, 2018  Admission Diagnoses: Acute respiratory failure Aspiration  Discharge Diagnoses:  Acute respiratory failure Aspiration pneumonia Candida albicans fungemia Severe COPD Atrial fibrillation Acute kidney injury Gastric adenocarcinoma DVT/PE status post IVC filter  Discharged Condition: Deceased  Hospital Course:  Timothy Garcia is a 84 y.o. 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 He was admitted 6/24 with hypoxic respiratory failure with bibasal infiltrates presumed due to aspiration.  Required intubation in ED.   Remained in ICU and failed multiple weaning attempts from ventilator.  Noted to have Candida albicans fungemia and ESBL Klebsiella in sputum cultures.Treated with fluconazole and meropenem.  On 7/8, family decided to proceed with comfort care and plan for one-way extubation.  He was eventually extubated on 7/10 and placed on morphine drip and passed away the next day.  Consults: Palliative care  Signed: Sincerity Cedar 09/07/2018, 11:06 AM

## 2018-09-26 NOTE — Progress Notes (Signed)
Ativan 50mg  in D5W 94ml expired/returned. Order number [id number].  Wasted/witnessed by Benjamine Mola, Rph

## 2018-09-26 NOTE — Progress Notes (Signed)
[facility] and updated Tonye Becket on the time of death, same referral #.

## 2018-09-26 NOTE — Progress Notes (Addendum)
NAME:  Timothy Garcia, MRN:  [id number], DOB:  Nov 01, 1934, LOS: 89 ADMISSION DATE:  08/02/2018, CONSULTATION DATE:  08/20/18 REFERRING MD:  Betsey Holiday  CHIEF COMPLAINT:  SOB   Brief History   Timothy Garcia is a 84 y.o. male who resides at SNF and has hx recurrent aspiration, admitted 6/24 with hypoxic respiratory failure with bibasal infiltrates presumed due to aspiration.  Required intubation in ED. Has remained intubated and failed multiple weaning attempts from ventilator.  On 7/8, family decided to proceed with comfort care and plan for one-way extubation.  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  Significant Hospital Events   6/25 > admit.  Intubated, culture sent, IV fluids started, antibiotics initiated 7/8 > move to comfort care 7/10.  One-way extubation 7/11: Comfortable on Dilaudid infusion moving out of ICU Consults:  Palliative Medicine  Procedures:  ETT 6/24 >  5/25 L picc->6/27  Significant Diagnostic Tests:   6/26 echo:  nml LVSF, No definite vegetation identified but would need TEE to definitively rule out. 6/29 Upper Extremity UE: Acute superficial vein thrombosis involving right cephalic vein  Micro Data:  Blood 6/24 > candida albicans Urine 6/25 > neg U. Strep 6/25 > negative U. Legionella 6/25 > negative SARS CoV2 6/24 > negative Blood 6/27: >> Negative 6/29 sputum>> ESBL Klebsiella >> sensitive to meropenem  Antimicrobials:  Vanc 6/24 > 6/26 Cefepime 6/24 >  6/26 unasyn 6/26-> 08/26/2018 Fluconazole 6/26->09/02/2018 Meropenem 08/26/2018>09/01/2018  Interim history/subjective:  Appears comfortable on Dilaudid drip  Objective:  Blood pressure (Abnormal) 111/53, pulse 93, temperature 100.1 F (37.8 C), temperature source Oral, resp. rate (Abnormal) 23, height 5\' 7"  (1.702 m), weight 69.3 kg, SpO2 96 %.    Vent  Mode: PRVC FiO2 (%):  [30 %] 30 % Set Rate:  [18 bmp] 18 bmp Vt Set:  [520 mL] 520 mL PEEP:  [5 cmH20] 5 cmH20 Plateau Pressure:  [15 cmH20] 15 cmH20   Intake/Output Summary (Last 24 hours) at October 01, 2018 0840 Last data filed at 2018/10/01 0800 Gross per 24 hour  Intake 506.48 ml  Output 375 ml  Net 131.48 ml   Hospitalization Net +4.2L  Filed Weights   09/03/18 0445 09/04/18 0320 10-01-18 0500  Weight: 70.1 kg 69.3 kg 69.3 kg    Physical Exam:   General sedated 84 year old terminally ill male appears comfortable on Dilaudid infusion HEENT mucous membranes dry, pupils equal reactive, temporal wasting Pulmonary: Diminished throughout no accessory use appears comfortable Cardiac: Regular rate and rhythm Abdomen: Soft Extremities: Warm and dry Neuro: Sedated  Assessment & Plan:   Critically ill due to acute hypoxic respiratory failure requiring intubation - presumed due to aspiration especially in light of underlying hx of recurrent aspiration. Aspiration pneumonia, ESBL Klebsiella  Candida albicans fungemia, PICC and TPN discontinued Hx COPD.  Severe with FEV1 less than 30%.  No evidence of acute exacerbation Atrial fibrillation with episodes of RVR currently blood pressure within goal Chronic HFpEF:  AKI: Suspect ATN, creatinine stable Hypernatremia  Protein calorie malnutrition, moderate Hx gastric adenocarcinoma - on chronic TPN.  Anemia acute on chronic Hx DVTs / PE - s/p IVC filter Right Cephalic Vein Thrombosis  Discussion: Terminally ill status post recurrent aspiration pneumonia with resultant shock and multiple organ failure.  Extubated on 7/10 with plan for compassionate extubation and full scope of comfort measures as discussed extensively with the family.  Plan Continue full comfort care We will move him out of the intensive care  Best Practice:  Diet: NPO Pain/Anxiety/Delirium protocol (if indicated): per protocol VAP protocol (if indicated): In place.  DVT prophylaxis: SCD's  GI prophylaxis: PPI. Glucose control: None Mobility: Bedrest. Code Status: DNR, comfort care, planning for one way extubation  Family Communication: Family will be at bedside for extubation Disposition: Continue comfort measures, [facility] to assume care  Erick Colace ACNP-BC [place] Pager # [phone] OR # [phone] if no answer  Attending note: I have seen and examined the patient. History, labs and imaging reviewed.  Extubated yesterday.  Now on comfort care No acute events overnight.  Blood pressure (!) 111/53, pulse 93, temperature 100.1 F (37.8 C), temperature source Oral, resp. rate (!) 23, height 5\' 7"  (1.702 m), weight 69.3 kg, SpO2 96 %. Gen:      Terminally ill-appearing  HEENT:  EOMI, sclera anicteric Neck:     No masses; no thyromegaly Lungs:    Clear to auscultation bilaterally; normal respiratory effort CV:         Regular rate and rhythm; no murmurs Abd:      + bowel sounds; soft, non-tender; no palpable masses, no distension Ext:    No edema; adequate peripheral perfusion Skin:      Warm and dry; no rash Neuro: Sedated, no acute events.  Assessment/plan: 84 year old with recurrent aspiration, acute on chronic respiratory failure Extubated to comfort care.  Palliative care on board. Continue Dilaudid drip Stable for move out of the ICU.  Marshell Garfinkel MD [facility] Sep 11, 2018, 10:33 AM

## 2018-09-26 DEATH — deceased

## 2021-05-05 IMAGING — CT CT ANGIOGRAPHY CHEST
2 of 7 series · 18 of 46 positions shown · IV contrast (APPLIED)
Comparison: CT chest dated 02/09/2015.

CLINICAL DATA: Pneumonia.  Shortness of breath.

EXAM:
CT ANGIOGRAPHY CHEST WITH CONTRAST
TECHNIQUE: Multidetector CT imaging of the chest was performed using the
standard protocol during bolus administration of intravenous
contrast. Multiplanar CT image reconstructions and MIPs were
obtained to evaluate the vascular anatomy.
CONTRAST:  100mL OMNIPAQUE IOHEXOL 350 MG/ML SOLN

[Series 7: thins · axial · 0.69mm/px · z∈[-153,+113]mm · 15 of 427 slices shown]
[im 24/427  lung]
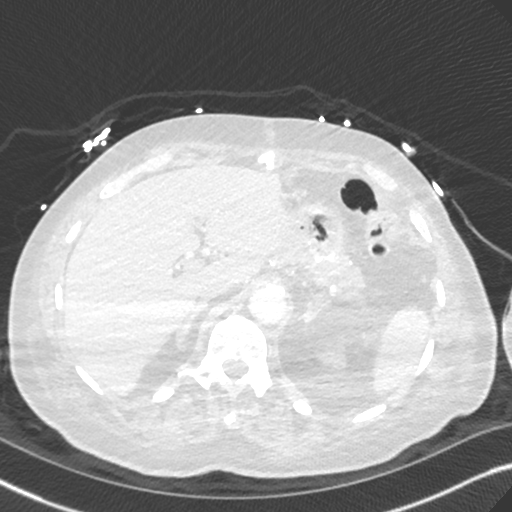
[im 48/427  soft-tissue]
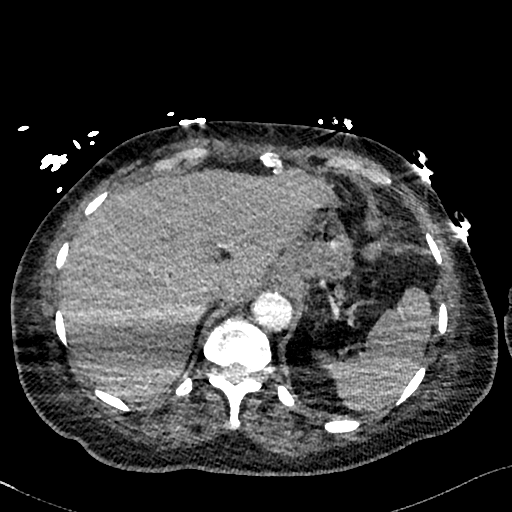
[im 72/427  lung]
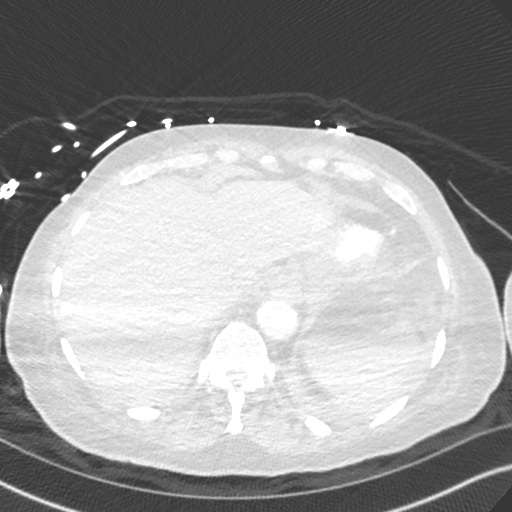
[im 95/427  soft-tissue]
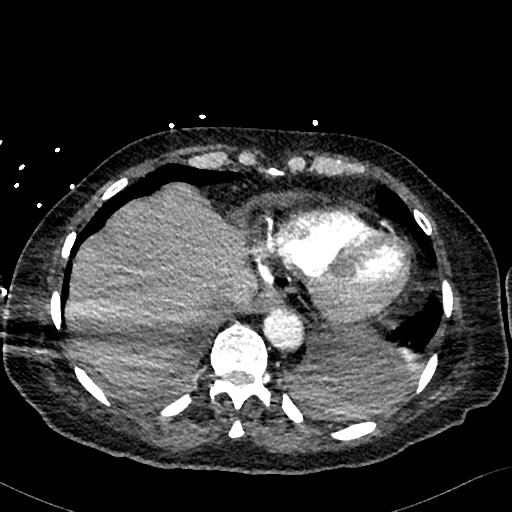
[im 143/427  lung]
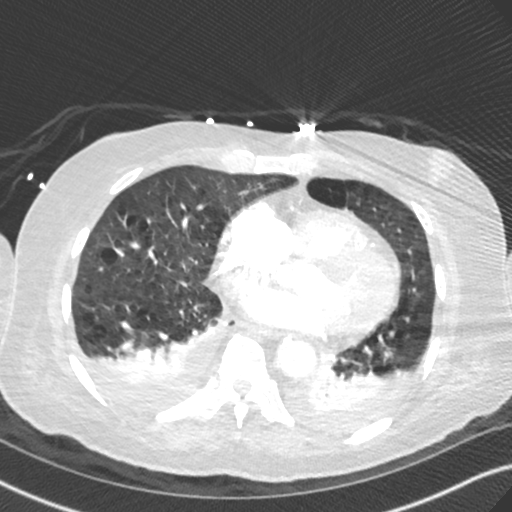
[im 166/427  soft-tissue]
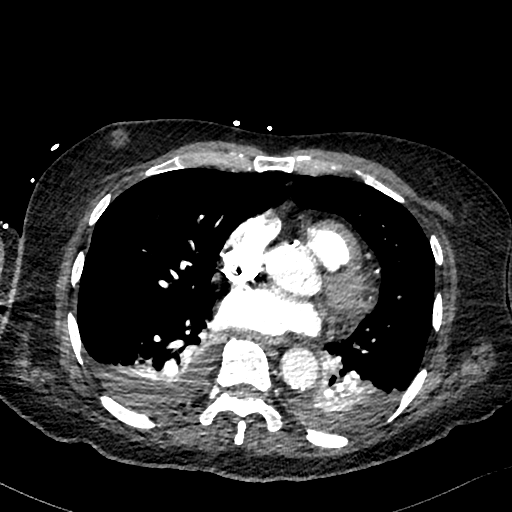
[im 190/427  lung]
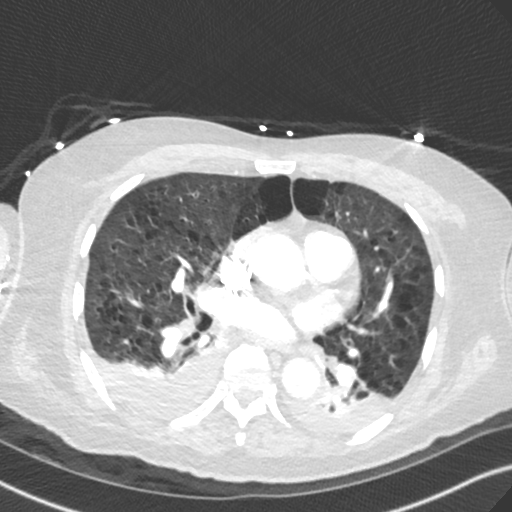
[im 214/427  soft-tissue]
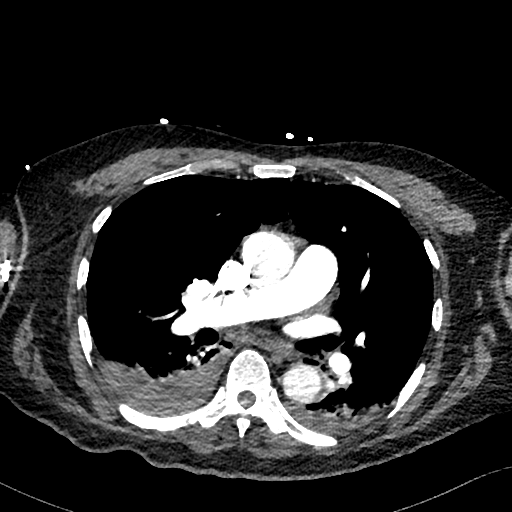
[im 237/427  lung]
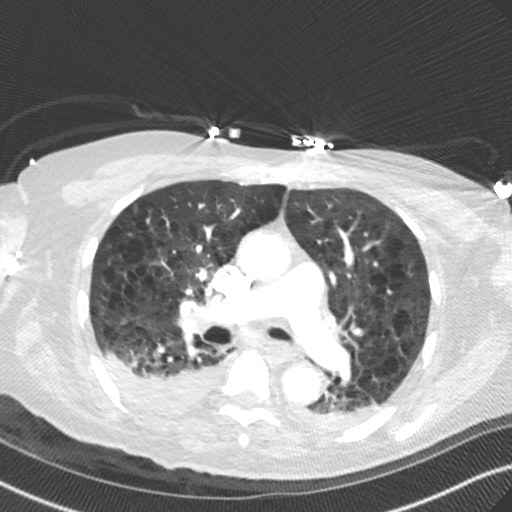
[im 261/427  soft-tissue]
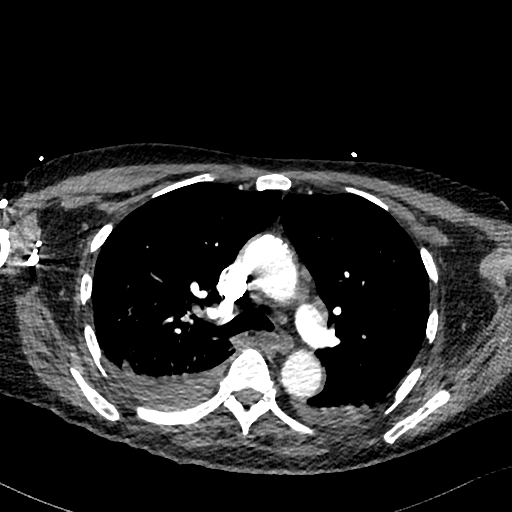
[im 285/427  lung]
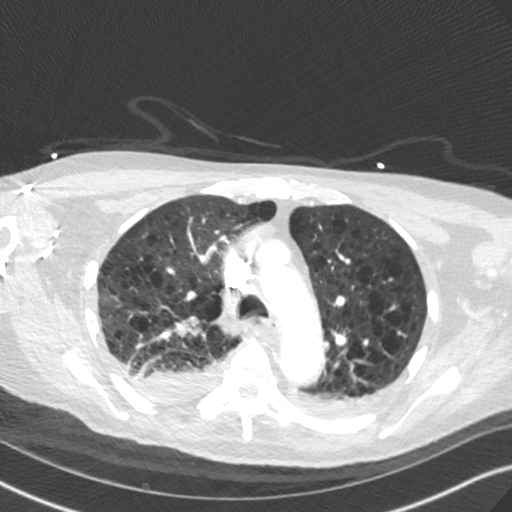
[im 332/427  soft-tissue]
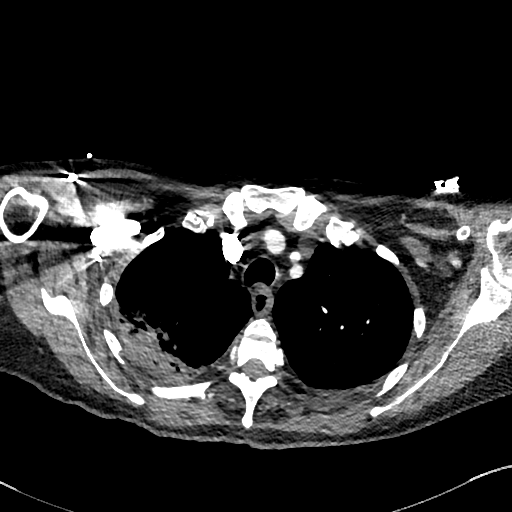
[im 356/427  lung]
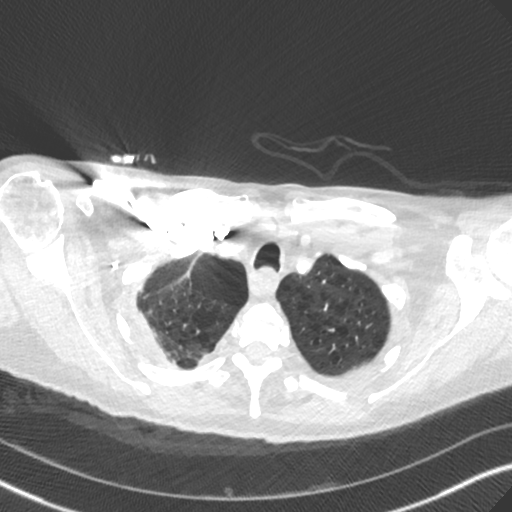
[im 379/427  soft-tissue]
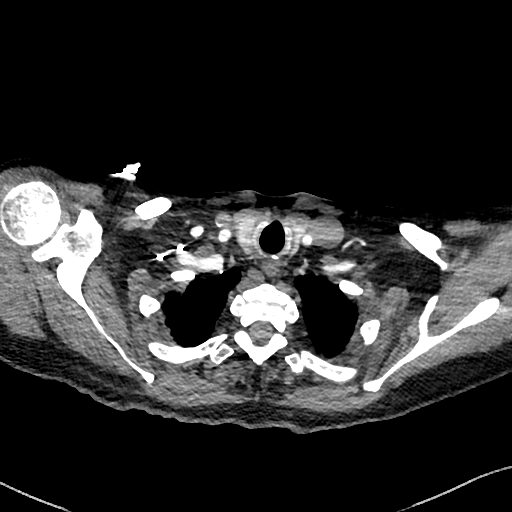
[im 403/427  lung]
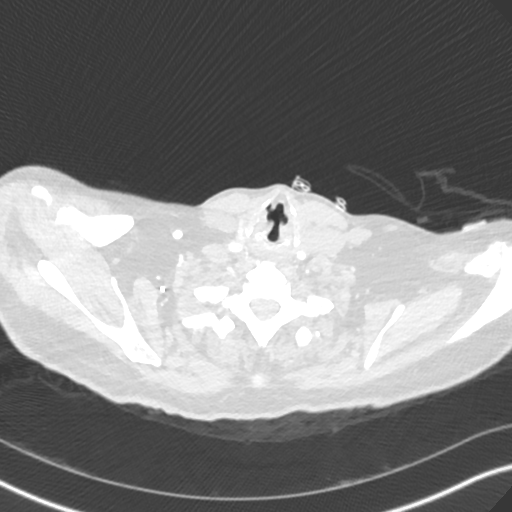

[Series 8: cor · coronal · 0.59mm/px · 3 of 132 slices shown]
[im 33/132  soft-tissue]
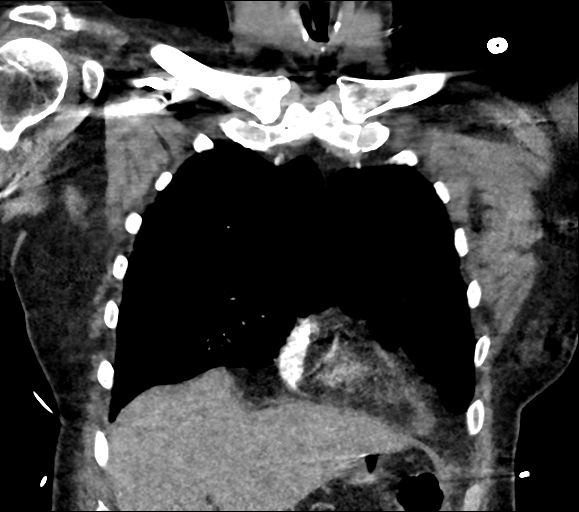
[im 66/132  soft-tissue]
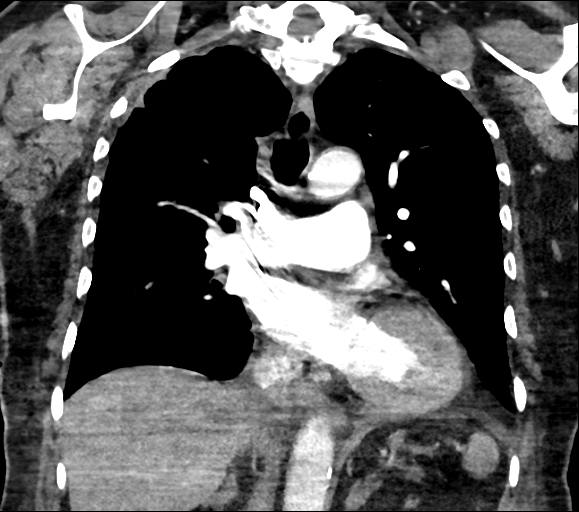
[im 99/132  soft-tissue]
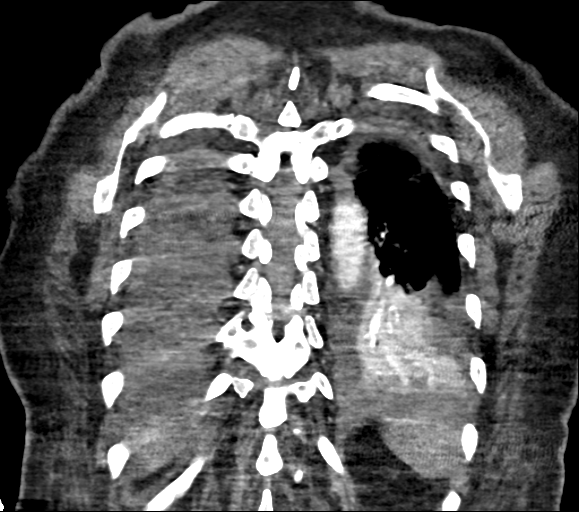

[18 of 46 positions shown; findings below may reference images not displayed]

FINDINGS: Cardiovascular: Evaluation is somewhat limited by motion artifact
and streak artifact from the patient's arms. Given this limitation,
no definite PE identified on today's exam. Coronary artery
calcifications are noted. Atherosclerotic changes are noted of the
thoracic aorta. The heart size is not significantly enlarged.

Mediastinum/Nodes: No enlarged mediastinal, hilar, or axillary lymph
nodes. Thyroid gland, trachea, and esophagus demonstrate no
significant findings.

Lungs/Pleura: Evaluation is limited by motion artifact. Again
identified are extensive emphysematous changes bilaterally. There is
chronic scarring at the right lung apex. There is atelectasis
involving the bilateral lower lobes with near complete collapse of
the left lower lobe. The trachea is unremarkable. There are small
bilateral pleural effusions.

Upper Abdomen: The patient appears to be status post prior gastric
bypass. The remaining portions of the partially visualized upper
abdomen are grossly unremarkable.

Musculoskeletal: No chest wall abnormality. No acute or significant
osseous findings. Bilateral gynecomastia is noted.

Review of the MIP images confirms the above findings.
IMPRESSION: 1. Examination is limited by motion artifact and streak artifact
from the patient's arms.
2. Given the limitations described above, no PE identified.
Detection of pulmonary emboli at the segmental and subsegmental
levels is severely limited.
3. Severe emphysematous changes. Stable scarring in the right upper
lobe.
4. Small bilateral pleural effusions with adjacent atelectasis.

Aortic Atherosclerosis (4HPGX-6NQ.Q) and Emphysema ([id number]-6UH.H).
